# Patient Record
Sex: Female | Born: 1950 | Hispanic: No | Marital: Married | Smoking: Never smoker
Health system: Southern US, Community
[De-identification: ages and names within clinical notes are randomized; demographics above are authoritative.]

## PROBLEM LIST (undated history)

## (undated) DIAGNOSIS — I10 Essential (primary) hypertension: Secondary | ICD-10-CM

## (undated) DIAGNOSIS — M1990 Unspecified osteoarthritis, unspecified site: Secondary | ICD-10-CM

## (undated) DIAGNOSIS — J302 Other seasonal allergic rhinitis: Secondary | ICD-10-CM

## (undated) DIAGNOSIS — M8580 Other specified disorders of bone density and structure, unspecified site: Secondary | ICD-10-CM

## (undated) DIAGNOSIS — K219 Gastro-esophageal reflux disease without esophagitis: Secondary | ICD-10-CM

## (undated) HISTORY — DX: Gastro-esophageal reflux disease without esophagitis: K21.9

## (undated) HISTORY — DX: Other specified disorders of bone density and structure, unspecified site: M85.80

## (undated) HISTORY — DX: Essential (primary) hypertension: I10

## (undated) HISTORY — DX: Other seasonal allergic rhinitis: J30.2

## (undated) HISTORY — PX: TUBAL LIGATION: SHX77

## (undated) HISTORY — DX: Unspecified osteoarthritis, unspecified site: M19.90

---

## 2000-01-20 ENCOUNTER — Encounter: Payer: Self-pay | Admitting: Occupational Medicine

## 2000-01-20 ENCOUNTER — Encounter: Admission: RE | Admit: 2000-01-20 | Discharge: 2000-01-20 | Payer: Self-pay | Admitting: Occupational Medicine

## 2002-06-15 ENCOUNTER — Encounter: Payer: Self-pay | Admitting: Occupational Medicine

## 2002-06-15 ENCOUNTER — Encounter: Admission: RE | Admit: 2002-06-15 | Discharge: 2002-06-15 | Payer: Self-pay | Admitting: Occupational Medicine

## 2004-12-17 ENCOUNTER — Encounter: Admission: RE | Admit: 2004-12-17 | Discharge: 2004-12-17 | Payer: Self-pay | Admitting: Obstetrics and Gynecology

## 2006-04-06 ENCOUNTER — Encounter: Admission: RE | Admit: 2006-04-06 | Discharge: 2006-04-06 | Payer: Self-pay | Admitting: Obstetrics and Gynecology

## 2007-05-09 ENCOUNTER — Encounter: Admission: RE | Admit: 2007-05-09 | Discharge: 2007-05-09 | Payer: Self-pay | Admitting: Obstetrics and Gynecology

## 2008-04-08 ENCOUNTER — Encounter: Admission: RE | Admit: 2008-04-08 | Discharge: 2008-04-08 | Payer: Self-pay | Admitting: Family Medicine

## 2008-06-17 ENCOUNTER — Encounter: Admission: RE | Admit: 2008-06-17 | Discharge: 2008-06-17 | Payer: Self-pay | Admitting: Obstetrics and Gynecology

## 2008-08-24 DIAGNOSIS — I83813 Varicose veins of bilateral lower extremities with pain: Secondary | ICD-10-CM | POA: Insufficient documentation

## 2009-07-16 ENCOUNTER — Encounter: Admission: RE | Admit: 2009-07-16 | Discharge: 2009-07-16 | Payer: Self-pay | Admitting: Family Medicine

## 2010-08-16 ENCOUNTER — Encounter: Payer: Self-pay | Admitting: Family Medicine

## 2010-08-16 ENCOUNTER — Encounter: Payer: Self-pay | Admitting: Occupational Medicine

## 2010-08-16 ENCOUNTER — Encounter: Payer: Self-pay | Admitting: Obstetrics and Gynecology

## 2010-09-17 ENCOUNTER — Other Ambulatory Visit: Payer: Self-pay | Admitting: Family Medicine

## 2010-09-17 DIAGNOSIS — Z1231 Encounter for screening mammogram for malignant neoplasm of breast: Secondary | ICD-10-CM

## 2010-09-25 ENCOUNTER — Ambulatory Visit: Payer: Self-pay

## 2010-09-28 ENCOUNTER — Ambulatory Visit
Admission: RE | Admit: 2010-09-28 | Discharge: 2010-09-28 | Disposition: A | Discharge status: Home / Self Care | Payer: Federal, State, Local not specified - PPO | Source: Ambulatory Visit | Attending: Family Medicine | Admitting: Family Medicine

## 2010-09-28 DIAGNOSIS — Z1231 Encounter for screening mammogram for malignant neoplasm of breast: Secondary | ICD-10-CM

## 2011-07-05 ENCOUNTER — Encounter: Payer: Self-pay | Admitting: Family Medicine

## 2011-09-27 ENCOUNTER — Telehealth: Payer: Self-pay

## 2011-09-27 NOTE — Telephone Encounter (Signed)
PT IS REQUESTING A REFILL ON THE NASAL SPRAY THAT DR Hal Hope PRESCRIBED LAST YEAR. PLEASE CALL PT ON HER CELL @ [phone] [facility] [address]

## 2011-09-28 NOTE — Telephone Encounter (Signed)
LMOM THAT PT NEEDS TO RTC. PT MISSED APPT IN DEC. AND ALSO ON LAST RX SHE WAS TOLD TO MAKE APPT FOR CPE

## 2011-11-08 ENCOUNTER — Encounter: Payer: Self-pay | Admitting: Family Medicine

## 2011-11-08 ENCOUNTER — Telehealth: Payer: Self-pay

## 2011-11-08 MED ORDER — FLUTICASONE PROPIONATE 50 MCG/ACT NA SUSP: 2.0000 | Freq: Every day | NASAL | Status: DC

## 2011-11-08 NOTE — Telephone Encounter (Signed)
STOPPED BY 104. WOULD LIKE A REFILL ON FLUTICASONE, PROPIONATE NASAL SPRAY. HAS APPT TO SEE DR ON 4/29   [facility] [address]

## 2011-11-08 NOTE — Telephone Encounter (Signed)
I will refill her FLonase for her, please note what pharmacy she uses and we will eprescribe.

## 2011-11-08 NOTE — Telephone Encounter (Signed)
Please tell pt Flonase sent to her pharamcy, thanks

## 2011-11-09 NOTE — Telephone Encounter (Signed)
LMOM notifying patient.

## 2011-11-22 ENCOUNTER — Encounter: Payer: Self-pay | Admitting: Family Medicine

## 2011-11-22 ENCOUNTER — Ambulatory Visit (INDEPENDENT_AMBULATORY_CARE_PROVIDER_SITE_OTHER): Payer: Federal, State, Local not specified - PPO | Admitting: Family Medicine

## 2011-11-22 VITALS — BP 128/67 | HR 89 | Temp 98.7°F | Resp 16 | Ht 63.0 in | Wt 183.4 lb

## 2011-11-22 DIAGNOSIS — J302 Other seasonal allergic rhinitis: Secondary | ICD-10-CM

## 2011-11-22 DIAGNOSIS — H0520 Unspecified exophthalmos: Secondary | ICD-10-CM

## 2011-11-22 DIAGNOSIS — I10 Essential (primary) hypertension: Secondary | ICD-10-CM

## 2011-11-22 DIAGNOSIS — Z0000 Encounter for general adult medical examination without abnormal findings: Secondary | ICD-10-CM

## 2011-11-22 LAB — LIPID PANEL
HDL: 59 mg/dL (ref >39)
LDL Cholesterol: 75 mg/dL (ref 0–99)
Total CHOL/HDL Ratio: 2.5 Ratio
VLDL: 13 mg/dL (ref 0–40)

## 2011-11-22 LAB — COMPREHENSIVE METABOLIC PANEL
ALT: 16 U/L (ref 0–35)
AST: 18 U/L (ref 0–37)
Alkaline Phosphatase: 54 U/L (ref 39–117)
BUN: 20 mg/dL (ref 6–23)
Calcium: 9.6 mg/dL (ref 8.4–10.5)
Chloride: 104 mEq/L (ref 96–112)
Creat: 0.82 mg/dL (ref 0.50–1.10)
Total Bilirubin: 0.3 mg/dL (ref 0.3–1.2)

## 2011-11-22 LAB — CBC WITH DIFFERENTIAL/PLATELET
Basophils Absolute: 0 10*3/uL (ref 0.0–0.1)
Basophils Relative: 0 % (ref 0–1)
Eosinophils Relative: 1 % (ref 0–5)
Lymphocytes Relative: 35 % (ref 12–46)
MCHC: 33.1 g/dL (ref 30.0–36.0)
MCV: 89.8 fL (ref 78.0–100.0)
Neutro Abs: 3.5 10*3/uL (ref 1.7–7.7)
Platelets: 313 10*3/uL (ref 150–400)
RDW: 13.2 % (ref 11.5–15.5)
WBC: 6.2 10*3/uL (ref 4.0–10.5)

## 2011-11-22 LAB — TSH: TSH: 1.508 u[IU]/mL (ref 0.350–4.500)

## 2011-11-22 MED ORDER — FLUTICASONE PROPIONATE 50 MCG/ACT NA SUSP: 2.0000 | Freq: Every day | NASAL | Status: DC

## 2011-11-22 MED ORDER — LISINOPRIL-HYDROCHLOROTHIAZIDE 10-12.5 MG PO TABS: 1.0000 | ORAL_TABLET | Freq: Every day | ORAL | Status: DC

## 2011-11-22 NOTE — Progress Notes (Signed)
  Subjective:    Patient ID: Frances Hunter, female    DOB: Mar 23, 1951, 61 y.o.   MRN: [id number]  HPI Patient presents for CPE  ETD/Tinnitus- much improved with Flonase  Hypertension- compliant with medications without side effects  Varicose veins- laser procedure helping  Health Maintenance:  Pap 2012 normal Mammogram 2012 normal Colonoscopy 2010- normal   Review of Systems     Objective:   Physical Exam  Constitutional: She appears well-developed.  HENT:  Head: Normocephalic and atraumatic.  Right Ear: Hearing, tympanic membrane, external ear and ear canal normal.  Left Ear: Hearing, tympanic membrane, external ear and ear canal normal.  Nose: Mucosal edema present.  Mouth/Throat: Oropharynx is clear and moist.  Eyes: Conjunctivae and EOM are normal.  Neck: Normal range of motion. Neck supple. No thyromegaly present.  Cardiovascular: Normal rate, regular rhythm, normal heart sounds and intact distal pulses.   Pulmonary/Chest: Effort normal and breath sounds normal.  Abdominal: Soft. Bowel sounds are normal.  Musculoskeletal: Normal range of motion.  Lymphadenopathy:    She has no cervical adenopathy.  Neurological: She is alert. She displays normal reflexes. No cranial nerve deficit.  Skin: Skin is warm.  Psychiatric: She has a normal mood and affect.        Assessment & Plan:   1. Routine general medical examination at a health care facility    2. HTN (hypertension)  Continue current medications  3. Exophthalmos  TSH has remained normal  4. Seasonal allergies  fluticasone (FLONASE) 50 MCG/ACT nasal spray   Follow up 6 months Zostavax Rx

## 2011-11-25 ENCOUNTER — Other Ambulatory Visit: Payer: Self-pay | Admitting: Family Medicine

## 2011-11-25 DIAGNOSIS — Z1231 Encounter for screening mammogram for malignant neoplasm of breast: Secondary | ICD-10-CM

## 2011-11-26 ENCOUNTER — Ambulatory Visit
Admission: RE | Admit: 2011-11-26 | Discharge: 2011-11-26 | Disposition: A | Discharge status: Home / Self Care | Payer: Federal, State, Local not specified - PPO | Source: Ambulatory Visit | Attending: Family Medicine | Admitting: Family Medicine

## 2011-11-26 DIAGNOSIS — Z1231 Encounter for screening mammogram for malignant neoplasm of breast: Secondary | ICD-10-CM

## 2012-06-01 ENCOUNTER — Other Ambulatory Visit: Payer: Self-pay | Admitting: Internal Medicine

## 2012-12-10 ENCOUNTER — Ambulatory Visit (INDEPENDENT_AMBULATORY_CARE_PROVIDER_SITE_OTHER): Payer: Federal, State, Local not specified - PPO | Admitting: Family Medicine

## 2012-12-10 ENCOUNTER — Telehealth: Payer: Self-pay

## 2012-12-10 VITALS — BP 142/76 | HR 89 | Temp 98.4°F | Resp 16 | Ht 63.0 in | Wt 193.4 lb

## 2012-12-10 DIAGNOSIS — J302 Other seasonal allergic rhinitis: Secondary | ICD-10-CM | POA: Insufficient documentation

## 2012-12-10 DIAGNOSIS — I10 Essential (primary) hypertension: Secondary | ICD-10-CM | POA: Insufficient documentation

## 2012-12-10 DIAGNOSIS — J309 Allergic rhinitis, unspecified: Secondary | ICD-10-CM

## 2012-12-10 DIAGNOSIS — K219 Gastro-esophageal reflux disease without esophagitis: Secondary | ICD-10-CM

## 2012-12-10 MED ORDER — OMEPRAZOLE 40 MG PO CPDR: 40.0000 mg | DELAYED_RELEASE_CAPSULE | Freq: Every day | ORAL | Status: DC

## 2012-12-10 MED ORDER — FLUTICASONE PROPIONATE 50 MCG/ACT NA SUSP: NASAL | Status: DC

## 2012-12-10 MED ORDER — LISINOPRIL-HYDROCHLOROTHIAZIDE 10-12.5 MG PO TABS: 1.0000 | ORAL_TABLET | Freq: Every day | ORAL | Status: DC

## 2012-12-10 NOTE — Progress Notes (Signed)
  Subjective:    Patient ID: Frances Hunter, female    DOB: Mar 01, 1951, 62 y.o.   MRN: [id number] Chief Complaint  Patient presents with  . Medication Refill   HPI  Ran out of BP meds 3-4d ago - was out of town last week so that's why she did not come in until now.  Does not check BP outside of office.    Needs to get physical and plans to schedule soon.  Gets her primary care here - was seeing Dr. Francine Graven  Is noticing some metallic taste in her mouth, taking some otc meds for heartburn which helps a little.  Using flonase - works really well for her allergies including stuffy nose and scratchy throat.  Past Medical History  Diagnosis Date  . Allergy   . Hypertension    No current outpatient prescriptions on file prior to visit.   No current facility-administered medications on file prior to visit.   No Known Allergies   Review of Systems  Constitutional: Negative for fever, chills, diaphoresis, activity change and appetite change.  Eyes: Negative for visual disturbance.  Respiratory: Negative for cough and shortness of breath.   Cardiovascular: Negative for chest pain, palpitations and leg swelling.  Genitourinary: Negative for decreased urine volume.  Neurological: Negative for syncope and headaches.  Hematological: Does not bruise/bleed easily.      BP 142/76  Pulse 89  Temp(Src) 98.4 F (36.9 C) (Oral)  Resp 16  Ht 5\' 3"  (1.6 m)  Wt 193 lb 6.4 oz (87.726 kg)  BMI 34.27 kg/m2  SpO2 98% Objective:   Physical Exam  Constitutional: She is oriented to person, place, and time. She appears well-developed and well-nourished. No distress.  HENT:  Head: Normocephalic and atraumatic.  Right Ear: External ear normal.  Left Ear: External ear normal.  Eyes: Conjunctivae are normal. No scleral icterus.  Neck: Normal range of motion. Neck supple. No thyromegaly present.  Cardiovascular: Normal rate, regular rhythm, normal heart sounds and intact distal pulses.    Pulmonary/Chest: Effort normal and breath sounds normal. No respiratory distress.  Musculoskeletal: She exhibits no edema.  Lymphadenopathy:    She has no cervical adenopathy.  Neurological: She is alert and oriented to person, place, and time.  Skin: Skin is warm and dry. She is not diaphoretic. No erythema.  Psychiatric: She has a normal mood and affect. Her behavior is normal.      Assessment & Plan:  GERD (gastroesophageal reflux disease) - start ppi x 4-6 wks then stop  HTN - restart lisinopril-hctz - recheck in 3 mos w/ bmp at that time  Allergic rhinitis - refilled flonase  HM - sched CPE to establish w/ me in 3 mos.  Meds ordered this encounter  Medications  . lisinopril-hydrochlorothiazide (PRINZIDE,ZESTORETIC) 10-12.5 MG per tablet    Sig: Take 1 tablet by mouth daily.    Dispense:  90 tablet    Refill:  1  . omeprazole (PRILOSEC) 40 MG capsule    Sig: Take 1 capsule (40 mg total) by mouth daily.    Dispense:  30 capsule    Refill:  1  . fluticasone (FLONASE) 50 MCG/ACT nasal spray    Sig: PLACE 2 SPRAYS INTO THE NOSE DAILY.    Dispense:  16 g    Refill:  6

## 2013-01-05 ENCOUNTER — Other Ambulatory Visit: Payer: Self-pay

## 2013-01-05 DIAGNOSIS — Z1231 Encounter for screening mammogram for malignant neoplasm of breast: Secondary | ICD-10-CM

## 2013-02-05 ENCOUNTER — Ambulatory Visit
Admission: RE | Admit: 2013-02-05 | Discharge: 2013-02-05 | Disposition: A | Discharge status: Home / Self Care | Payer: Federal, State, Local not specified - PPO | Source: Ambulatory Visit

## 2013-02-05 DIAGNOSIS — Z1231 Encounter for screening mammogram for malignant neoplasm of breast: Secondary | ICD-10-CM

## 2013-02-23 ENCOUNTER — Ambulatory Visit (INDEPENDENT_AMBULATORY_CARE_PROVIDER_SITE_OTHER): Payer: Federal, State, Local not specified - PPO | Admitting: Family Medicine

## 2013-02-23 ENCOUNTER — Encounter: Payer: Self-pay | Admitting: Family Medicine

## 2013-02-23 VITALS — BP 121/70 | HR 110 | Temp 98.2°F | Resp 18 | Ht 63.5 in | Wt 187.0 lb

## 2013-02-23 DIAGNOSIS — Z0000 Encounter for general adult medical examination without abnormal findings: Secondary | ICD-10-CM

## 2013-02-23 DIAGNOSIS — I10 Essential (primary) hypertension: Secondary | ICD-10-CM

## 2013-02-23 LAB — POCT URINALYSIS DIPSTICK
Leukocytes, UA: NEGATIVE
Nitrite, UA: NEGATIVE
Protein, UA: NEGATIVE
Urobilinogen, UA: 0.2
pH, UA: 7.5

## 2013-02-23 LAB — COMPREHENSIVE METABOLIC PANEL
Albumin: 4.2 g/dL (ref 3.5–5.2)
BUN: 14 mg/dL (ref 6–23)
CO2: 32 mEq/L (ref 19–32)
Glucose, Bld: 80 mg/dL (ref 70–99)
Potassium: 3.9 mEq/L (ref 3.5–5.3)
Sodium: 140 mEq/L (ref 135–145)
Total Bilirubin: 0.4 mg/dL (ref 0.3–1.2)
Total Protein: 7.2 g/dL (ref 6.0–8.3)

## 2013-02-23 LAB — POCT WET PREP WITH KOH
KOH Prep POC: NEGATIVE
Trichomonas, UA: NEGATIVE

## 2013-02-23 LAB — CBC WITH DIFFERENTIAL/PLATELET
Basophils Relative: 1 % (ref 0–1)
Eosinophils Absolute: 0 10*3/uL (ref 0.0–0.7)
HCT: 38.3 % (ref 36.0–46.0)
Hemoglobin: 12.9 g/dL (ref 12.0–15.0)
Lymphs Abs: 2 10*3/uL (ref 0.7–4.0)
MCH: 29.8 pg (ref 26.0–34.0)
MCHC: 33.7 g/dL (ref 30.0–36.0)
Monocytes Absolute: 0.4 10*3/uL (ref 0.1–1.0)
Monocytes Relative: 7 % (ref 3–12)
Neutrophils Relative %: 58 % (ref 43–77)
RBC: 4.33 MIL/uL (ref 3.87–5.11)

## 2013-02-23 LAB — IFOBT (OCCULT BLOOD): IFOBT: NEGATIVE

## 2013-02-23 MED ORDER — LISINOPRIL-HYDROCHLOROTHIAZIDE 10-12.5 MG PO TABS: 1.0000 | ORAL_TABLET | Freq: Every day | ORAL | Status: DC

## 2013-02-23 MED ORDER — OMEPRAZOLE 40 MG PO CPDR: 40.0000 mg | DELAYED_RELEASE_CAPSULE | Freq: Every day | ORAL | Status: DC

## 2013-02-23 NOTE — Progress Notes (Signed)
Subjective:    Patient ID: Frances Hunter, female    DOB: 1950-10-20, 62 y.o.   MRN: [id number] Chief Complaint  Patient presents with  . Annual Exam  . Gynecologic Exam     HPI  Taking 1 a day older women vitamins daily.  Tdap 2008  Past Medical History  Diagnosis Date  . Allergy   . Hypertension    Current Outpatient Prescriptions on File Prior to Visit  Medication Sig Dispense Refill  . fluticasone (FLONASE) 50 MCG/ACT nasal spray PLACE 2 SPRAYS INTO THE NOSE DAILY.  16 g  6   No current facility-administered medications on file prior to visit.   No Known Allergies History reviewed. No pertinent past surgical history. History   Social History  . Marital Status: Married    Spouse Name: N/A    Number of Children: N/A  . Years of Education: N/A   Social History Main Topics  . Smoking status: Never Smoker   . Smokeless tobacco: None  . Alcohol Use: 1.2 oz/week    2 Glasses of wine per week  . Drug Use: No  . Sexual Activity: Yes   Other Topics Concern  . None   Social History Narrative  . None   Family History  Problem Relation Age of Onset  . Cancer Father 2    lymphoma  . Diabetes Brother   . Diabetes Maternal Grandfather   . Cancer Paternal Grandfather   . Liver disease Mother 28    cyst     Review of Systems  HENT: Positive for neck stiffness and tinnitus.   Hematological: Bruises/bleeds easily.  All other systems reviewed and are negative.      BP 121/70  Pulse 110  Temp(Src) 98.2 F (36.8 C)  Resp 18  Ht 5' 3.5" (1.613 m)  Wt 187 lb (84.823 kg)  BMI 32.6 kg/m2 Objective:   Physical Exam  Constitutional: She is oriented to person, place, and time. She appears well-developed and well-nourished. No distress.  HENT:  Head: Normocephalic and atraumatic.  Right Ear: Tympanic membrane, external ear and ear canal normal.  Left Ear: Tympanic membrane, external ear and ear canal normal.  Nose: No rhinorrhea.  Mouth/Throat: Uvula is  midline, oropharynx is clear and moist and mucous membranes are normal. No posterior oropharyngeal erythema.  Eyes: Conjunctivae and EOM are normal. Pupils are equal, round, and reactive to light. Right eye exhibits no discharge. Left eye exhibits no discharge. No scleral icterus.  Neck: Normal range of motion. Neck supple. No thyromegaly present.  Cardiovascular: Normal rate, regular rhythm, normal heart sounds and intact distal pulses.   Pulmonary/Chest: Effort normal and breath sounds normal. No respiratory distress.  Abdominal: Soft. Bowel sounds are normal. There is no tenderness.  Genitourinary: Vagina normal and uterus normal. No breast swelling, tenderness, discharge or bleeding. Cervix exhibits no motion tenderness and no friability. Right adnexum displays no mass and no tenderness. Left adnexum displays no mass and no tenderness.  Musculoskeletal: She exhibits no edema.  Lymphadenopathy:    She has no cervical adenopathy.  Neurological: She is alert and oriented to person, place, and time. She has normal reflexes.  Skin: Skin is warm and dry. She is not diaphoretic. No erythema.  Psychiatric: She has a normal mood and affect. Her behavior is normal.          Results for orders placed in visit on 02/23/13  CBC WITH DIFFERENTIAL      Result Value Range   WBC  5.9  4.0 - 10.5 K/uL   RBC 4.33  3.87 - 5.11 MIL/uL   Hemoglobin 12.9  12.0 - 15.0 g/dL   HCT 16.1  09.6 - 04.5 %   MCV 88.5  78.0 - 100.0 fL   MCH 29.8  26.0 - 34.0 pg   MCHC 33.7  30.0 - 36.0 g/dL   RDW 40.9  81.1 - 91.4 %   Platelets 349  150 - 400 K/uL   Neutrophils Relative % 58  43 - 77 %   Neutro Abs 3.4  1.7 - 7.7 K/uL   Lymphocytes Relative 33  12 - 46 %   Lymphs Abs 2.0  0.7 - 4.0 K/uL   Monocytes Relative 7  3 - 12 %   Monocytes Absolute 0.4  0.1 - 1.0 K/uL   Eosinophils Relative 1  0 - 5 %   Eosinophils Absolute 0.0  0.0 - 0.7 K/uL   Basophils Relative 1  0 - 1 %   Basophils Absolute 0.0  0.0 - 0.1 K/uL    Smear Review Criteria for review not met    COMPREHENSIVE METABOLIC PANEL      Result Value Range   Sodium 140  135 - 145 mEq/L   Potassium 3.9  3.5 - 5.3 mEq/L   Chloride 103  96 - 112 mEq/L   CO2 32  19 - 32 mEq/L   Glucose, Bld 80  70 - 99 mg/dL   BUN 14  6 - 23 mg/dL   Creat 7.82  9.56 - 2.13 mg/dL   Total Bilirubin 0.4  0.3 - 1.2 mg/dL   Alkaline Phosphatase 63  39 - 117 U/L   AST 16  0 - 37 U/L   ALT 16  0 - 35 U/L   Total Protein 7.2  6.0 - 8.3 g/dL   Albumin 4.2  3.5 - 5.2 g/dL   Calcium 9.7  8.4 - 08.6 mg/dL  POCT URINALYSIS DIPSTICK      Result Value Range   Color, UA yellow     Clarity, UA clear     Glucose, UA neg     Bilirubin, UA neg     Ketones, UA neg     Spec Grav, UA 1.015     Blood, UA trace     pH, UA 7.5     Protein, UA neg     Urobilinogen, UA 0.2     Nitrite, UA neg     Leukocytes, UA Negative    IFOBT (OCCULT BLOOD)      Result Value Range   IFOBT Negative    POCT WET PREP WITH KOH      Result Value Range   Trichomonas, UA Negative     Clue Cells Wet Prep HPF POC 0-14     Epithelial Wet Prep HPF POC 6-tntc     Yeast Wet Prep HPF POC neg     Bacteria Wet Prep HPF POC 2+     RBC Wet Prep HPF POC 0-6     WBC Wet Prep HPF POC 13-tntc     KOH Prep POC Negative    PAP IG W/ RFLX HPV ASCU      Result Value Range   Specimen adequacy:       FINAL DIAGNOSIS:       COMMENTS:       Cytotechnologist:        Assessment & Plan:  Annual physical exam - Plan: POCT urinalysis dipstick, Pap IG  w/ reflex to HPV when ASC-U, CBC with Differential, Comprehensive metabolic panel, IFOBT POC (occult bld, rslt in office), POCT Wet Prep with KOH  Essential hypertension, benign - Plan: Comprehensive metabolic panel  Meds ordered this encounter  Medications  . lisinopril-hydrochlorothiazide (PRINZIDE,ZESTORETIC) 10-12.5 MG per tablet    Sig: Take 1 tablet by mouth daily.    Dispense:  90 tablet    Refill:  3  . omeprazole (PRILOSEC) 40 MG capsule    Sig:  Take 1 capsule (40 mg total) by mouth daily.    Dispense:  30 capsule    Refill:  1

## 2013-02-23 NOTE — Patient Instructions (Addendum)

## 2013-02-26 LAB — PAP IG W/ RFLX HPV ASCU

## 2013-03-05 ENCOUNTER — Encounter: Payer: Self-pay | Admitting: Family Medicine

## 2014-01-25 ENCOUNTER — Ambulatory Visit (INDEPENDENT_AMBULATORY_CARE_PROVIDER_SITE_OTHER): Payer: Federal, State, Local not specified - PPO | Admitting: Emergency Medicine

## 2014-01-25 VITALS — BP 138/70 | HR 94 | Temp 98.2°F | Resp 18 | Ht 62.5 in | Wt 188.8 lb

## 2014-01-25 DIAGNOSIS — M545 Low back pain, unspecified: Secondary | ICD-10-CM

## 2014-01-25 DIAGNOSIS — R1013 Epigastric pain: Secondary | ICD-10-CM

## 2014-01-25 DIAGNOSIS — N3000 Acute cystitis without hematuria: Secondary | ICD-10-CM

## 2014-01-25 LAB — POCT URINALYSIS DIPSTICK
BILIRUBIN UA: NEGATIVE
GLUCOSE UA: NEGATIVE
Ketones, UA: NEGATIVE
NITRITE UA: NEGATIVE
Protein, UA: NEGATIVE
Spec Grav, UA: <=1.005
UROBILINOGEN UA: 0.2
pH, UA: 6.5

## 2014-01-25 LAB — POCT UA - MICROSCOPIC ONLY
Bacteria, U Microscopic: NEGATIVE
Casts, Ur, LPF, POC: NEGATIVE
Crystals, Ur, HPF, POC: NEGATIVE
Mucus, UA: NEGATIVE
YEAST UA: NEGATIVE

## 2014-01-25 MED ORDER — PHENAZOPYRIDINE HCL 200 MG PO TABS: 200.0000 mg | ORAL_TABLET | Freq: Three times a day (TID) | ORAL | Status: DC | PRN

## 2014-01-25 MED ORDER — SULFAMETHOXAZOLE-TMP DS 800-160 MG PO TABS: 1.0000 | ORAL_TABLET | Freq: Two times a day (BID) | ORAL | Status: DC

## 2014-01-25 NOTE — Progress Notes (Signed)
[facility] [address], [place] [address] [phone]  Date:  01/25/2014   Name:  Frances Hunter   DOB:  02/25/1951   MRN:  [id number]  PCP:  Norberto SorensonSHAW,EVA, MD    Chief Complaint: Bloated, Back Pain and Gastrophageal Reflux   History of Present Illness:  Frances Hunter is a 63 y.o. very pleasant female patient who presents with the following:  Patient has concerns that her GERD is acting up.  She says she has low back pain and lower abdominal pain. She has no dysuria, urgency, or frequency. No nausea or vomiting, stool change, food intolerance.  She describes a feeling of bloating.  No gyn symptoms.  Post menopausal.  The patient has no complaint of blood, mucous, or pus in her stools. No black stools or vomiting blood. She does have some symptoms of waterbrash.  Takes her medication daily. No improvement with over the counter medications or other home remedies. Denies other complaint or health concern today.   Patient Active Problem List   Diagnosis Date Noted  . Essential hypertension, benign 12/10/2012  . Allergic rhinitis 12/10/2012  . GERD (gastroesophageal reflux disease) 12/10/2012    Past Medical History  Diagnosis Date  . Allergy   . Hypertension     History reviewed. No pertinent past surgical history.  History  Substance Use Topics  . Smoking status: Never Smoker   . Smokeless tobacco: Not on file  . Alcohol Use: 1.2 oz/week    2 Glasses of wine per week    Family History  Problem Relation Age of Onset  . Cancer Father 2353    lymphoma  . Diabetes Brother   . Diabetes Maternal Grandfather   . Cancer Paternal Grandfather   . Liver disease Mother [id number]    cyst    No Known Allergies  Medication list has been reviewed and updated.  Current Outpatient Prescriptions on File Prior to Visit  Medication Sig Dispense Refill  . fluticasone (FLONASE) 50 MCG/ACT nasal spray PLACE 2 SPRAYS INTO THE NOSE DAILY.  16 g  6  .  lisinopril-hydrochlorothiazide (PRINZIDE,ZESTORETIC) 10-12.5 MG per tablet Take 1 tablet by mouth daily.  90 tablet  3  . omeprazole (PRILOSEC) 40 MG capsule Take 1 capsule (40 mg total) by mouth daily.  30 capsule  1   No current facility-administered medications on file prior to visit.    Review of Systems:  As per HPI, otherwise negative.    Physical Examination: Filed Vitals:   01/25/14 1626  BP: 138/70  Pulse: 94  Temp: 98.2 F (36.8 C)  Resp: 18   Filed Vitals:   01/25/14 1626  Height: 5' 2.5" (1.588 m)  Weight: 188 lb 12.8 oz (85.639 kg)   Body mass index is 33.96 kg/(m^2). Ideal Body Weight: Weight in (lb) to have BMI = 25: 138.6  GEN: WDWN, NAD, Non-toxic, A & O x 3 HEENT: Atraumatic, Normocephalic. Neck supple. No masses, No LAD. Ears and Nose: No external deformity. CV: RRR, No M/G/R. No JVD. No thrill. No extra heart sounds. PULM: CTA B, no wheezes, crackles, rhonchi. No retractions. No resp. distress. No accessory muscle use. ABD: S, NT, ND, +BS. No rebound. No HSM. EXTR: No c/c/e NEURO Normal gait.  PSYCH: Normally interactive. Conversant. Not depressed or anxious appearing.  Calm demeanor.    Assessment and Plan: Cystitis Pyridium Septra  Signed,  Phillips OdorJeffery Zandria Woldt, MD   Results for orders placed in visit on 01/25/14  POCT UA - MICROSCOPIC ONLY      Result Value Ref Range   WBC, Ur, HPF, POC 0-3     RBC, urine, microscopic 0-2     Bacteria, U Microscopic neg     Mucus, UA neg     Epithelial cells, urine per micros 0-1     Crystals, Ur, HPF, POC neg     Casts, Ur, LPF, POC neg     Yeast, UA neg    POCT URINALYSIS DIPSTICK      Result Value Ref Range   Color, UA yellow     Clarity, UA clear     Glucose, UA neg     Bilirubin, UA neg     Ketones, UA neg     Spec Grav, UA <=1.005     Blood, UA trace     pH, UA 6.5     Protein, UA neg     Urobilinogen, UA 0.2     Nitrite, UA neg     Leukocytes, UA small (1+)

## 2014-01-25 NOTE — Patient Instructions (Signed)

## 2014-01-26 LAB — COMPREHENSIVE METABOLIC PANEL
ALBUMIN: 4.2 g/dL (ref 3.5–5.2)
ALK PHOS: 53 U/L (ref 39–117)
ALT: 18 U/L (ref 0–35)
AST: 16 U/L (ref 0–37)
BUN: 15 mg/dL (ref 6–23)
CALCIUM: 9.7 mg/dL (ref 8.4–10.5)
CHLORIDE: 102 meq/L (ref 96–112)
CO2: 31 mEq/L (ref 19–32)
CREATININE: 0.85 mg/dL (ref 0.50–1.10)
Glucose, Bld: 86 mg/dL (ref 70–99)
POTASSIUM: 3.4 meq/L — AB (ref 3.5–5.3)
Sodium: 141 mEq/L (ref 135–145)
Total Bilirubin: 0.4 mg/dL (ref 0.2–1.2)
Total Protein: 7.2 g/dL (ref 6.0–8.3)

## 2014-01-26 LAB — CBC
HCT: 37.9 % (ref 36.0–46.0)
Hemoglobin: 12.8 g/dL (ref 12.0–15.0)
MCH: 29.4 pg (ref 26.0–34.0)
MCHC: 33.8 g/dL (ref 30.0–36.0)
MCV: 86.9 fL (ref 78.0–100.0)
PLATELETS: 289 10*3/uL (ref 150–400)
RBC: 4.36 MIL/uL (ref 3.87–5.11)
RDW: 13.7 % (ref 11.5–15.5)
WBC: 6.7 10*3/uL (ref 4.0–10.5)

## 2014-01-26 LAB — AMYLASE: Amylase: 59 U/L (ref 0–105)

## 2014-01-28 LAB — H. PYLORI ANTIBODY, IGG: H Pylori IgG: <0.4 {ISR}

## 2014-03-05 ENCOUNTER — Other Ambulatory Visit: Payer: Self-pay | Admitting: Family Medicine

## 2014-04-10 ENCOUNTER — Telehealth: Payer: Self-pay

## 2014-04-10 MED ORDER — LISINOPRIL-HYDROCHLOROTHIAZIDE 10-12.5 MG PO TABS: 1.0000 | ORAL_TABLET | Freq: Every day | ORAL | Status: DC

## 2014-04-10 NOTE — Telephone Encounter (Signed)
Pt advised refill sent to the pharmacy. 

## 2014-04-10 NOTE — Telephone Encounter (Signed)
Patient came into the appointment center stated she need a refill on Lisinopril 10-12.5 MG until September 23. She has a scheduled appointment with Dr. Clelia Croft on 05-17-14. Please call patient when prescription is sent over to pharmacy. [phone]

## 2014-05-06 ENCOUNTER — Other Ambulatory Visit: Payer: Self-pay

## 2014-05-06 DIAGNOSIS — Z1239 Encounter for other screening for malignant neoplasm of breast: Secondary | ICD-10-CM

## 2014-05-07 ENCOUNTER — Other Ambulatory Visit: Payer: Self-pay | Admitting: Physician Assistant

## 2014-05-14 ENCOUNTER — Other Ambulatory Visit: Payer: Self-pay

## 2014-05-14 DIAGNOSIS — Z1231 Encounter for screening mammogram for malignant neoplasm of breast: Secondary | ICD-10-CM

## 2014-05-16 ENCOUNTER — Ambulatory Visit
Admission: RE | Admit: 2014-05-16 | Discharge: 2014-05-16 | Disposition: A | Discharge status: Home / Self Care | Payer: Federal, State, Local not specified - PPO | Source: Ambulatory Visit

## 2014-05-16 DIAGNOSIS — Z1231 Encounter for screening mammogram for malignant neoplasm of breast: Secondary | ICD-10-CM

## 2014-05-17 ENCOUNTER — Encounter: Payer: Self-pay | Admitting: Family Medicine

## 2014-05-17 ENCOUNTER — Ambulatory Visit (INDEPENDENT_AMBULATORY_CARE_PROVIDER_SITE_OTHER): Payer: Federal, State, Local not specified - PPO | Admitting: Family Medicine

## 2014-05-17 VITALS — BP 132/70 | HR 87 | Temp 98.1°F | Resp 16 | Ht 63.5 in | Wt 186.2 lb

## 2014-05-17 DIAGNOSIS — I10 Essential (primary) hypertension: Secondary | ICD-10-CM

## 2014-05-17 DIAGNOSIS — Z0000 Encounter for general adult medical examination without abnormal findings: Secondary | ICD-10-CM

## 2014-05-17 DIAGNOSIS — Z13820 Encounter for screening for osteoporosis: Secondary | ICD-10-CM

## 2014-05-17 DIAGNOSIS — Z1389 Encounter for screening for other disorder: Secondary | ICD-10-CM

## 2014-05-17 DIAGNOSIS — Z1329 Encounter for screening for other suspected endocrine disorder: Secondary | ICD-10-CM

## 2014-05-17 DIAGNOSIS — Z23 Encounter for immunization: Secondary | ICD-10-CM

## 2014-05-17 DIAGNOSIS — Z13220 Encounter for screening for lipoid disorders: Secondary | ICD-10-CM

## 2014-05-17 LAB — POCT URINALYSIS DIPSTICK
Bilirubin, UA: NEGATIVE
Glucose, UA: NEGATIVE
Ketones, UA: NEGATIVE
NITRITE UA: NEGATIVE
Protein, UA: NEGATIVE
Spec Grav, UA: <=1.005
Urobilinogen, UA: 0.2
pH, UA: 6

## 2014-05-17 LAB — COMPLETE METABOLIC PANEL WITH GFR
ALK PHOS: 64 U/L (ref 39–117)
ALT: 21 U/L (ref 0–35)
AST: 21 U/L (ref 0–37)
Albumin: 4.3 g/dL (ref 3.5–5.2)
BILIRUBIN TOTAL: 0.5 mg/dL (ref 0.2–1.2)
BUN: 20 mg/dL (ref 6–23)
CO2: 29 meq/L (ref 19–32)
CREATININE: 0.82 mg/dL (ref 0.50–1.10)
Calcium: 9.8 mg/dL (ref 8.4–10.5)
Chloride: 99 mEq/L (ref 96–112)
GFR, EST AFRICAN AMERICAN: 88 mL/min
GFR, Est Non African American: 76 mL/min
Glucose, Bld: 74 mg/dL (ref 70–99)
Potassium: 4.6 mEq/L (ref 3.5–5.3)
Sodium: 139 mEq/L (ref 135–145)
Total Protein: 7.5 g/dL (ref 6.0–8.3)

## 2014-05-17 LAB — LIPID PANEL
CHOLESTEROL: 138 mg/dL (ref 0–200)
HDL: 59 mg/dL (ref >39)
LDL Cholesterol: 68 mg/dL (ref 0–99)
Total CHOL/HDL Ratio: 2.3 Ratio
Triglycerides: 56 mg/dL (ref <150)
VLDL: 11 mg/dL (ref 0–40)

## 2014-05-17 MED ORDER — ZOSTER VACCINE LIVE 19400 UNT/0.65ML ~~LOC~~ SOLR: 0.6500 mL | Freq: Once | SUBCUTANEOUS | Status: DC

## 2014-05-17 MED ORDER — OMEPRAZOLE 40 MG PO CPDR: 40.0000 mg | DELAYED_RELEASE_CAPSULE | Freq: Every day | ORAL | Status: DC

## 2014-05-17 MED ORDER — LISINOPRIL-HYDROCHLOROTHIAZIDE 10-12.5 MG PO TABS: 1.0000 | ORAL_TABLET | Freq: Every day | ORAL | Status: DC

## 2014-05-17 MED ORDER — FLUTICASONE PROPIONATE 50 MCG/ACT NA SUSP: NASAL | Status: DC

## 2014-05-17 NOTE — Patient Instructions (Signed)
We will follow up with you regarding the results.

## 2014-05-17 NOTE — Progress Notes (Signed)
Subjective:    Patient ID: Cleophus Moltervolia R Balazs, female    DOB: 08/30/1950, 63 y.o.   MRN: [id number]  Chief Complaint  Patient presents with  . Annual Exam    no pap    HPI Past Medical History  Diagnosis Date  . Allergy   . Hypertension      No Known Allergies Current Outpatient Prescriptions on File Prior to Visit  Medication Sig Dispense Refill  . fluticasone (FLONASE) 50 MCG/ACT nasal spray PLACE 2 SPRAYS INTO THE NOSE DAILY.  16 g  6  . lisinopril-hydrochlorothiazide (PRINZIDE,ZESTORETIC) 10-12.5 MG per tablet TAKE 1 TABLET BY MOUTH DAILY. PATIENT NEEDS CPE/BP CHECK UP FOR ADDITIONAL REFILLS  15 tablet  0  . omeprazole (PRILOSEC) 40 MG capsule Take 1 capsule (40 mg total) by mouth daily.  30 capsule  1  . phenazopyridine (PYRIDIUM) 200 MG tablet Take 1 tablet (200 mg total) by mouth 3 (three) times daily as needed.  6 tablet  0  . sulfamethoxazole-trimethoprim (BACTRIM DS) 800-160 MG per tablet Take 1 tablet by mouth 2 (two) times daily.  20 tablet  0   No current facility-administered medications on file prior to visit.   Last Eye Exam 05/10/2014 Mammogram 05/16/2014 Colonoscopy 2009-Reported all normal findings,  Vascular Doctor for lower extremity 05/27/2014 Influenza Vaccine 05/17/2014  HPI Comments: Cleophus Moltervolia R Sauve is a 63 y.o. female who [facility].  She has no complaints or concerns at this time.    In a ROS, she has a few aches and pains, but attributes this to age.  She takes ibuprofen, which resolves the symptoms.  Usually occur in the morning and resolve with time.  She rarely may have lightheadedness with bending forward then arising very fast.  It resolves in seconds.  She denies blurriness, palpitations, or syncopal events.    HTN: Takes medication as prescribed.  She may take her BP about bi-weekly to monthly at her workplace.  She says they are never above 135/70.  She denies chest pain, palpitations, or leg swelling.    She  takes vitamin D and calcium supplements in addition to her prescribed medications.    Allergies: Flonase resolves any flare ups.    Past Medical History  Diagnosis Date  . Allergy   . Hypertension    History reviewed. No pertinent past surgical history.  History   Social History  . Marital Status: Married    Spouse Name: N/A    Number of Children: N/A  . Years of Education: N/A   Occupational History  . Not on file.   Social History Main Topics  . Smoking status: Never Smoker   . Smokeless tobacco: Not on file  . Alcohol Use: 1.2 oz/week    2 Glasses of wine per week  . Drug Use: No  . Sexual Activity: Yes   Other Topics Concern  . Not on file   Social History Narrative  . No narrative on file    Review of Systems  Respiratory: Negative for cough   ), shortness of breath and wheezing.   Cardiovascular: Negative for chest pain, palpitations and leg swelling.  Gastrointestinal: Negative for constipation and blood in stool.  Genitourinary: Negative for dysuria and pelvic pain.  No abnormal vaginal discharge, odor, or dyspareunia.   Review of Systems  Constitutional: Negative for fever, chills and malaise/fatigue.  Eyes: Positive for redness (attributes this to lack of sleep and allergies.). Negative for blurred vision and pain.  Respiratory: Positive for cough (mild cough this week.  felt it was post nasal drip.  Took flonase, which resolved symptoms.. Negative for shortness of breath and wheezing.   Cardiovascular: Negative for chest pain, palpitations and leg swelling.  Gastrointestinal: Negative for nausea, abdominal pain, diarrhea, constipation and blood in stool.  Genitourinary: Negative for dysuria.  Musculoskeletal: Positive for back pain. Negative for joint pain.  Skin: Negative for rash.  Neurological: Positive for dizziness. Negative for tingling, weakness and headaches.  Psychiatric/Behavioral: Negative for substance abuse.       Objective:  BP 132/70   Pulse 87  Temp(Src) 98.1 F (36.7 C) (Oral)  Resp 16  Ht 5' 3.5" (1.613 m)  Wt 186 lb 3.2 oz (84.46 kg)  BMI 32.46 kg/m2  SpO2 98%  Physical Exam  Nursing note and vitals reviewed. Constitutional: She is oriented to person, place, and time. She appears well-developed and well-nourished.  HENT: Bilaterally injected conjunctiva.  Minimal rhinorrhea, no mucosal edema.  Normal oropharyngeal mucosa with no erythema or edema.  No preauricular, postauricular, submandibular, or cervical adenopathy appreciated.   Head: Normocephalic and atraumatic.  Eyes: Pupils are equal, round, and reactive to light. Normal ocular movement.  No abnormalities appreciated with fundoscopic exam.   Neck: No JVD present. No thyromegaly.  Supple. Cardiovascular: Normal rate and regular rhythm.  No murmurs, gallops or rubs.     Abdomen: Soft with normal bowel sounds.  No masses, guarding, or tenderness with palpation. Pulmonary/Chest: Effort normal and breath sounds normal. No respiratory distress. No rales rhonchi, or crackles.   Musculature: Normal ROM and strength of extremities.  Normal tone.   Extremities: Varicose veins and diffusely scattered lower extremity telangectasia.  Normal distal pulses.  Lower extremities have no pallor, coolness, or edema.     Neurological: She is alert and oriented to person, place, and time.  II-XII Cranial nerves intact.  Normal gait, coordination, and negative Romberg.  Appropriate affect, judgment, memory, and speech.   Normal reflexes throughout.    Skin: Skin is warm and dry.  No rash or erythema.   Psychiatric: She has a normal mood and affect. Her behavior is normal.  Normal memory, speech, judgment, and cognition.     Physical Exam  Results for orders placed in visit on 05/17/14  POCT URINALYSIS DIPSTICK      Result Value Ref Range   Color, UA yellow     Clarity, UA clear     Glucose, UA neg     Bilirubin, UA neg     Ketones, UA neg     Spec Grav, UA <=1.005      Blood, UA trace-lysed     pH, UA 6.0     Protein, UA neg     Urobilinogen, UA 0.2     Nitrite, UA neg     Leukocytes, UA Trace          Assessment & Plan:  This is a 63 year old female with a history of HTN and allergies here for annual exam.  She presents both in history and physical as very healthy and UTD on vaccinations and preventive examinations.  We have ordered exams, and will follow up with results.    Annual physical exam 01/25/2014 CMET showed a slightly decreased potassium.  Possibly due to the anti-hypertensives.  CMET reordered--will follow up regarding results.    Vit D  25 hydroxy (rtn osteoporosis monitoring) Lipid panel TSH UA-Normal  UA-Trace blood found.  She does use wipes along  the vaginal wall, which may have caused this finding.  She will need to recheck at next office visit.   Need for prophylactic vaccination and inoculation against influenza  Flu Vaccine QUAD 36+ mos IM  Need for shingles vaccine  zoster vaccine live, PF, (ZOSTAVAX) 46962 UNT/0.65ML injection,   Trena Platt, PA-C [facility] 10/23/201512:25 PM

## 2014-05-18 LAB — VITAMIN D 25 HYDROXY (VIT D DEFICIENCY, FRACTURES): Vit D, 25-Hydroxy: 44 ng/mL (ref 30–89)

## 2014-05-18 LAB — TSH: TSH: 2.605 u[IU]/mL (ref 0.350–4.500)

## 2014-05-18 NOTE — Progress Notes (Signed)
Need for prophylactic vaccination and inoculation against influenza - Plan: Flu Vaccine QUAD 36+ mos IM  Screening for osteoporosis - Plan: Vit D  25 hydroxy (rtn osteoporosis monitoring)  Screening for hyperlipidemia - Plan: Lipid panel  Screening for thyroid disorder - Plan: TSH  Screening for nephropathy - Plan: POCT urinalysis dipstick  Essential hypertension, benign - Plan: COMPLETE METABOLIC PANEL WITH GFR - very well controlled on current regimen, meds refilled, recheck in 1 yr  Annual physical exam - mammogram yesterday normal, colonoscopy due 2019, rec shingles vaccine and flu shot today, start calcium and vitamin D supplement, pap negative/normal 02/2013 so repeat x 1 at 63 yo then no further testing needed. tdap done in 2008 - repeat next yr  Need for shingles vaccine - Plan: zoster vaccine live, PF, (ZOSTAVAX) [id number] UNT/0.65ML injection, DISCONTINUED: zoster vaccine live, PF, (ZOSTAVAX) [id number] UNT/0.65ML injection  Meds ordered this encounter  Medications  . DISCONTD: zoster vaccine live, PF, (ZOSTAVAX) [id number] UNT/0.65ML injection    Sig: Inject 19,400 Units into the skin once.    Dispense:  1 each    Refill:  0    Order Specific Question:  Supervising Provider    Answer:  Ethelda ChickSMITH, KRISTI M [[id number]]  . zoster vaccine live, PF, (ZOSTAVAX) [id number] UNT/0.65ML injection    Sig: Inject 19,400 Units into the skin once.    Dispense:  1 each    Refill:  0  . omeprazole (PRILOSEC) 40 MG capsule    Sig: Take 1 capsule (40 mg total) by mouth daily.    Dispense:  30 capsule    Refill:  11  . lisinopril-hydrochlorothiazide (PRINZIDE,ZESTORETIC) 10-12.5 MG per tablet    Sig: Take 1 tablet by mouth daily.    Dispense:  90 tablet    Refill:  3  . fluticasone (FLONASE) 50 MCG/ACT nasal spray    Sig: PLACE 2 SPRAYS INTO THE NOSE DAILY.    Dispense:  16 g    Refill:  11   Pt assessed, reviewed documentation and agree w/ assessment and plan. Norberto SorensonEva Marylen Zuk, MD MPH

## 2014-07-12 ENCOUNTER — Encounter: Payer: Self-pay | Admitting: *Deleted

## 2014-07-12 DIAGNOSIS — I872 Venous insufficiency (chronic) (peripheral): Secondary | ICD-10-CM | POA: Insufficient documentation

## 2015-05-07 ENCOUNTER — Other Ambulatory Visit: Payer: Self-pay | Admitting: Family Medicine

## 2015-05-22 ENCOUNTER — Encounter: Payer: Self-pay | Admitting: Family Medicine

## 2015-05-22 ENCOUNTER — Ambulatory Visit (INDEPENDENT_AMBULATORY_CARE_PROVIDER_SITE_OTHER): Payer: Federal, State, Local not specified - PPO | Admitting: Family Medicine

## 2015-05-22 VITALS — BP 132/60 | HR 80 | Temp 98.2°F | Resp 16 | Ht 63.25 in | Wt 190.0 lb

## 2015-05-22 DIAGNOSIS — K219 Gastro-esophageal reflux disease without esophagitis: Secondary | ICD-10-CM | POA: Diagnosis not present

## 2015-05-22 DIAGNOSIS — Z23 Encounter for immunization: Secondary | ICD-10-CM

## 2015-05-22 DIAGNOSIS — J309 Allergic rhinitis, unspecified: Secondary | ICD-10-CM | POA: Diagnosis not present

## 2015-05-22 DIAGNOSIS — I872 Venous insufficiency (chronic) (peripheral): Secondary | ICD-10-CM

## 2015-05-22 DIAGNOSIS — I10 Essential (primary) hypertension: Secondary | ICD-10-CM

## 2015-05-22 DIAGNOSIS — Z0000 Encounter for general adult medical examination without abnormal findings: Secondary | ICD-10-CM | POA: Diagnosis not present

## 2015-05-22 DIAGNOSIS — Z13820 Encounter for screening for osteoporosis: Secondary | ICD-10-CM

## 2015-05-22 LAB — CBC
HCT: 38.7 % (ref 36.0–46.0)
HEMOGLOBIN: 12.7 g/dL (ref 12.0–15.0)
MCH: 29.3 pg (ref 26.0–34.0)
MCHC: 32.8 g/dL (ref 30.0–36.0)
MCV: 89.2 fL (ref 78.0–100.0)
MPV: 10.5 fL (ref 8.6–12.4)
PLATELETS: 322 10*3/uL (ref 150–400)
RBC: 4.34 MIL/uL (ref 3.87–5.11)
RDW: 13.6 % (ref 11.5–15.5)
WBC: 7.5 10*3/uL (ref 4.0–10.5)

## 2015-05-22 LAB — POCT URINALYSIS DIP (MANUAL ENTRY)
Bilirubin, UA: NEGATIVE
Glucose, UA: NEGATIVE
Ketones, POC UA: NEGATIVE
LEUKOCYTES UA: NEGATIVE
NITRITE UA: NEGATIVE
PH UA: 5
Protein Ur, POC: NEGATIVE
RBC UA: NEGATIVE
Spec Grav, UA: 1.02
UROBILINOGEN UA: 0.2

## 2015-05-22 LAB — COMPREHENSIVE METABOLIC PANEL
ALBUMIN: 4.1 g/dL (ref 3.6–5.1)
ALK PHOS: 58 U/L (ref 33–130)
ALT: 18 U/L (ref 6–29)
AST: 17 U/L (ref 10–35)
BILIRUBIN TOTAL: 0.3 mg/dL (ref 0.2–1.2)
BUN: 19 mg/dL (ref 7–25)
CALCIUM: 9 mg/dL (ref 8.6–10.4)
CO2: 28 mmol/L (ref 20–31)
Chloride: 103 mmol/L (ref 98–110)
Creat: 0.76 mg/dL (ref 0.50–0.99)
GLUCOSE: 84 mg/dL (ref 65–99)
POTASSIUM: 3.9 mmol/L (ref 3.5–5.3)
Sodium: 141 mmol/L (ref 135–146)
Total Protein: 7.3 g/dL (ref 6.1–8.1)

## 2015-05-22 LAB — TSH: TSH: 2.937 u[IU]/mL (ref 0.350–4.500)

## 2015-05-22 MED ORDER — OMEPRAZOLE 40 MG PO CPDR: 40.0000 mg | DELAYED_RELEASE_CAPSULE | Freq: Every day | ORAL | Status: DC

## 2015-05-22 MED ORDER — LISINOPRIL-HYDROCHLOROTHIAZIDE 10-12.5 MG PO TABS: ORAL_TABLET | ORAL | Status: DC

## 2015-05-22 MED ORDER — FLUTICASONE PROPIONATE 50 MCG/ACT NA SUSP: NASAL | Status: DC

## 2015-05-22 MED ORDER — ZOSTER VACCINE LIVE 19400 UNT/0.65ML ~~LOC~~ SOLR: 0.6500 mL | Freq: Once | SUBCUTANEOUS | Status: DC

## 2015-05-22 NOTE — Patient Instructions (Signed)
You need to schedule you mammogram and your bone scan.  Please call the breast center at [facility] at [phone] to schedule  Bone Health Bones protect organs, store calcium, and anchor muscles. Good health habits, such as eating nutritious foods and exercising regularly, are important for maintaining healthy bones. They can also help to prevent a condition that causes bones to lose density and become weak and brittle (osteoporosis). WHY IS BONE MASS IMPORTANT? Bone mass refers to the amount of bone tissue that you have. The higher your bone mass, the stronger your bones. An important step toward having healthy bones throughout life is to have strong and dense bones during childhood. A young adult who has a high bone mass is more likely to have a high bone mass later in life. Bone mass at its greatest it is called peak bone mass. A large decline in bone mass occurs in older adults. In women, it occurs about the time of menopause. During this time, it is important to practice good health habits, because if more bone is lost than what is replaced, the bones will become less healthy and more likely to break (fracture). If you find that you have a low bone mass, you may be able to prevent osteoporosis or further bone loss by changing your diet and lifestyle. HOW CAN I FIND OUT IF MY BONE MASS IS LOW? Bone mass can be measured with an X-ray test that is called a bone mineral density (BMD) test. This test is recommended for all women who are age 64 or older. It may also be recommended for men who are age 80770 or older, or for people who are more likely to develop osteoporosis due to:  Having bones that break easily.  Having a long-term disease that weakens bones, such as kidney disease or rheumatoid arthritis.  Having menopause earlier than normal.  Taking medicine that weakens bones, such as steroids, thyroid hormones, or hormone treatment for breast cancer or prostate  cancer.  Smoking.  Drinking three or more alcoholic drinks each day. WHAT ARE THE NUTRITIONAL RECOMMENDATIONS FOR HEALTHY BONES? To have healthy bones, you need to get enough of the right minerals and vitamins. Most nutrition experts recommend getting these nutrients from the foods that you eat. Nutritional recommendations vary from person to person. Ask your health care provider what is healthy for you. Here are some general guidelines. Calcium Recommendations Calcium is the most important (essential) mineral for bone health. Most people can get enough calcium from their diet, but supplements may be recommended for people who are at risk for osteoporosis. Good sources of calcium include:  Dairy products, such as low-fat or nonfat milk, cheese, and yogurt.  Dark green leafy vegetables, such as bok choy and broccoli.  Calcium-fortified foods, such as orange juice, cereal, bread, soy beverages, and tofu products.  Nuts, such as almonds. Follow these recommended amounts for daily calcium intake:  Children, age 80-3: 700 mg.  Children, age 51-8: 1,000 mg.  Children, age 549-13: 1,300 mg.  Teens, age 51814-18: 1,300 mg.  Adults, age 64-50: 1,000 mg.  Adults, age 64-70:  Men: 1,000 mg.  Women: 1,200 mg.  Adults, age 64 or older: 1,200 mg.  Pregnant and breastfeeding females:  Teens: 1,300 mg.  Adults: 1,000 mg. Vitamin D Recommendations Vitamin D is the most essential vitamin for bone health. It helps the body to absorb calcium. Sunlight stimulates the skin to make vitamin D, so be sure to get enough sunlight. If you live  in a cold climate or you do not get outside often, your health care provider may recommend that you take vitamin D supplements. Good sources of vitamin D in your diet include:  Egg yolks.  Saltwater fish.  Milk and cereal fortified with vitamin D. Follow these recommended amounts for daily vitamin D intake:  Children and teens, age 63-18: 600 international  units.  Adults, age 70 or younger: 400-800 international units.  Adults, age 639 or older: 800-1,000 international units. Other Nutrients Other nutrients for bone health include:  Phosphorus. This mineral is found in meat, poultry, dairy foods, nuts, and legumes. The recommended daily intake for adult men and adult women is 700 mg.  Magnesium. This mineral is found in seeds, nuts, dark green vegetables, and legumes. The recommended daily intake for adult men is 400-420 mg. For adult women, it is 310-320 mg.  Vitamin K. This vitamin is found in green leafy vegetables. The recommended daily intake is 120 mg for adult men and 90 mg for adult women. WHAT TYPE OF PHYSICAL ACTIVITY IS BEST FOR BUILDING AND MAINTAINING HEALTHY BONES? Weight-bearing and strength-building activities are important for building and maintaining peak bone mass. Weight-bearing activities cause muscles and bones to work against gravity. Strength-building activities increases muscle strength that supports bones. Weight-bearing and muscle-building activities include:  Walking and hiking.  Jogging and running.  Dancing.  Gym exercises.  Lifting weights.  Tennis and racquetball.  Climbing stairs.  Aerobics. Adults should get at least 30 minutes of moderate physical activity on most days. Children should get at least 60 minutes of moderate physical activity on most days. Ask your health care provide what type of exercise is best for you. WHERE CAN I FIND MORE INFORMATION? For more information, check out the following websites:  National Osteoporosis Foundation: http://burton-owens.org/  [employer]: http://www.niams.http://www.johnson-fowler.biz/.asp   This information is not intended to replace advice given to you by your health care provider. Make sure you discuss any questions you have with your health care provider.   Document Released: 10/02/2003 Document  Revised: 11/26/2014 Document Reviewed: 07/17/2014 Elsevier Interactive Patient Education [employer]

## 2015-05-22 NOTE — Progress Notes (Signed)
Subjective:    Patient ID: Frances Hunter, female    DOB: May 31, 1951, 64 y.o.   MRN: [id number]   Chief Complaint  Patient presents with  . Annual Exam    HPI   Taking 1 a day older women vitamins daily. Had virtual CT colonoscoyp in 2009, mam is sched for next mo, vision is sched, dentist every 6 mos Tdap 2008 Started lifting weights, starting alive ca/vit D Past Medical History  Diagnosis Date  . Allergy   . Hypertension    Current Outpatient Prescriptions on File Prior to Visit  Medication Sig Dispense Refill  . fluticasone (FLONASE) 50 MCG/ACT nasal spray PLACE 2 SPRAYS INTO THE NOSE DAILY. 16 g 11  . lisinopril-hydrochlorothiazide (PRINZIDE,ZESTORETIC) 10-12.5 MG tablet TAKE 1 TABLET BY MOUTH DAILY 90 tablet 0  . omeprazole (PRILOSEC) 40 MG capsule Take 1 capsule (40 mg total) by mouth daily. 30 capsule 11  . zoster vaccine live, PF, (ZOSTAVAX) 40981 UNT/0.65ML injection Inject 19,400 Units into the skin once. 1 each 0   No current facility-administered medications on file prior to visit.   No Known Allergies No past surgical history on file. Social History   Social History  . Marital Status: Married    Spouse Name: N/A  . Number of Children: N/A  . Years of Education: N/A   Social History Main Topics  . Smoking status: Never Smoker   . Smokeless tobacco: Not on file  . Alcohol Use: 1.2 oz/week    2 Glasses of wine per week  . Drug Use: No  . Sexual Activity: Yes   Other Topics Concern  . Not on file   Social History Narrative  . No narrative on file   Family History  Problem Relation Age of Onset  . Cancer Father 63    lymphoma  . Diabetes Brother   . Diabetes Maternal Grandfather   . Cancer Paternal Grandfather   . Liver disease Mother 64    cyst     Review of Systems  HENT: Positive for tinnitus.   Musculoskeletal: Positive for neck stiffness.  Hematological: Bruises/bleeds easily.  All other systems reviewed and are negative.    There were no vitals taken for this visit. Objective:   Physical Exam  Constitutional: She is oriented to person, place, and time. She appears well-developed and well-nourished. No distress.  HENT:  Head: Normocephalic and atraumatic.  Right Ear: Tympanic membrane, external ear and ear canal normal.  Left Ear: Tympanic membrane, external ear and ear canal normal.  Nose: No rhinorrhea.  Mouth/Throat: Uvula is midline, oropharynx is clear and moist and mucous membranes are normal. No posterior oropharyngeal erythema.  Eyes: Conjunctivae and EOM are normal. Pupils are equal, round, and reactive to light. Right eye exhibits no discharge. Left eye exhibits no discharge. No scleral icterus.  Neck: Normal range of motion. Neck supple. No thyromegaly present.  Cardiovascular: Normal rate, regular rhythm, normal heart sounds and intact distal pulses.   Pulmonary/Chest: Effort normal and breath sounds normal. No respiratory distress.  Abdominal: Soft. Bowel sounds are normal. There is no tenderness.  Genitourinary: Vagina normal and uterus normal. No breast swelling, tenderness, discharge or bleeding. Cervix exhibits no motion tenderness and no friability. Right adnexum displays no mass and no tenderness. Left adnexum displays no mass and no tenderness.  Musculoskeletal: She exhibits no edema.  Lymphadenopathy:    She has no cervical adenopathy.  Neurological: She is alert and oriented to person, place, and time. She has normal  reflexes.  Skin: Skin is warm and dry. She is not diaphoretic. No erythema.  Psychiatric: She has a normal mood and affect. Her behavior is normal.       Results for orders placed or performed in visit on 05/17/14  COMPLETE METABOLIC PANEL WITH GFR  Result Value Ref Range   Sodium 139 135 - 145 mEq/L   Potassium 4.6 3.5 - 5.3 mEq/L   Chloride 99 96 - 112 mEq/L   CO2 29 19 - 32 mEq/L   Glucose, Bld 74 70 - 99 mg/dL   BUN 20 6 - 23 mg/dL   Creat 1.61 0.96 - 0.45 mg/dL    Total Bilirubin 0.5 0.2 - 1.2 mg/dL   Alkaline Phosphatase 64 39 - 117 U/L   AST 21 0 - 37 U/L   ALT 21 0 - 35 U/L   Total Protein 7.5 6.0 - 8.3 g/dL   Albumin 4.3 3.5 - 5.2 g/dL   Calcium 9.8 8.4 - 40.9 mg/dL   GFR, Est African American 88 mL/min   GFR, Est Non African American 76 mL/min  TSH  Result Value Ref Range   TSH 2.605 0.350 - 4.500 uIU/mL  Lipid panel  Result Value Ref Range   Cholesterol 138 0 - 200 mg/dL   Triglycerides 56 <811 mg/dL   HDL 59 >91 mg/dL   Total CHOL/HDL Ratio 2.3 Ratio   VLDL 11 0 - 40 mg/dL   LDL Cholesterol 68 0 - 99 mg/dL  Vit D  25 hydroxy (rtn osteoporosis monitoring)  Result Value Ref Range   Vit D, 25-Hydroxy 44 30 - 89 ng/mL  POCT urinalysis dipstick  Result Value Ref Range   Color, UA yellow    Clarity, UA clear    Glucose, UA neg    Bilirubin, UA neg    Ketones, UA neg    Spec Grav, UA <=1.005    Blood, UA trace-lysed    pH, UA 6.0    Protein, UA neg    Urobilinogen, UA 0.2    Nitrite, UA neg    Leukocytes, UA Trace     Assessment & Plan:   Last pap next yr at 65 yo, EKG CT colnoscpy - cont routine screening  US thyroid due to right  1. Annual physical exam   2. Essential hypertension, benign   3. Chronic venous insufficiency   4. Gastroesophageal reflux disease, esophagitis presence not specified   5. Allergic rhinitis, unspecified allergic rhinitis type   6. Flu vaccine need   7. Need for shingles vaccine   8. Screening for osteoporosis   9. Need for Tdap vaccination     Orders Placed This Encounter  Procedures  . DG Bone Density    Standing Status: Future     Number of Occurrences:      Standing Expiration Date: 07/21/2016    Order Specific Question:  Reason for Exam (SYMPTOM  OR DIAGNOSIS REQUIRED)    Answer:  screening for osteoporosis    Order Specific Question:  Preferred imaging location?    Answer:  [facility]  . Flu Vaccine QUAD 36+ mos IM  . Tdap vaccine greater than or equal to 7yo IM  .  Comprehensive metabolic panel  . CBC  . TSH  . POCT urinalysis dipstick    Meds ordered this encounter  Medications  . lisinopril-hydrochlorothiazide (PRINZIDE,ZESTORETIC) 10-12.5 MG tablet    Sig: TAKE 1 TABLET BY MOUTH DAILY    Dispense:  90 tablet  Refill:  3  . zoster vaccine live, PF, (ZOSTAVAX) [id number] UNT/0.65ML injection    Sig: Inject 19,400 Units into the skin once.    Dispense:  1 each    Refill:  0  . omeprazole (PRILOSEC) 40 MG capsule    Sig: Take 1 capsule (40 mg total) by mouth daily.    Dispense:  30 capsule    Refill:  11  . fluticasone (FLONASE) 50 MCG/ACT nasal spray    Sig: PLACE 2 SPRAYS INTO THE NOSE DAILY.    Dispense:  16 g    Refill:  11     Norberto SorensonEva Chayson Charters, MD MPH

## 2015-05-23 ENCOUNTER — Encounter: Payer: Federal, State, Local not specified - PPO | Admitting: Family Medicine

## 2015-05-28 ENCOUNTER — Other Ambulatory Visit: Payer: Self-pay

## 2015-05-28 DIAGNOSIS — E2839 Other primary ovarian failure: Secondary | ICD-10-CM

## 2015-06-02 ENCOUNTER — Encounter: Payer: Self-pay | Admitting: Family Medicine

## 2015-06-03 ENCOUNTER — Other Ambulatory Visit: Payer: Self-pay

## 2015-06-03 DIAGNOSIS — Z1231 Encounter for screening mammogram for malignant neoplasm of breast: Secondary | ICD-10-CM

## 2015-07-07 ENCOUNTER — Ambulatory Visit
Admission: RE | Admit: 2015-07-07 | Discharge: 2015-07-07 | Disposition: A | Discharge status: Home / Self Care | Payer: Federal, State, Local not specified - PPO | Source: Ambulatory Visit | Attending: Family Medicine | Admitting: Family Medicine

## 2015-07-07 ENCOUNTER — Ambulatory Visit
Admission: RE | Admit: 2015-07-07 | Discharge: 2015-07-07 | Disposition: A | Discharge status: Home / Self Care | Payer: Federal, State, Local not specified - PPO | Source: Ambulatory Visit

## 2015-07-07 DIAGNOSIS — M85859 Other specified disorders of bone density and structure, unspecified thigh: Secondary | ICD-10-CM | POA: Insufficient documentation

## 2015-07-07 DIAGNOSIS — Z1231 Encounter for screening mammogram for malignant neoplasm of breast: Secondary | ICD-10-CM

## 2015-07-07 DIAGNOSIS — E2839 Other primary ovarian failure: Secondary | ICD-10-CM

## 2015-07-08 ENCOUNTER — Encounter: Payer: Self-pay | Admitting: Family Medicine

## 2015-12-31 ENCOUNTER — Ambulatory Visit (INDEPENDENT_AMBULATORY_CARE_PROVIDER_SITE_OTHER): Payer: Medicare Other | Admitting: Physician Assistant

## 2015-12-31 ENCOUNTER — Ambulatory Visit (INDEPENDENT_AMBULATORY_CARE_PROVIDER_SITE_OTHER): Payer: Medicare Other

## 2015-12-31 VITALS — BP 138/64 | HR 106 | Temp 100.1°F | Resp 18 | Ht 63.0 in | Wt 179.0 lb

## 2015-12-31 DIAGNOSIS — J111 Influenza due to unidentified influenza virus with other respiratory manifestations: Secondary | ICD-10-CM

## 2015-12-31 DIAGNOSIS — R05 Cough: Secondary | ICD-10-CM

## 2015-12-31 DIAGNOSIS — R509 Fever, unspecified: Secondary | ICD-10-CM | POA: Diagnosis not present

## 2015-12-31 DIAGNOSIS — R69 Illness, unspecified: Principal | ICD-10-CM

## 2015-12-31 LAB — POCT URINALYSIS DIP (MANUAL ENTRY)
BILIRUBIN UA: NEGATIVE
BILIRUBIN UA: NEGATIVE
GLUCOSE UA: NEGATIVE
Leukocytes, UA: NEGATIVE
Nitrite, UA: NEGATIVE
PH UA: 5
Protein Ur, POC: NEGATIVE
SPEC GRAV UA: 1.015
Urobilinogen, UA: 0.2

## 2015-12-31 LAB — POCT INFLUENZA A/B
Influenza A, POC: NEGATIVE
Influenza B, POC: NEGATIVE

## 2015-12-31 LAB — POCT CBC
GRANULOCYTE PERCENT: 77.5 % (ref 37–80)
HCT, POC: 37.8 % (ref 37.7–47.9)
HEMOGLOBIN: 13.1 g/dL (ref 12.2–16.2)
Lymph, poc: 1.4 (ref 0.6–3.4)
MCH: 29.9 pg (ref 27–31.2)
MCHC: 34.5 g/dL (ref 31.8–35.4)
MCV: 86.7 fL (ref 80–97)
MID (cbc): 0.3 (ref 0–0.9)
MPV: 7.9 fL (ref 0–99.8)
POC Granulocyte: 5.7 (ref 2–6.9)
POC LYMPH PERCENT: 19 %L (ref 10–50)
POC MID %: 3.5 % (ref 0–12)
Platelet Count, POC: 247 10*3/uL (ref 142–424)
RBC: 4.36 M/uL (ref 4.04–5.48)
RDW, POC: 13.6 %
WBC: 7.3 10*3/uL (ref 4.6–10.2)

## 2015-12-31 LAB — POC MICROSCOPIC URINALYSIS (UMFC): Mucus: ABSENT

## 2015-12-31 LAB — POCT RAPID STREP A (OFFICE): Rapid Strep A Screen: NEGATIVE

## 2015-12-31 MED ORDER — ACETAMINOPHEN 325 MG PO TABS
1000.0000 mg | ORAL_TABLET | Freq: Once | ORAL | Status: AC
  Administered 2015-12-31: 975 mg via ORAL

## 2015-12-31 NOTE — Patient Instructions (Signed)
Alternate Tylenol and Ibuprofen at their respective over the counter doses.  Return tomorrow if you symptoms change or worsen.  Come back in 48 hours if you are not feeling better.

## 2015-12-31 NOTE — Progress Notes (Signed)
12/31/2015 6:49 PM   DOB: 1951-04-23 / MRN: [id number]  SUBJECTIVE:  Frances Hunter is a 65 y.o. female presenting for cough that started 4 days ago.  She associates myalgia, fatigue, sore throat and nasal congestion.  She feels like she has the flu.  She had the seasonal flu shot.  She feels that she is getting worse.  She denies chest pain, SOB, DOE, dysuria, urgency, frequency, nausea, belly pain and leg swelling. She reports a sick contact in her grandson who was coughing in her face on Saturday.   She has No Known Allergies.   She  has a past medical history of Allergy and Hypertension.    She  reports that she has never smoked. She does not have any smokeless tobacco history on file. She reports that she drinks about 1.2 oz of alcohol per week. She reports that she does not use illicit drugs. She  reports that she currently engages in sexual activity. The patient  has past surgical history that includes Tubal ligation.  Her family history includes Cancer in her paternal grandfather; Cancer (age of onset: 18) in her father; Diabetes in her brother and maternal grandfather; Hyperlipidemia in her brother and sister; Hypertension in her brother and father; Liver disease (age of onset: 87) in her mother.  ROS  Per HPI.   Problem list and medications reviewed and updated by myself where necessary, and exist elsewhere in the encounter.   OBJECTIVE:  BP 138/64 mmHg  Pulse 106  Temp(Src) 100.1 F (37.8 C)  Resp 18  Ht  (1.6 m)  Wt 179 lb (81.194 kg)  BMI 31.72 kg/m2  SpO2 98%  Physical Exam  Constitutional: She is oriented to person, place, and time. She appears well-nourished. No distress.  Eyes: EOM are normal. Pupils are equal, round, and reactive to light.  Cardiovascular: Normal rate.   Pulmonary/Chest: Effort normal.  Abdominal: She exhibits no distension.  Neurological: She is alert and oriented to person, place, and time. No cranial nerve deficit. Gait normal.    Skin: Skin is dry. She is not diaphoretic.  Psychiatric: She has a normal mood and affect.  Vitals reviewed.   Results for orders placed or performed in visit on 12/31/15 (from the past 72 hour(s))  POCT rapid strep A     Status: None   Collection Time: 12/31/15  6:14 PM  Result Value Ref Range   Rapid Strep A Screen Negative Negative  POCT Influenza A/B     Status: None   Collection Time: 12/31/15  6:14 PM  Result Value Ref Range   Influenza A, POC Negative Negative   Influenza B, POC Negative Negative  POCT CBC     Status: None   Collection Time: 12/31/15  6:28 PM  Result Value Ref Range   WBC 7.3 4.6 - 10.2 K/uL   Lymph, poc 1.4 0.6 - 3.4   POC LYMPH PERCENT 19.0 10 - 50 %L   MID (cbc) 0.3 0 - 0.9   POC MID % 3.5 0 - 12 %M   POC Granulocyte 5.7 2 - 6.9   Granulocyte percent 77.5 37 - 80 %G   RBC 4.36 4.04 - 5.48 M/uL   Hemoglobin 13.1 12.2 - 16.2 g/dL   HCT, POC 40.9 81.1 - 47.9 %   MCV 86.7 80 - 97 fL   MCH, POC 29.9 27 - 31.2 pg   MCHC 34.5 31.8 - 35.4 g/dL   RDW, POC 91.4 %  Platelet Count, POC 247 142 - 424 K/uL   MPV 7.9 0 - 99.8 fL  POCT urinalysis dipstick     Status: Abnormal   Collection Time: 12/31/15  6:28 PM  Result Value Ref Range   Color, UA yellow yellow   Clarity, UA clear clear   Glucose, UA negative negative   Bilirubin, UA negative negative   Ketones, POC UA negative negative   Spec Grav, UA 1.015    Blood, UA moderate (A) negative   pH, UA 5.0    Protein Ur, POC negative negative   Urobilinogen, UA 0.2    Nitrite, UA Negative Negative   Leukocytes, UA Negative Negative  POCT Microscopic Urinalysis ([facility])     Status: Abnormal   Collection Time: 12/31/15  6:42 PM  Result Value Ref Range   WBC,UR,HPF,POC None None WBC/hpf   RBC,UR,HPF,POC Few (A) None RBC/hpf   Bacteria Few (A) None, Too numerous to count   Mucus Absent Absent   Epithelial Cells, UR Per Microscopy Few (A) None, Too numerous to count cells/hpf    No results  found.  ASSESSMENT AND PLAN  Barrett Shellervolia was seen today for cough and fever.  Diagnoses and all orders for this visit:  Influenza-like illness: Flu and strep negative.  CBC is negative.  She has an isolated hematuria however she does have epis on micro. She most likely is suffering from a virus.  Tamiflu would likely not help as her symptoms have been present for greater than 48 hours.  Will advise she take Ibuprofen and Tylenol at their respective OTC doses and drink copious fluids.  Follow up is symptoms change or worsen.  Follow up in 48 hours if unchanged.  -     POCT rapid strep A -     Culture, Group A Strep -     POCT Influenza A/B -     POCT CBC -     POCT urinalysis dipstick -     DG Chest 2 View; Future    The patient was advised to call or return to clinic if she does not see an improvement in symptoms or to seek the care of the closest emergency department if she worsens with the above plan.   Deliah BostonMichael Christena Sunderlin, MHS, PA-C [facility] 12/31/2015 6:49 PM

## 2016-01-02 ENCOUNTER — Telehealth: Payer: Self-pay | Admitting: Emergency Medicine

## 2016-01-02 LAB — CULTURE, GROUP A STREP: Organism ID, Bacteria: NORMAL

## 2016-01-02 NOTE — Telephone Encounter (Signed)
-----   Message from Ofilia NeasMichael L Clark, PA-C sent at 01/02/2016  1:55 PM EDT ----- Please make patient aware of results via letter or phone call.  Deliah BostonMichael Clark PA-C, 01/02/2016 1:55 PM

## 2016-01-02 NOTE — Telephone Encounter (Signed)
Left message with normal lab results.

## 2016-05-26 NOTE — Progress Notes (Deleted)
Subjective:    Patient ID: Frances Hunter, female    DOB: 01/23/1951, 65 y.o.   MRN: [id number] Chief Complaint  Patient presents with  . Annual Exam  . Medication Refill    PRILOSEC    HPI  Frances Hunter is a delightfu 65 yo woman here today for her annual physical exam.    I last saw her 1 yr prior for the same.  Chronic medical conditions: HTN:  On lisinopril-hctz 10-12.5 - checks GERD: on omeprazole 40   walking Preventative Care: Breast Cancer: Has mammogram 06/2015 nml - set up for next month Cervical/pelvic cancer: pap smear 02/2013 was normal, no HPV testing CRS: has virtual CT colonoscopy 2009 Bone health: dexa 06/2015 shows osteopenia with T score of -1.3 at right femur - has multivitamin  Immunizatoins: TDaP 2016. Gave zostavax rx at last visit, flu shot annually, pneumovax-23 in 2011 otc supp/vit: on mvi, ca/vit D Optho/dentist - regularly.  [place] Exercise - regularly, walking more, some weights Diet:  [facility] 05/27/2016 12/31/2015 05/17/2014  Decreased Interest 0 0 0  Down, Depressed, Hopeless 0 0 0  PHQ - 2 Score 0 0 0   Past Medical History:  Diagnosis Date  . Allergy   . Hypertension    Past Surgical History:  Procedure Laterality Date  . TUBAL LIGATION     No current outpatient prescriptions on file prior to visit.   No current facility-administered medications on file prior to visit.    No Known Allergies Family History  Problem Relation Age of Onset  . Cancer Father 6553    lymphoma  . Hypertension Father   . Diabetes Brother   . Hyperlipidemia Brother   . Hypertension Brother   . Diabetes Maternal Grandfather   . Cancer Paternal Grandfather   . Liver disease Mother [id number]    cyst  . Hyperlipidemia Sister    Social History   Social History  . Marital status: Married    Spouse name: N/A  . Number of children: N/A  . Years of education: N/A   Occupational History  . Retired    Social  History Main Topics  . Smoking status: Never Smoker  . Smokeless tobacco: None  . Alcohol use 1.2 oz/week    2 Glasses of wine per week  . Drug use: No  . Sexual activity: Yes   Other Topics Concern  . None   Social History Narrative   Married. College: Yes. Exercise: Yes.    Review of Systems See hpi    Objective:   Physical Exam  Constitutional: She is oriented to person, place, and time. She appears well-developed and well-nourished. No distress.  HENT:  Head: Normocephalic and atraumatic.  Right Ear: Tympanic membrane, external ear and ear canal normal.  Left Ear: Tympanic membrane, external ear and ear canal normal.  Nose: Mucosal edema present. No rhinorrhea.  Mouth/Throat: Uvula is midline, oropharynx is clear and moist and mucous membranes are normal. No posterior oropharyngeal erythema.  Eyes: Conjunctivae and EOM are normal. Pupils are equal, round, and reactive to light. Right eye exhibits no discharge. Left eye exhibits no discharge. No scleral icterus.  Neck: Normal range of motion. Neck supple. Thyroid mass and thyromegaly present.  Cardiovascular: Normal rate, regular rhythm, normal heart sounds and intact distal pulses.   Pulmonary/Chest: Effort normal and breath sounds normal. No respiratory distress.  Abdominal: Soft. Bowel sounds are normal. There is no tenderness.  Genitourinary: Vagina  normal and uterus normal. No breast swelling, tenderness, discharge or bleeding. Cervix exhibits no motion tenderness and no friability. Right adnexum displays no mass and no tenderness. Left adnexum displays no mass and no tenderness.  Musculoskeletal: She exhibits no edema.  Lymphadenopathy:    She has no cervical adenopathy.  Neurological: She is alert and oriented to person, place, and time. She has normal reflexes.  Skin: Skin is warm and dry. She is not diaphoretic. No erythema.  Psychiatric: She has a normal mood and affect. Her behavior is normal.   BP 132/62 (BP  Location: Left Arm, Patient Position: Sitting, Cuff Size: Large)   Pulse 82   Temp 98.2 F (36.8 C) (Oral)   Resp 16   Ht 5\' 3"  (1.6 m)   Wt 182 lb (82.6 kg)   SpO2 98%   BMI 32.24 kg/m     THYROID??    Assessment & Plan:  Pap w/ HPV- last done today Needs  flu shot and prevnar-13 today.   Needs zostavax rx again - didn't ever get shingles vaccines Hep C, HIV, ua, cbc, cmp, lipid, tsh EKG - needs WTM   Couldn't get hiv, tsh, cbc covered  1. Annual physical exam   2. Routine screening for STI (sexually transmitted infection)   3. Encounter for screening mammogram for breast cancer   4. Screening for cardiovascular, respiratory, and genitourinary diseases   5. Screening for cervical cancer   6. Screening for deficiency anemia   7. Screening for thyroid disorder   8. Essential hypertension, benign   9. Gastroesophageal reflux disease, esophagitis presence not specified   10. Osteopenia of thigh, unspecified laterality   11. Need for prophylactic vaccination and inoculation against influenza   12. Thyroid mass     Orders Placed This Encounter  Procedures  . US THYROID    EPIC ORDER/wt 182/no needs/mcr and bcbs/plm and pt with epic order       Standing Status:   Future    Standing Expiration Date:   07/27/2017    Order Specific Question:   Reason for Exam (SYMPTOM  OR DIAGNOSIS REQUIRED)    Answer:   enlarged nodule on right lobe    Order Specific Question:   Preferred imaging location?    Answer:   [facility]  . Flu Vaccine QUAD 36+ mos IM  . Pneumococcal conjugate vaccine 13-valent IM  . Comprehensive metabolic panel    Order Specific Question:   Has the patient fasted?    Answer:   Yes  . Hepatitis C Antibody  . Thyroid Panel With TSH  . POCT urinalysis dipstick  . EKG 12-Lead    Meds ordered this encounter  Medications  . Zoster Vaccine Live, PF, (ZOSTAVAX) [id number] UNT/0.65ML injection    Sig: Inject 19,400 Units into the skin once.     Dispense:  1 vial    Refill:  0  . omeprazole (PRILOSEC) 40 MG capsule    Sig: Take 1 capsule (40 mg total) by mouth daily.    Dispense:  30 capsule    Refill:  11  . lisinopril-hydrochlorothiazide (PRINZIDE,ZESTORETIC) 10-12.5 MG tablet    Sig: TAKE 1 TABLET BY MOUTH DAILY    Dispense:  90 tablet    Refill:  3  . fluticasone (FLONASE) 50 MCG/ACT nasal spray    Sig: PLACE 2 SPRAYS INTO THE NOSE DAILY.    Dispense:  16 g    Refill:  11    Norberto SorensonEva Shaw, M.D.  [facility] [address] [place], [place] [address] [phone] phone [phone] fax  06/27/16 11:00 AM

## 2016-05-27 ENCOUNTER — Ambulatory Visit (INDEPENDENT_AMBULATORY_CARE_PROVIDER_SITE_OTHER): Payer: Medicare Other | Admitting: Family Medicine

## 2016-05-27 ENCOUNTER — Encounter: Payer: Self-pay | Admitting: Family Medicine

## 2016-05-27 VITALS — BP 132/62 | HR 82 | Temp 98.2°F | Resp 16 | Ht 63.0 in | Wt 182.0 lb

## 2016-05-27 DIAGNOSIS — Z1231 Encounter for screening mammogram for malignant neoplasm of breast: Secondary | ICD-10-CM | POA: Diagnosis not present

## 2016-05-27 DIAGNOSIS — Z136 Encounter for screening for cardiovascular disorders: Secondary | ICD-10-CM | POA: Diagnosis not present

## 2016-05-27 DIAGNOSIS — Z1383 Encounter for screening for respiratory disorder NEC: Secondary | ICD-10-CM | POA: Diagnosis not present

## 2016-05-27 DIAGNOSIS — K219 Gastro-esophageal reflux disease without esophagitis: Secondary | ICD-10-CM

## 2016-05-27 DIAGNOSIS — Z23 Encounter for immunization: Secondary | ICD-10-CM

## 2016-05-27 DIAGNOSIS — Z130 Encounter for screening for diseases of the blood and blood-forming organs and certain disorders involving the immune mechanism: Secondary | ICD-10-CM

## 2016-05-27 DIAGNOSIS — Z0000 Encounter for general adult medical examination without abnormal findings: Secondary | ICD-10-CM

## 2016-05-27 DIAGNOSIS — Z1329 Encounter for screening for other suspected endocrine disorder: Secondary | ICD-10-CM

## 2016-05-27 DIAGNOSIS — Z124 Encounter for screening for malignant neoplasm of cervix: Secondary | ICD-10-CM | POA: Diagnosis not present

## 2016-05-27 DIAGNOSIS — M85859 Other specified disorders of bone density and structure, unspecified thigh: Secondary | ICD-10-CM

## 2016-05-27 DIAGNOSIS — I10 Essential (primary) hypertension: Secondary | ICD-10-CM

## 2016-05-27 DIAGNOSIS — Z113 Encounter for screening for infections with a predominantly sexual mode of transmission: Secondary | ICD-10-CM

## 2016-05-27 DIAGNOSIS — Z1389 Encounter for screening for other disorder: Secondary | ICD-10-CM

## 2016-05-27 DIAGNOSIS — E079 Disorder of thyroid, unspecified: Secondary | ICD-10-CM | POA: Diagnosis not present

## 2016-05-27 LAB — POCT URINALYSIS DIP (MANUAL ENTRY)
BILIRUBIN UA: NEGATIVE
Bilirubin, UA: NEGATIVE
Glucose, UA: NEGATIVE
Leukocytes, UA: NEGATIVE
Nitrite, UA: NEGATIVE
PH UA: 6
PROTEIN UA: NEGATIVE
UROBILINOGEN UA: 0.2

## 2016-05-27 LAB — COMPREHENSIVE METABOLIC PANEL
ALT: 20 U/L (ref 6–29)
AST: 17 U/L (ref 10–35)
Albumin: 4.1 g/dL (ref 3.6–5.1)
Alkaline Phosphatase: 65 U/L (ref 33–130)
BUN: 23 mg/dL (ref 7–25)
CHLORIDE: 103 mmol/L (ref 98–110)
CO2: 28 mmol/L (ref 20–31)
CREATININE: 0.79 mg/dL (ref 0.50–0.99)
Calcium: 9.6 mg/dL (ref 8.6–10.4)
GLUCOSE: 78 mg/dL (ref 65–99)
POTASSIUM: 3.9 mmol/L (ref 3.5–5.3)
SODIUM: 141 mmol/L (ref 135–146)
Total Bilirubin: 0.3 mg/dL (ref 0.2–1.2)
Total Protein: 7.4 g/dL (ref 6.1–8.1)

## 2016-05-27 MED ORDER — LISINOPRIL-HYDROCHLOROTHIAZIDE 10-12.5 MG PO TABS: ORAL_TABLET | ORAL | 3 refills | Status: DC

## 2016-05-27 MED ORDER — OMEPRAZOLE 40 MG PO CPDR: 40.0000 mg | DELAYED_RELEASE_CAPSULE | Freq: Every day | ORAL | 11 refills | Status: DC

## 2016-05-27 MED ORDER — ZOSTER VACCINE LIVE 19400 UNT/0.65ML ~~LOC~~ SUSR
0.6500 mL | Freq: Once | SUBCUTANEOUS | 0 refills | Status: AC
Start: 2016-05-27 — End: 2016-05-27

## 2016-05-27 MED ORDER — FLUTICASONE PROPIONATE 50 MCG/ACT NA SUSP: NASAL | 11 refills | Status: DC

## 2016-05-27 NOTE — Patient Instructions (Addendum)
   IF you received an x-ray today, you will receive an invoice from [facility]. Please contact [facility] at [phone] with questions or concerns regarding your invoice.   IF you received labwork today, you will receive an invoice from [facility]. Please contact [facility] at [phone] with questions or concerns regarding your invoice.   Our billing staff will not be able to assist you with questions regarding bills from these companies.  You will be contacted with the lab results as soon as they are available. The fastest way to get your results is to activate your My Chart account. Instructions are located on the last page of this paperwork. If you have not heard from us regarding the results in 2 weeks, please contact this office.      Bone Health Bones protect organs, store calcium, and anchor muscles. Good health habits, such as eating nutritious foods and exercising regularly, are important for maintaining healthy bones. They can also help to prevent a condition that causes bones to lose density and become weak and brittle (osteoporosis). WHY IS BONE MASS IMPORTANT? Bone mass refers to the amount of bone tissue that you have. The higher your bone mass, the stronger your bones. An important step toward having healthy bones throughout life is to have strong and dense bones during childhood. A young adult who has a high bone mass is more likely to have a high bone mass later in life. Bone mass at its greatest it is called peak bone mass. A large decline in bone mass occurs in older adults. In women, it occurs about the time of menopause. During this time, it is important to practice good health habits, because if more bone is lost than what is replaced, the bones will become less healthy and more likely to break (fracture). If you find that you have a low bone mass, you may be able to prevent osteoporosis or further bone loss by changing your  diet and lifestyle. HOW CAN I FIND OUT IF MY BONE MASS IS LOW? Bone mass can be measured with an X-ray test that is called a bone mineral density (BMD) test. This test is recommended for all women who are age 65 or older. It may also be recommended for men who are age 70 or older, or for people who are more likely to develop osteoporosis due to:  Having bones that break easily.  Having a long-term disease that weakens bones, such as kidney disease or rheumatoid arthritis.  Having menopause earlier than normal.  Taking medicine that weakens bones, such as steroids, thyroid hormones, or hormone treatment for breast cancer or prostate cancer.  Smoking.  Drinking three or more alcoholic drinks each day. WHAT ARE THE NUTRITIONAL RECOMMENDATIONS FOR HEALTHY BONES? To have healthy bones, you need to get enough of the right minerals and vitamins. Most nutrition experts recommend getting these nutrients from the foods that you eat. Nutritional recommendations vary from person to person. Ask your health care provider what is healthy for you. Here are some general guidelines. Calcium Recommendations Calcium is the most important (essential) mineral for bone health. Most people can get enough calcium from their diet, but supplements may be recommended for people who are at risk for osteoporosis. Good sources of calcium include:  Dairy products, such as low-fat or nonfat milk, cheese, and yogurt.  Dark green leafy vegetables, such as bok choy and broccoli.  Calcium-fortified foods, such as orange juice, cereal, bread, soy beverages, and tofu   products.  Nuts, such as almonds. Follow these recommended amounts for daily calcium intake:  Children, age 1-3: 700 mg.  Children, age 4-8: 1,000 mg.  Children, age 9-13: 1,300 mg.  Teens, age 14-18: 1,300 mg.  Adults, age 19-50: 1,000 mg.  Adults, age 51-70:  Men: 1,000 mg.  Women: 1,200 mg.  Adults, age 71 or older: 1,200 mg.  Pregnant and  breastfeeding females:  Teens: 1,300 mg.  Adults: 1,000 mg. Vitamin D Recommendations Vitamin D is the most essential vitamin for bone health. It helps the body to absorb calcium. Sunlight stimulates the skin to make vitamin D, so be sure to get enough sunlight. If you live in a cold climate or you do not get outside often, your health care provider may recommend that you take vitamin D supplements. Good sources of vitamin D in your diet include:  Egg yolks.  Saltwater fish.  Milk and cereal fortified with vitamin D. Follow these recommended amounts for daily vitamin D intake:  Children and teens, age 1-18: 600 international units.  Adults, age 50 or younger: 400-800 international units.  Adults, age 51 or older: 800-1,000 international units. Other Nutrients Other nutrients for bone health include:  Phosphorus. This mineral is found in meat, poultry, dairy foods, nuts, and legumes. The recommended daily intake for adult men and adult women is 700 mg.  Magnesium. This mineral is found in seeds, nuts, dark green vegetables, and legumes. The recommended daily intake for adult men is 400-420 mg. For adult women, it is 310-320 mg.  Vitamin K. This vitamin is found in green leafy vegetables. The recommended daily intake is 120 mg for adult men and 90 mg for adult women. WHAT TYPE OF PHYSICAL ACTIVITY IS BEST FOR BUILDING AND MAINTAINING HEALTHY BONES? Weight-bearing and strength-building activities are important for building and maintaining peak bone mass. Weight-bearing activities cause muscles and bones to work against gravity. Strength-building activities increases muscle strength that supports bones. Weight-bearing and muscle-building activities include:  Walking and hiking.  Jogging and running.  Dancing.  Gym exercises.  Lifting weights.  Tennis and racquetball.  Climbing stairs.  Aerobics. Adults should get at least 30 minutes of moderate physical activity on most  days. Children should get at least 60 minutes of moderate physical activity on most days. Ask your health care provide what type of exercise is best for you. WHERE CAN I FIND MORE INFORMATION? For more information, check out the following websites:  National Osteoporosis Foundation: http://nof.org/learn/basics  National Institutes of Health: http://www.niams.nih.gov/Health_Info/Bone/Bone_Health/bone_health_for_life.asp   This information is not intended to replace advice given to you by your health care provider. Make sure you discuss any questions you have with your health care provider.   Document Released: 10/02/2003 Document Revised: 11/26/2014 Document Reviewed: 07/17/2014 Elsevier Interactive Patient Education 2016 Elsevier Inc.  

## 2016-05-27 NOTE — Progress Notes (Signed)
Subjective:    Frances Hunter is a 65 y.o. female who presents for a Welcome to Medicare exam.  Frances Hunter is a delightfu 65 yo woman here today for her annual physical exam.    I last saw her 1 yr prior for the same.  Chronic medical conditions: HTN:  On lisinopril-hctz 10-12.5 - checks GERD: on omeprazole 40   Review of Systems See hpi. Complete 13 point ROS done and negative other than as noted above.  [facility] 05/27/2016 12/31/2015 05/17/2014  Decreased Interest 0 0 0  Down, Depressed, Hopeless 0 0 0  PHQ - 2 Score 0 0 0           Objective:    Today's Vitals   05/27/16 1333  BP: 132/62  Pulse: 82  Resp: 16  Temp: 98.2 F (36.8 C)  TempSrc: Oral  SpO2: 98%  Weight: 182 lb (82.6 kg)  Height: 5\' 3"  (1.6 m)  Body mass index is 32.24 kg/m.  Medications Outpatient Encounter Prescriptions as of 05/27/2016  Medication Sig  . fluticasone (FLONASE) 50 MCG/ACT nasal spray PLACE 2 SPRAYS INTO THE NOSE DAILY.  Marland Kitchen. lisinopril-hydrochlorothiazide (PRINZIDE,ZESTORETIC) 10-12.5 MG tablet TAKE 1 TABLET BY MOUTH DAILY  . omeprazole (PRILOSEC) 40 MG capsule Take 1 capsule (40 mg total) by mouth daily.   No facility-administered encounter medications on file as of 05/27/2016.      History: Past Medical History:  Diagnosis Date  . Allergy   . Hypertension    Past Surgical History:  Procedure Laterality Date  . TUBAL LIGATION      Family History  Problem Relation Age of Onset  . Cancer Father [id number]    lymphoma  . Hypertension Father   . Diabetes Brother   . Hyperlipidemia Brother   . Hypertension Brother   . Diabetes Maternal Grandfather   . Cancer Paternal Grandfather   . Liver disease Mother 7569    cyst  . Hyperlipidemia Sister    Social History   Occupational History  . Retired    Social History Main Topics  . Smoking status: Never Smoker  . Smokeless tobacco: Not on file  . Alcohol use 1.2 oz/week    2 Glasses of wine per week  . Drug  use: No  . Sexual activity: Yes    Tobacco Counseling Counseling given: Not Answered   Immunizations and Health Maintenance Immunization History  Administered Date(s) Administered  . Influenza,inj,Quad PF,36+ Mos 05/17/2014, 05/22/2015  . Pneumococcal Polysaccharide-23 02/23/2010  . Tdap 02/24/2007, 05/22/2015   Health Maintenance Due  Topic Date Due  . Hepatitis C Screening  [birth year]  . HIV Screening  [birth year]1965  . ZOSTAVAX  10/30/2010  . PNA vac Low Risk Adult (1 of 2 - PCV13) 10/30/2015  . INFLUENZA VACCINE  02/24/2016  . PAP SMEAR  02/24/2016   Preventative Care: Breast Cancer: Has mammogram 06/2015 nml - set up for next month Cervical/pelvic cancer: pap smear 02/2013 was normal, no HPV testing CRS: has virtual CT colonoscopy 2009 Bone health: dexa 06/2015 shows osteopenia with T score of -1.3 at right femur - has multivitamin  Immunizatoins: TDaP 2016. Gave zostavax rx at last visit, flu shot annually, pneumovax-23 in 2011 otc supp/vit: on mvi, ca/vit D Optho/dentist - regularly.  [place]   Activities of Daily Living No flowsheet data found.  Physical Exam  (optional), or other factors deemed appropriate based on the beneficiary's medical and social history and current clinical  standards. Constitutional: She is oriented to person, place, and time. She appears well-developed and well-nourished. No distress.  HENT:  Head: Normocephalic and atraumatic.  Right Ear: Tympanic membrane, external ear and ear canal normal.  Left Ear: Tympanic membrane, external ear and ear canal normal.  Nose: Mucosal edema present. No rhinorrhea.  Mouth/Throat: Uvula is midline, oropharynx is clear and moist and mucous membranes are normal. No posterior oropharyngeal erythema.  Eyes: Conjunctivae and EOM are normal. Pupils are equal, round, and reactive to light. Right eye exhibits no discharge. Left eye exhibits no discharge. No scleral icterus.  Neck: Normal  range of motion. Neck supple. Thyroid mass and thyromegaly present.  Cardiovascular: Normal rate, regular rhythm, normal heart sounds and intact distal pulses.   Pulmonary/Chest: Effort normal and breath sounds normal. No respiratory distress.  Abdominal: Soft. Bowel sounds are normal. There is no tenderness.  Genitourinary: Vagina normal and uterus normal. No breast swelling, tenderness, discharge or bleeding. Cervix exhibits no motion tenderness and no friability. Right adnexum displays no mass and no tenderness. Left adnexum displays no mass and no tenderness.  Musculoskeletal: She exhibits no edema.  Lymphadenopathy:    She has no cervical adenopathy.  Neurological: She is alert and oriented to person, place, and time. She has normal reflexes.  Skin: Skin is warm and dry. She is not diaphoretic. No erythema.  Psychiatric: She has a normal mood and affect. Her behavior is normal.   BP 132/62 (BP Location: Left Arm, Patient Position: Sitting, Cuff Size: Large)   Pulse 82   Temp 98.2 F (36.8 C) (Oral)   Resp 16   Ht 5\' 3"  (1.6 m)   Wt 182 lb (82.6 kg)   SpO2 98%   BMI 32.24 kg/m    Advanced Directives:      Assessment:    This is a routine wellness examination for this patient .  1. Annual physical exam   2. Routine screening for STI (sexually transmitted infection)   3. Encounter for screening mammogram for breast cancer   4. Screening for cardiovascular, respiratory, and genitourinary diseases   5. Screening for cervical cancer   6. Screening for deficiency anemia   7. Screening for thyroid disorder   8. Essential hypertension, benign   9. Gastroesophageal reflux disease, esophagitis presence not specified   10. Osteopenia of thigh, unspecified laterality   11. Need for prophylactic vaccination and inoculation against influenza   12. Thyroid mass      Vision/Hearing screen No exam data present   Dietary issues and exercise activities discussed:    Exercise -  regularly, walking more, some weights  Goals    None     Depression Screen PHQ 2/9 Scores 12/31/2015 05/17/2014  PHQ - 2 Score 0 0     Fall Risk Fall Risk  12/31/2015  Falls in the past year? No    Cognitive Function:        Patient Care Team: Sherren Mocha, MD as PCP - General (Family Medicine)     Plan:    Pap w/ HPV- last done today Needs  flu shot and prevnar-13 today.   Needs zostavax rx again - didn't ever get shingles vaccines Hep C, HIV, ua, cbc, cmp, lipid, tsh EKG - needs WTM   Couldn't get hiv, tsh, cbc covered  During the course of the visit the patient was educated and counseled about the following appropriate screening and preventive services:   Vaccines to include Pneumoccal, Influenza, Hepatitis B, Td, Zostavax,  HCV  Electrocardiogram  Cardiovascular Disease  Colorectal cancer screening  Bone density screening  Diabetes screening  Glaucoma screening  Mammography/PAP  Nutrition counseling  Preventative Care: Breast Cancer: Has mammogram 06/2015 nml - set up for next month Cervical/pelvic cancer: pap smear 02/2013 was normal, no HPV testing CRS: has virtual CT colonoscopy 2009 Bone health: dexa 06/2015 shows osteopenia with T score of -1.3 at right femur - has multivitamin  Immunizatoins: TDaP 2016. Gave zostavax rx at last visit, flu shot annually, pneumovax-23 in 2011 otc supp/vit: on mvi, ca/vit D Optho/dentist - regularly.  [place]  Her current medications and allergies were reviewed and needed refills of her chronic medications were ordered. The plan for yearly health maintenance was discussed and all orders and referrals were made as appropriate.  Patient Instructions (the written plan) was given to the patient.    Orders Placed This Encounter  Procedures  . US THYROID    EPIC ORDER/wt 182/no needs/mcr and bcbs/plm and pt with epic order       Standing Status:   Future    Standing Expiration Date:    07/27/2017    Order Specific Question:   Reason for Exam (SYMPTOM  OR DIAGNOSIS REQUIRED)    Answer:   enlarged nodule on right lobe    Order Specific Question:   Preferred imaging location?    Answer:   [facility]  . Flu Vaccine QUAD 36+ mos IM  . Pneumococcal conjugate vaccine 13-valent IM  . Comprehensive metabolic panel    Order Specific Question:   Has the patient fasted?    Answer:   Yes  . Hepatitis C Antibody  . Thyroid Panel With TSH  . POCT urinalysis dipstick  . EKG 12-Lead    Meds ordered this encounter  Medications  . Zoster Vaccine Live, PF, (ZOSTAVAX) [id number] UNT/0.65ML injection    Sig: Inject 19,400 Units into the skin once.    Dispense:  1 vial    Refill:  0  . omeprazole (PRILOSEC) 40 MG capsule    Sig: Take 1 capsule (40 mg total) by mouth daily.    Dispense:  30 capsule    Refill:  11  . lisinopril-hydrochlorothiazide (PRINZIDE,ZESTORETIC) 10-12.5 MG tablet    Sig: TAKE 1 TABLET BY MOUTH DAILY    Dispense:  90 tablet    Refill:  3  . fluticasone (FLONASE) 50 MCG/ACT nasal spray    Sig: PLACE 2 SPRAYS INTO THE NOSE DAILY.    Dispense:  16 g    Refill:  11   Norberto SorensonEva Shaw, M.D.  [facility]  [place] [address] [place], [place] [address] [phone] phone [phone] fax  06/27/16 11:15 AM  Norberto SorensonSHAW,EVA, MD 05/27/2016   Patient ID: Frances Moltervolia R Dennie, female   DOB: 10/21/1950, 65 y.o.   MRN: [id number]

## 2016-05-28 LAB — HEPATITIS C ANTIBODY: HCV AB: NEGATIVE

## 2016-05-28 LAB — THYROID PANEL WITH TSH
Free Thyroxine Index: 2.9 (ref 1.4–3.8)
T3 UPTAKE: 28 % (ref 22–35)
T4 TOTAL: 10.2 ug/dL (ref 4.5–12.0)
TSH: 2.83 m[IU]/L

## 2016-05-30 ENCOUNTER — Encounter: Payer: Self-pay | Admitting: Family Medicine

## 2016-06-01 LAB — PAP IG AND HPV HIGH-RISK: HPV DNA High Risk: NOT DETECTED

## 2016-06-02 ENCOUNTER — Other Ambulatory Visit: Payer: Self-pay | Admitting: Family Medicine

## 2016-06-02 DIAGNOSIS — Z1231 Encounter for screening mammogram for malignant neoplasm of breast: Secondary | ICD-10-CM

## 2016-06-03 DIAGNOSIS — I83811 Varicose veins of right lower extremities with pain: Secondary | ICD-10-CM | POA: Diagnosis not present

## 2016-06-10 DIAGNOSIS — H524 Presbyopia: Secondary | ICD-10-CM | POA: Diagnosis not present

## 2016-06-10 DIAGNOSIS — H04123 Dry eye syndrome of bilateral lacrimal glands: Secondary | ICD-10-CM | POA: Diagnosis not present

## 2016-06-10 DIAGNOSIS — H5213 Myopia, bilateral: Secondary | ICD-10-CM | POA: Diagnosis not present

## 2016-06-14 ENCOUNTER — Other Ambulatory Visit: Payer: Federal, State, Local not specified - PPO

## 2016-06-15 DIAGNOSIS — I83811 Varicose veins of right lower extremities with pain: Secondary | ICD-10-CM | POA: Diagnosis not present

## 2016-06-22 ENCOUNTER — Other Ambulatory Visit: Payer: Self-pay | Admitting: Family Medicine

## 2016-06-22 DIAGNOSIS — I83811 Varicose veins of right lower extremities with pain: Secondary | ICD-10-CM | POA: Diagnosis not present

## 2016-06-22 DIAGNOSIS — E079 Disorder of thyroid, unspecified: Secondary | ICD-10-CM

## 2016-06-29 ENCOUNTER — Ambulatory Visit
Admission: RE | Admit: 2016-06-29 | Discharge: 2016-06-29 | Disposition: A | Discharge status: Home / Self Care | Payer: Federal, State, Local not specified - PPO | Source: Ambulatory Visit | Attending: Family Medicine | Admitting: Family Medicine

## 2016-06-29 DIAGNOSIS — E042 Nontoxic multinodular goiter: Secondary | ICD-10-CM | POA: Diagnosis not present

## 2016-06-29 DIAGNOSIS — E079 Disorder of thyroid, unspecified: Secondary | ICD-10-CM

## 2016-07-07 ENCOUNTER — Ambulatory Visit
Admission: RE | Admit: 2016-07-07 | Discharge: 2016-07-07 | Disposition: A | Discharge status: Home / Self Care | Payer: Federal, State, Local not specified - PPO | Source: Ambulatory Visit | Attending: Family Medicine | Admitting: Family Medicine

## 2016-07-07 ENCOUNTER — Encounter: Payer: Self-pay | Admitting: Radiology

## 2016-07-07 ENCOUNTER — Telehealth: Payer: Self-pay | Admitting: Radiology

## 2016-07-07 DIAGNOSIS — Z1231 Encounter for screening mammogram for malignant neoplasm of breast: Secondary | ICD-10-CM | POA: Diagnosis not present

## 2016-07-07 NOTE — Telephone Encounter (Signed)
Spoke with pt and provided US results.

## 2016-08-05 ENCOUNTER — Other Ambulatory Visit: Payer: Self-pay | Admitting: Family Medicine

## 2016-08-13 ENCOUNTER — Other Ambulatory Visit: Payer: Self-pay | Admitting: Family Medicine

## 2016-09-03 DIAGNOSIS — I872 Venous insufficiency (chronic) (peripheral): Secondary | ICD-10-CM

## 2017-06-08 ENCOUNTER — Other Ambulatory Visit: Payer: Self-pay | Admitting: Family Medicine

## 2017-06-08 NOTE — Telephone Encounter (Signed)
LOV 05/27/16 with Dr. Clelia CroftShaw. / Refill request for flonase / Patient has a CPE scheduled for 06/11/2017 /

## 2017-06-11 ENCOUNTER — Encounter: Payer: Self-pay | Admitting: Family Medicine

## 2017-06-11 ENCOUNTER — Ambulatory Visit (INDEPENDENT_AMBULATORY_CARE_PROVIDER_SITE_OTHER): Payer: Medicare Other | Admitting: Family Medicine

## 2017-06-11 VITALS — BP 127/85 | HR 87 | Temp 98.2°F | Resp 16 | Ht 62.5 in | Wt 183.0 lb

## 2017-06-11 DIAGNOSIS — Z1239 Encounter for other screening for malignant neoplasm of breast: Secondary | ICD-10-CM

## 2017-06-11 DIAGNOSIS — R3121 Asymptomatic microscopic hematuria: Secondary | ICD-10-CM | POA: Diagnosis not present

## 2017-06-11 DIAGNOSIS — Z5181 Encounter for therapeutic drug level monitoring: Secondary | ICD-10-CM | POA: Diagnosis not present

## 2017-06-11 DIAGNOSIS — Z23 Encounter for immunization: Secondary | ICD-10-CM | POA: Diagnosis not present

## 2017-06-11 DIAGNOSIS — J309 Allergic rhinitis, unspecified: Secondary | ICD-10-CM | POA: Diagnosis not present

## 2017-06-11 DIAGNOSIS — Z0000 Encounter for general adult medical examination without abnormal findings: Secondary | ICD-10-CM

## 2017-06-11 DIAGNOSIS — Z1212 Encounter for screening for malignant neoplasm of rectum: Secondary | ICD-10-CM

## 2017-06-11 DIAGNOSIS — I872 Venous insufficiency (chronic) (peripheral): Secondary | ICD-10-CM

## 2017-06-11 DIAGNOSIS — I10 Essential (primary) hypertension: Secondary | ICD-10-CM

## 2017-06-11 DIAGNOSIS — Z1231 Encounter for screening mammogram for malignant neoplasm of breast: Secondary | ICD-10-CM | POA: Diagnosis not present

## 2017-06-11 DIAGNOSIS — Z1211 Encounter for screening for malignant neoplasm of colon: Secondary | ICD-10-CM

## 2017-06-11 DIAGNOSIS — M85859 Other specified disorders of bone density and structure, unspecified thigh: Secondary | ICD-10-CM | POA: Diagnosis not present

## 2017-06-11 DIAGNOSIS — Z780 Asymptomatic menopausal state: Secondary | ICD-10-CM

## 2017-06-11 DIAGNOSIS — K219 Gastro-esophageal reflux disease without esophagitis: Secondary | ICD-10-CM | POA: Diagnosis not present

## 2017-06-11 LAB — POCT URINALYSIS DIP (MANUAL ENTRY)
BILIRUBIN UA: NEGATIVE
BILIRUBIN UA: NEGATIVE mg/dL
GLUCOSE UA: NEGATIVE mg/dL
Nitrite, UA: NEGATIVE
Protein Ur, POC: NEGATIVE mg/dL
SPEC GRAV UA: 1.015 (ref 1.010–1.025)
Urobilinogen, UA: 0.2 E.U./dL
pH, UA: 6 (ref 5.0–8.0)

## 2017-06-11 LAB — POC MICROSCOPIC URINALYSIS (UMFC): MUCUS RE: ABSENT

## 2017-06-11 MED ORDER — ZOSTER VAC RECOMB ADJUVANTED 50 MCG/0.5ML IM SUSR: 0.5000 mL | Freq: Once | INTRAMUSCULAR | 1 refills | Status: AC

## 2017-06-11 MED ORDER — LISINOPRIL-HYDROCHLOROTHIAZIDE 10-12.5 MG PO TABS: ORAL_TABLET | ORAL | 3 refills | Status: DC

## 2017-06-11 MED ORDER — FLUTICASONE PROPIONATE 50 MCG/ACT NA SUSP: 2.0000 | Freq: Every day | NASAL | 11 refills | Status: DC

## 2017-06-11 MED ORDER — OMEPRAZOLE 40 MG PO CPDR: 40.0000 mg | DELAYED_RELEASE_CAPSULE | Freq: Every day | ORAL | 11 refills | Status: DC

## 2017-06-11 NOTE — Progress Notes (Signed)
Subjective:  By signing my name below, I, Frances Hunter, attest that this documentation has been prepared under the direction and in the presence of Frances Sorenson, MD. Electronically Signed: Stann Hunter, Scribe. 06/11/2017 , 10:21 AM .  Patient was seen in Room 1 .   Patient ID: Frances Hunter, female    DOB: 1951-02-16, 66 y.o.   MRN: [id number] Chief Complaint  Patient presents with  . Annual Exam  . Flu Vaccine    65+   HPI Frances Hunter is a 66 y.o. female who presents to Primary Care at [facility] for annual physical. She's been doing well. She's fasting today.   Immunizations She updated pneumonia shot today.  She received prescription for shingrex today.  She also received flu shot today.   Cancer Screening Colonoscopy: she is due; last done was a virtual CT colonoscopy done 10 years ago. She agrees to Cologuard stool testing.  Mammogram: planning to schedule mammogram next month.  Bone scan: will do with mammogram.  Pelvic: She denies vaginal bleeding, vaginal discharge or change in bowels or bladder.   Chronic medical conditions She checks her BP at home and at [facility].  Indigestion: She's been eating smaller meals. She takes omeprazole PRN.  Season allergies: uses flonase as needed.   Vision/dentist She plans to schedule an appointment with ophthalmologist.   Supplements She takes calcium and Vitamin D. She's been trying to drink more milk too.   Exercise She was more active when she was working, but she's been trying to walk and doing some weight lifting.   Past Medical History:  Diagnosis Date  . Allergy   . Hypertension    Prior to Admission medications   Medication Sig Start Date End Date Taking? Authorizing Provider  fluticasone (FLONASE) 50 MCG/ACT nasal spray PLACE 2 SPRAYS INTO THE NOSE DAILY. 06/08/17   Sherren Mocha, MD  lisinopril-hydrochlorothiazide (PRINZIDE,ZESTORETIC) 10-12.5 MG tablet TAKE 1 TABLET BY MOUTH DAILY 05/27/16   Sherren Mocha, MD  lisinopril-hydrochlorothiazide (PRINZIDE,ZESTORETIC) 10-12.5 MG tablet TAKE 1 TABLET BY MOUTH DAILY 08/13/16   Sherren Mocha, MD  omeprazole (PRILOSEC) 40 MG capsule Take 1 capsule (40 mg total) by mouth daily. 05/27/16   Sherren Mocha, MD   No Known Allergies  Past Surgical History:  Procedure Laterality Date  . TUBAL LIGATION     Family History  Problem Relation Age of Onset  . Cancer Father 66       lymphoma  . Hypertension Father   . Diabetes Brother   . Hyperlipidemia Brother   . Hypertension Brother   . Diabetes Maternal Grandfather   . Cancer Paternal Grandfather   . Liver disease Mother 48       cyst  . Hyperlipidemia Sister    Social History   Socioeconomic History  . Marital status: Married    Spouse name: None  . Number of children: None  . Years of education: None  . Highest education level: None  Social Needs  . Financial resource strain: None  . Food insecurity - worry: None  . Food insecurity - inability: None  . Transportation needs - medical: None  . Transportation needs - non-medical: None  Occupational History  . Occupation: Retired  Tobacco Use  . Smoking status: Never Smoker  . Smokeless tobacco: Never Used  Substance and Sexual Activity  . Alcohol use: Yes    Alcohol/week: 1.2 oz    Types: 2 Glasses of wine per week  .  Drug use: No  . Sexual activity: Yes  Other Topics Concern  . None  Social History Narrative   Married. College: Yes. Exercise: Yes.   [facility] 06/11/2017 05/27/2016 12/31/2015 05/17/2014  Decreased Interest 0 0 0 0  Down, Depressed, Hopeless 0 0 0 0  PHQ - 2 Score 0 0 0 0    Review of Systems  Constitutional: Negative for chills, fatigue, fever and unexpected weight change.  HENT: Positive for rhinorrhea and sneezing.   Eyes: Positive for itching.  Respiratory: Positive for cough.   Gastrointestinal: Negative for constipation, diarrhea, nausea and vomiting.  Musculoskeletal: Positive for neck  stiffness.  Skin: Negative for rash and wound.  Allergic/Immunologic: Positive for environmental allergies.  Neurological: Negative for dizziness, weakness and headaches.  Hematological: Bruises/bleeds easily.       Objective:   Physical Exam  Constitutional: She is oriented to person, place, and time. She appears well-developed and well-nourished. No distress.  HENT:  Head: Normocephalic and atraumatic.  Cerumen impacted in bilateral ear canals, severe rhinitis in left nasal passage, erythema and postnasal drip in oropharynx  Eyes: EOM are normal. Pupils are equal, round, and reactive to light.  Neck: Neck supple. No thyromegaly present.  Cardiovascular: Normal rate.  Pulmonary/Chest: Effort normal. No respiratory distress.  Musculoskeletal: Normal range of motion.  Lymphadenopathy:    She has no cervical adenopathy.  Neurological: She is alert and oriented to person, place, and time.  Skin: Skin is warm and dry.  Psychiatric: She has a normal mood and affect. Her behavior is normal.  Nursing note and vitals reviewed.   BP 127/85   Pulse 87   Temp 98.2 F (36.8 C) (Oral)   Resp 16   Ht 5' 2.5" (1.588 m)   Wt 183 lb (83 kg)   SpO2 93%   BMI 32.94 kg/m      Assessment & Plan:   1. Annual physical exam   2. Need for prophylactic vaccination and inoculation against influenza   3. Need for pneumococcal vaccination   4. Medication monitoring encounter   5. Essential hypertension   6. Chronic venous insufficiency   7. Allergic rhinitis, unspecified seasonality, unspecified trigger   8. Gastroesophageal reflux disease, esophagitis presence not specified   9. Osteopenia of thigh, unspecified laterality   10. Screening for colorectal cancer   11. Screening for breast cancer   12. Postmenopausal estrogen deficiency   13. Asymptomatic microscopic hematuria     Orders Placed This Encounter  Procedures  . Urine Culture  . DG Bone Density    Standing Status:   Future     Standing Expiration Date:   08/11/2018    Order Specific Question:   Reason for Exam (SYMPTOM  OR DIAGNOSIS REQUIRED)    Answer:   postmenopausal estrogen deficiency    Order Specific Question:   Preferred imaging [facility]?    [facility]  . MM Digital Screening    Standing Status:   Future    Standing Expiration Date:   08/11/2018    Order Specific Question:   Reason for Exam (SYMPTOM  OR DIAGNOSIS REQUIRED)    Answer:   screening    Order Specific Question:   Preferred imaging location?    Answer[facility]  . Pneumococcal polysaccharide vaccine 23-valent greater than or equal to 2yo subcutaneous/IM  . Flu Vaccine QUAD 36+ mos IM  . Comprehensive metabolic panel  . CBC  . Lipid panel  Order Specific Question:   Has the patient fasted?    Answer:   Yes  . Cologuard  . POCT urinalysis dipstick  . POCT Microscopic Urinalysis ([facility])  . EKG 12-Lead    Meds ordered this encounter  Medications  . Zoster Vaccine Adjuvanted [facility](SHINGRIX) injection    Sig: Inject 0.5 mLs once for 1 dose into the muscle. Repeat once in 2-6 months.    Dispense:  0.5 mL    Refill:  1  . fluticasone (FLONASE) 50 MCG/ACT nasal spray    Sig: Place 2 sprays daily into both nostrils.    Dispense:  16 g    Refill:  11  . lisinopril-hydrochlorothiazide (PRINZIDE,ZESTORETIC) 10-12.5 MG tablet    Sig: TAKE 1 TABLET BY MOUTH DAILY    Dispense:  90 tablet    Refill:  3  . omeprazole (PRILOSEC) 40 MG capsule    Sig: Take 1 capsule (40 mg total) daily by mouth.    Dispense:  30 capsule    Refill:  11    I personally performed the services described in this documentation, which was scribed in my presence. The recorded information has been reviewed and considered, and addended by me as needed.   Frances SorensonEva Shaw, M.D.  Primary [facility] at [facility]  [place] [address] [place], [place] [address] [phone] phone [phone] fax  06/11/17 11:50 PM

## 2017-06-11 NOTE — Patient Instructions (Addendum)
IF you received an x-ray today, you will receive an invoice from [facility]. Please contact [facility] at [phone] with questions or concerns regarding your invoice.   IF you received labwork today, you will receive an invoice from [facility]. Please contact [facility] at [phone] with questions or concerns regarding your invoice.   Our billing staff will not be able to assist you with questions regarding bills from these companies.  You will be contacted with the lab results as soon as they are available. The fastest way to get your results is to activate your My Chart account. Instructions are located on the last page of this paperwork. If you have not heard from [place] regarding the results in 2 weeks, please contact this office.     Calcium Intake Recommendations Calcium is a mineral that affects many functions in the body, including:  Blood clotting.  Blood vessel function.  Nerve impulse conduction.  Hormone secretion.  Muscle contraction.  Bone and teeth functions.  Most of your body's calcium supply is stored in your bones and teeth. When your calcium stores are low, you may be at risk for low bone mass, bone loss, and bone fractures. Consuming enough calcium helps to grow healthy bones and teeth and to prevent breakdown over time. It is very important that you get enough calcium if you are:  A child undergoing rapid growth.  An adolescent girl.  A pre- or post-menopausal woman.  A woman whose menstrual cycle has stopped due to anorexia nervosa or regular intense exercise.  An individual with lactose intolerance or a milk allergy.  A vegetarian.  What is my plan? Try to consume the recommended amount of calcium daily based on your age. Depending on your overall health, your health care provider may recommend increased calcium intake.General daily calcium intake recommendations by age are:  Birth to 6 months: 200 mg.  Infants 7 to 12  months: 260 mg.  Children 1 to 3 years: 700 mg.  Children 4 to 8 years: 1,000 mg.  Children 9 to 13 years: 1,300 mg.  Teens 14 to 18 years: 1,300 mg.  Adults 19 to 50 years: 1,000 mg.  Adult women 51 to 70 years: 1,200 mg.  Adult men 51 to 70 years: 1,000 mg.  Adults 71 years and older: 1,200 mg.  Pregnant and breastfeeding teens: 1,300 mg.  Pregnant and breastfeeding adults: 1,000 mg.  What do I need to know about calcium intake?  In order for the body to absorb calcium, it needs vitamin D. You can get vitamin D through: ? Direct exposure of the skin to sunlight. ? Foods, such as egg yolks, liver, saltwater fish, and fortified milk. ? Supplements.  Consuming too much calcium may cause: ? Constipation. ? Decreased absorption of iron and zinc. ? Kidney stones.  Calcium supplements may interact with certain medicines. Check with your health care provider before starting any calcium supplements.  Try to get most of your calcium from food. What foods can I eat? Grains  Fortified oatmeal. Fortified ready-to-eat cereals. Fortified frozen waffles. Vegetables Turnip greens. Broccoli. Fruits Fortified orange juice. Meats and Other Protein Sources Canned sardines with bones. Canned salmon with bones. Soy beans. Tofu. Baked beans. Almonds. [place] nuts. Sunflower seeds. Dairy Milk. Yogurt. Cheese. Cottage cheese. Beverages Fortified soy milk. Fortified rice milk. Sweets/Desserts Pudding. Ice Cream. Milkshakes. Blackstrap molasses. The items listed above may not be a complete list of recommended foods or beverages. Contact your dietitian for more options. What  foods can affect my calcium intake? It may be more difficult for your body to use calcium or calcium may leave your body more quickly if you consume large amounts of:  Sodium.  Protein.  Caffeine.  Alcohol.  This information is not intended to replace advice given to you by your health care provider. Make  sure you discuss any questions you have with your health care provider. Document Released: 02/24/2004 Document Revised: 01/30/2016 Document Reviewed: 12/18/2013 Elsevier Interactive Patient Education  2018 [place] Maintenance for Postmenopausal Women Menopause is a normal process in which your reproductive ability comes to an end. This process happens gradually over a span of months to years, usually between the ages of 49 and 25. Menopause is complete when you have missed 12 consecutive menstrual periods. It is important to talk with your health care provider about some of the most common conditions that affect postmenopausal women, such as heart disease, cancer, and bone loss (osteoporosis). Adopting a healthy lifestyle and getting preventive care can help to promote your health and wellness. Those actions can also lower your chances of developing some of these common conditions. What should I know about menopause? During menopause, you may experience a number of symptoms, such as:  Moderate-to-severe hot flashes.  Night sweats.  Decrease in sex drive.  Mood swings.  Headaches.  Tiredness.  Irritability.  Memory problems.  Insomnia.  Choosing to treat or not to treat menopausal changes is an individual decision that you make with your health care provider. What should I know about hormone replacement therapy and supplements? Hormone therapy products are effective for treating symptoms that are associated with menopause, such as hot flashes and night sweats. Hormone replacement carries certain risks, especially as you become older. If you are thinking about using estrogen or estrogen with progestin treatments, discuss the benefits and risks with your health care provider. What should I know about heart disease and stroke? Heart disease, heart attack, and stroke become more likely as you age. This may be due, in part, to the hormonal changes that your body experiences  during menopause. These can affect how your body processes dietary fats, triglycerides, and cholesterol. Heart attack and stroke are both medical emergencies. There are many things that you can do to help prevent heart disease and stroke:  Have your blood pressure checked at least every 1-2 years. High blood pressure causes heart disease and increases the risk of stroke.  If you are 77-70 years old, ask your health care provider if you should take aspirin to prevent a heart attack or a stroke.  Do not use any tobacco products, including cigarettes, chewing tobacco, or electronic cigarettes. If you need help quitting, ask your health care provider.  It is important to eat a healthy diet and maintain a healthy weight. ? Be sure to include plenty of vegetables, fruits, low-fat dairy products, and lean protein. ? Avoid eating foods that are high in solid fats, added sugars, or salt (sodium).  Get regular exercise. This is one of the most important things that you can do for your health. ? Try to exercise for at least 150 minutes each week. The type of exercise that you do should increase your heart rate and make you sweat. This is known as moderate-intensity exercise. ? Try to do strengthening exercises at least twice each week. Do these in addition to the moderate-intensity exercise.  Know your numbers.Ask your health care provider to check your cholesterol and your blood glucose.  Continue to have your blood tested as directed by your health care provider.  What should I know about cancer screening? There are several types of cancer. Take the following steps to reduce your risk and to catch any cancer development as early as possible. Breast Cancer  Practice breast self-awareness. ? This means understanding how your breasts normally appear and feel. ? It also means doing regular breast self-exams. Let your health care provider know about any changes, no matter how small.  If you are 10 or  older, have a clinician do a breast exam (clinical breast exam or CBE) every year. Depending on your age, family history, and medical history, it may be recommended that you also have a yearly breast X-ray (mammogram).  If you have a family history of breast cancer, talk with your health care provider about genetic screening.  If you are at high risk for breast cancer, talk with your health care provider about having an MRI and a mammogram every year.  Breast cancer (BRCA) gene test is recommended for women who have family members with BRCA-related cancers. Results of the assessment will determine the need for genetic counseling and BRCA1 and for BRCA2 testing. BRCA-related cancers include these types: ? Breast. This occurs in males or females. ? Ovarian. ? Tubal. This may also be called fallopian tube cancer. ? Cancer of the abdominal or pelvic lining (peritoneal cancer). ? Prostate. ? Pancreatic.  Cervical, Uterine, and Ovarian Cancer Your health care provider may recommend that you be screened regularly for cancer of the pelvic organs. These include your ovaries, uterus, and vagina. This screening involves a pelvic exam, which includes checking for microscopic changes to the surface of your cervix (Pap test).  For women ages 21-65, health care providers may recommend a pelvic exam and a Pap test every three years. For women ages 34-65, they may recommend the Pap test and pelvic exam, combined with testing for human papilloma virus (HPV), every five years. Some types of HPV increase your risk of cervical cancer. Testing for HPV may also be done on women of any age who have unclear Pap test results.  Other health care providers may not recommend any screening for nonpregnant women who are considered low risk for pelvic cancer and have no symptoms. Ask your health care provider if a screening pelvic exam is right for you.  If you have had past treatment for cervical cancer or a condition that  could lead to cancer, you need Pap tests and screening for cancer for at least 20 years after your treatment. If Pap tests have been discontinued for you, your risk factors (such as having a new sexual partner) need to be reassessed to determine if you should start having screenings again. Some women have medical problems that increase the chance of getting cervical cancer. In these cases, your health care provider may recommend that you have screening and Pap tests more often.  If you have a family history of uterine cancer or ovarian cancer, talk with your health care provider about genetic screening.  If you have vaginal bleeding after reaching menopause, tell your health care provider.  There are currently no reliable tests available to screen for ovarian cancer.  Lung Cancer Lung cancer screening is recommended for adults 44-73 years old who are at high risk for lung cancer because of a history of smoking. A yearly low-dose CT scan of the lungs is recommended if you:  Currently smoke.  Have a history of at least  30 pack-years of smoking and you currently smoke or have quit within the past 15 years. A pack-year is smoking an average of one pack of cigarettes per day for one year.  Yearly screening should:  Continue until it has been 15 years since you quit.  Stop if you develop a health problem that would prevent you from having lung cancer treatment.  Colorectal Cancer  This type of cancer can be detected and can often be prevented.  Routine colorectal cancer screening usually begins at age 90 and continues through age 83.  If you have risk factors for colon cancer, your health care provider may recommend that you be screened at an earlier age.  If you have a family history of colorectal cancer, talk with your health care provider about genetic screening.  Your health care provider may also recommend using home test kits to check for hidden blood in your stool.  A small camera  at the end of a tube can be used to examine your colon directly (sigmoidoscopy or colonoscopy). This is done to check for the earliest forms of colorectal cancer.  Direct examination of the colon should be repeated every 5-10 years until age 19. However, if early forms of precancerous polyps or small growths are found or if you have a family history or genetic risk for colorectal cancer, you may need to be screened more often.  Skin Cancer  Check your skin from head to toe regularly.  Monitor any moles. Be sure to tell your health care provider: ? About any new moles or changes in moles, especially if there is a change in a mole's shape or color. ? If you have a mole that is larger than the size of a pencil eraser.  If any of your family members has a history of skin cancer, especially at a young age, talk with your health care provider about genetic screening.  Always use sunscreen. Apply sunscreen liberally and repeatedly throughout the day.  Whenever you are outside, protect yourself by wearing long sleeves, pants, a wide-brimmed hat, and sunglasses.  What should I know about osteoporosis? Osteoporosis is a condition in which bone destruction happens more quickly than new bone creation. After menopause, you may be at an increased risk for osteoporosis. To help prevent osteoporosis or the bone fractures that can happen because of osteoporosis, the following is recommended:  If you are 74-44 years old, get at least 1,000 mg of calcium and at least 600 mg of vitamin D per day.  If you are older than age 55 but younger than age 74, get at least 1,200 mg of calcium and at least 600 mg of vitamin D per day.  If you are older than age 28, get at least 1,200 mg of calcium and at least 800 mg of vitamin D per day.  Smoking and excessive alcohol intake increase the risk of osteoporosis. Eat foods that are rich in calcium and vitamin D, and do weight-bearing exercises several times each week as  directed by your health care provider. What should I know about how menopause affects my mental health? Depression may occur at any age, but it is more common as you become older. Common symptoms of depression include:  Low or sad mood.  Changes in sleep patterns.  Changes in appetite or eating patterns.  Feeling an overall lack of motivation or enjoyment of activities that you previously enjoyed.  Frequent crying spells.  Talk with your health care provider if you think  that you are experiencing depression. What should I know about immunizations? It is important that you get and maintain your immunizations. These include:  Tetanus, diphtheria, and pertussis (Tdap) booster vaccine.  Influenza every year before the flu season begins.  Pneumonia vaccine.  Shingles vaccine.  Your health care provider may also recommend other immunizations. This information is not intended to replace advice given to you by your health care provider. Make sure you discuss any questions you have with your health care provider. Document Released: 09/03/2005 Document Revised: 01/30/2016 Document Reviewed: 04/15/2015 Elsevier Interactive Patient Education  2018 [employer]  

## 2017-06-12 LAB — COMPREHENSIVE METABOLIC PANEL
A/G RATIO: 1.3 (ref 1.2–2.2)
ALT: 19 IU/L (ref 0–32)
AST: 18 IU/L (ref 0–40)
Albumin: 4.1 g/dL (ref 3.6–4.8)
Alkaline Phosphatase: 75 IU/L (ref 39–117)
BILIRUBIN TOTAL: 0.4 mg/dL (ref 0.0–1.2)
BUN/Creatinine Ratio: 22 (ref 12–28)
BUN: 19 mg/dL (ref 8–27)
CALCIUM: 9.5 mg/dL (ref 8.7–10.3)
CHLORIDE: 102 mmol/L (ref 96–106)
CO2: 28 mmol/L (ref 20–29)
Creatinine, Ser: 0.85 mg/dL (ref 0.57–1.00)
GFR, EST AFRICAN AMERICAN: 83 mL/min/{1.73_m2} (ref >59)
GFR, EST NON AFRICAN AMERICAN: 72 mL/min/{1.73_m2} (ref >59)
GLOBULIN, TOTAL: 3.2 g/dL (ref 1.5–4.5)
Glucose: 86 mg/dL (ref 65–99)
POTASSIUM: 3.8 mmol/L (ref 3.5–5.2)
SODIUM: 143 mmol/L (ref 134–144)
TOTAL PROTEIN: 7.3 g/dL (ref 6.0–8.5)

## 2017-06-12 LAB — CBC
Hematocrit: 40 % (ref 34.0–46.6)
Hemoglobin: 13.1 g/dL (ref 11.1–15.9)
MCH: 30.1 pg (ref 26.6–33.0)
MCHC: 32.8 g/dL (ref 31.5–35.7)
MCV: 92 fL (ref 79–97)
PLATELETS: 333 10*3/uL (ref 150–379)
RBC: 4.35 x10E6/uL (ref 3.77–5.28)
RDW: 13.7 % (ref 12.3–15.4)
WBC: 5.3 10*3/uL (ref 3.4–10.8)

## 2017-06-12 LAB — LIPID PANEL
CHOLESTEROL TOTAL: 144 mg/dL (ref 100–199)
Chol/HDL Ratio: 2.6 ratio (ref 0.0–4.4)
HDL: 56 mg/dL (ref >39)
LDL Calculated: 77 mg/dL (ref 0–99)
Triglycerides: 54 mg/dL (ref 0–149)
VLDL Cholesterol Cal: 11 mg/dL (ref 5–40)

## 2017-06-12 LAB — URINE CULTURE: ORGANISM ID, BACTERIA: NO GROWTH

## 2017-07-01 ENCOUNTER — Other Ambulatory Visit: Payer: Self-pay | Admitting: Family Medicine

## 2017-07-01 DIAGNOSIS — M8580 Other specified disorders of bone density and structure, unspecified site: Secondary | ICD-10-CM

## 2017-07-14 DIAGNOSIS — I83893 Varicose veins of bilateral lower extremities with other complications: Secondary | ICD-10-CM | POA: Diagnosis not present

## 2017-07-14 DIAGNOSIS — I83812 Varicose veins of left lower extremities with pain: Secondary | ICD-10-CM | POA: Diagnosis not present

## 2017-07-15 DIAGNOSIS — H52223 Regular astigmatism, bilateral: Secondary | ICD-10-CM | POA: Diagnosis not present

## 2017-07-15 DIAGNOSIS — H2513 Age-related nuclear cataract, bilateral: Secondary | ICD-10-CM | POA: Diagnosis not present

## 2017-07-15 DIAGNOSIS — H5213 Myopia, bilateral: Secondary | ICD-10-CM | POA: Diagnosis not present

## 2017-07-15 DIAGNOSIS — H524 Presbyopia: Secondary | ICD-10-CM | POA: Diagnosis not present

## 2017-08-03 ENCOUNTER — Ambulatory Visit
Admission: RE | Admit: 2017-08-03 | Discharge: 2017-08-03 | Disposition: A | Discharge status: Home / Self Care | Payer: Federal, State, Local not specified - PPO | Source: Ambulatory Visit | Attending: Family Medicine | Admitting: Family Medicine

## 2017-08-03 DIAGNOSIS — Z1212 Encounter for screening for malignant neoplasm of rectum: Secondary | ICD-10-CM | POA: Diagnosis not present

## 2017-08-03 DIAGNOSIS — M85851 Other specified disorders of bone density and structure, right thigh: Secondary | ICD-10-CM | POA: Diagnosis not present

## 2017-08-03 DIAGNOSIS — Z1239 Encounter for other screening for malignant neoplasm of breast: Secondary | ICD-10-CM

## 2017-08-03 DIAGNOSIS — M8580 Other specified disorders of bone density and structure, unspecified site: Secondary | ICD-10-CM

## 2017-08-03 DIAGNOSIS — Z1231 Encounter for screening mammogram for malignant neoplasm of breast: Secondary | ICD-10-CM | POA: Diagnosis not present

## 2017-08-03 DIAGNOSIS — Z1211 Encounter for screening for malignant neoplasm of colon: Secondary | ICD-10-CM | POA: Diagnosis not present

## 2017-08-03 DIAGNOSIS — Z780 Asymptomatic menopausal state: Secondary | ICD-10-CM | POA: Diagnosis not present

## 2017-08-05 DIAGNOSIS — I83893 Varicose veins of bilateral lower extremities with other complications: Secondary | ICD-10-CM | POA: Diagnosis not present

## 2017-08-16 LAB — COLOGUARD: COLOGUARD: NEGATIVE

## 2017-08-19 ENCOUNTER — Encounter: Payer: Self-pay | Admitting: Radiology

## 2017-08-24 ENCOUNTER — Encounter: Payer: Self-pay | Admitting: Family Medicine

## 2017-08-26 DIAGNOSIS — I8311 Varicose veins of right lower extremity with inflammation: Secondary | ICD-10-CM | POA: Diagnosis not present

## 2017-08-26 DIAGNOSIS — I83811 Varicose veins of right lower extremities with pain: Secondary | ICD-10-CM | POA: Diagnosis not present

## 2017-11-29 DIAGNOSIS — I8312 Varicose veins of left lower extremity with inflammation: Secondary | ICD-10-CM | POA: Diagnosis not present

## 2017-12-27 DIAGNOSIS — I8312 Varicose veins of left lower extremity with inflammation: Secondary | ICD-10-CM | POA: Diagnosis not present

## 2017-12-27 DIAGNOSIS — I83812 Varicose veins of left lower extremities with pain: Secondary | ICD-10-CM | POA: Diagnosis not present

## 2018-01-19 DIAGNOSIS — I83812 Varicose veins of left lower extremities with pain: Secondary | ICD-10-CM | POA: Diagnosis not present

## 2018-01-19 DIAGNOSIS — I8312 Varicose veins of left lower extremity with inflammation: Secondary | ICD-10-CM | POA: Diagnosis not present

## 2018-02-09 ENCOUNTER — Other Ambulatory Visit: Payer: Self-pay | Admitting: Family Medicine

## 2018-06-29 ENCOUNTER — Encounter: Payer: Medicare Other | Admitting: Family Medicine

## 2018-08-01 ENCOUNTER — Telehealth: Payer: Self-pay | Admitting: Family Medicine

## 2018-08-01 NOTE — Telephone Encounter (Signed)
LVM for pt to call the office and reschedule their appt that was scheduled with Dr. Shaw on 08/03/18. Dr. Shaw is out on leave and will not be in the office. When pt calls back, please reschedule at their convenience. Thank you! °

## 2018-08-02 ENCOUNTER — Other Ambulatory Visit: Payer: Self-pay | Admitting: Family Medicine

## 2018-08-02 DIAGNOSIS — Z1231 Encounter for screening mammogram for malignant neoplasm of breast: Secondary | ICD-10-CM

## 2018-08-03 ENCOUNTER — Encounter: Payer: Medicare Other | Admitting: Family Medicine

## 2018-08-04 ENCOUNTER — Ambulatory Visit
Admission: RE | Admit: 2018-08-04 | Discharge: 2018-08-04 | Disposition: A | Discharge status: Home / Self Care | Payer: Federal, State, Local not specified - PPO | Source: Ambulatory Visit | Attending: Family Medicine | Admitting: Family Medicine

## 2018-08-04 DIAGNOSIS — Z1231 Encounter for screening mammogram for malignant neoplasm of breast: Secondary | ICD-10-CM

## 2018-08-15 DIAGNOSIS — H5213 Myopia, bilateral: Secondary | ICD-10-CM | POA: Diagnosis not present

## 2018-08-15 DIAGNOSIS — H2513 Age-related nuclear cataract, bilateral: Secondary | ICD-10-CM | POA: Diagnosis not present

## 2018-08-15 DIAGNOSIS — H52223 Regular astigmatism, bilateral: Secondary | ICD-10-CM | POA: Diagnosis not present

## 2018-08-15 DIAGNOSIS — H524 Presbyopia: Secondary | ICD-10-CM | POA: Diagnosis not present

## 2018-08-15 DIAGNOSIS — H04123 Dry eye syndrome of bilateral lacrimal glands: Secondary | ICD-10-CM | POA: Diagnosis not present

## 2018-08-17 ENCOUNTER — Telehealth: Payer: Self-pay | Admitting: Family Medicine

## 2018-08-17 NOTE — Telephone Encounter (Signed)
Called and spoke with pt regarding their appt scheduled with Dr. Clelia Croft on 08/25/18. I was able to get them rescheduled for  11/01/18 at 8:00 AM. I advised of time, building number and late policy. Pt acknowledged. I also reminded pt of fasting. Pt acknowledged.

## 2018-08-22 ENCOUNTER — Other Ambulatory Visit: Payer: Self-pay | Admitting: Family Medicine

## 2018-08-22 NOTE — Telephone Encounter (Signed)
Refill request for omeprazole; last CPE 06/11/17; next CPE rescheduled for 11/01/2018 with Dr [name], Ernesto Rutherford; attempted to contact pt to reschedule with another provider; left message on voicemail [phone]; will grant 30 day courtesy refill. Requested Prescriptions  Pending Prescriptions Disp Refills  . omeprazole (PRILOSEC) 40 MG capsule [Pharmacy Med Name: OMEPRAZOLE DR 40 MG CAPSULE] 90 capsule 3    Sig: TAKE 1 CAPSULE (40 MG TOTAL) DAILY BY MOUTH.     Gastroenterology: Proton Pump Inhibitors Failed - 08/22/2018  1:21 AM      Failed - Valid encounter within last 12 months    Recent Outpatient Visits          1 year ago Annual physical exam   [facility], Levell July, MD   2 years ago Annual physical exam   [facility], Levell July, MD   2 years ago Influenza-like illness   [facility], Marolyn Hammock, PA-C   3 years ago Annual physical exam   [facility], Levell July, MD   4 years ago Need for prophylactic vaccination and inoculation against influenza   [facility], Levell July, MD      Future Appointments            In 2 months Sherren Mocha, MD [facility] at [place], [facility]

## 2018-08-25 ENCOUNTER — Encounter: Payer: Medicare Other | Admitting: Family Medicine

## 2018-09-05 ENCOUNTER — Other Ambulatory Visit: Payer: Self-pay | Admitting: Family Medicine

## 2018-09-06 NOTE — Telephone Encounter (Signed)
Requested Prescriptions  Pending Prescriptions Disp Refills  . omeprazole (PRILOSEC) 40 MG capsule [Pharmacy Med Name: OMEPRAZOLE DR 40 MG CAPSULE] 60 capsule 0    Sig: TAKE 1 CAPSULE (40 MG TOTAL) BY MOUTH DAILY. OFFICE VISIT NEEDED FOR ADDITIONAL REFILLS.     Gastroenterology: Proton Pump Inhibitors Failed - 09/05/2018  8:30 PM      Failed - Valid encounter within last 12 months    Recent Outpatient Visits          1 year ago Annual physical exam   [facility], Levell July, MD   2 years ago Annual physical exam   [facility], Levell July, MD   2 years ago Influenza-like illness   [facility], Marolyn Hammock, PA-C   3 years ago Annual physical exam   [facility], Levell July, MD   4 years ago Need for prophylactic vaccination and inoculation against influenza   [facility], Levell July, MD      Future Appointments            In 1 month Sherren Mocha, MD Primary [facility] at [place], [facility]

## 2018-09-25 ENCOUNTER — Encounter: Payer: Self-pay | Admitting: Family Medicine

## 2018-09-25 ENCOUNTER — Ambulatory Visit (INDEPENDENT_AMBULATORY_CARE_PROVIDER_SITE_OTHER): Payer: Medicare Other | Admitting: Family Medicine

## 2018-09-25 ENCOUNTER — Other Ambulatory Visit: Payer: Self-pay

## 2018-09-25 VITALS — BP 136/60 | HR 82 | Temp 98.5°F | Ht 63.0 in | Wt 183.5 lb

## 2018-09-25 DIAGNOSIS — I10 Essential (primary) hypertension: Secondary | ICD-10-CM | POA: Diagnosis not present

## 2018-09-25 DIAGNOSIS — H6123 Impacted cerumen, bilateral: Secondary | ICD-10-CM | POA: Diagnosis not present

## 2018-09-25 DIAGNOSIS — M8580 Other specified disorders of bone density and structure, unspecified site: Secondary | ICD-10-CM | POA: Diagnosis not present

## 2018-09-25 DIAGNOSIS — K219 Gastro-esophageal reflux disease without esophagitis: Secondary | ICD-10-CM

## 2018-09-25 DIAGNOSIS — Z23 Encounter for immunization: Secondary | ICD-10-CM | POA: Diagnosis not present

## 2018-09-25 DIAGNOSIS — J302 Other seasonal allergic rhinitis: Secondary | ICD-10-CM | POA: Diagnosis not present

## 2018-09-25 MED ORDER — LISINOPRIL-HYDROCHLOROTHIAZIDE 10-12.5 MG PO TABS: ORAL_TABLET | ORAL | 1 refills | Status: DC

## 2018-09-25 MED ORDER — FLUTICASONE PROPIONATE 50 MCG/ACT NA SUSP: 2.0000 | Freq: Every day | NASAL | 11 refills | Status: DC

## 2018-09-25 MED ORDER — OMEPRAZOLE 40 MG PO CPDR: 40.0000 mg | DELAYED_RELEASE_CAPSULE | Freq: Every day | ORAL | 1 refills | Status: DC | PRN

## 2018-09-25 NOTE — Patient Instructions (Signed)
° ° ° °  If you have lab work done today you will be contacted with your lab results within the next 2 weeks.  If you have not heard from us then please contact us. The fastest way to get your results is to register for My Chart. ° ° °IF you received an x-ray today, you will receive an invoice from [facility]. Please contact  Radiology at [phone] with questions or concerns regarding your invoice.  ° °IF you received labwork today, you will receive an invoice from [facility]. Please contact [facility] at [phone] with questions or concerns regarding your invoice.  ° °Our billing staff will not be able to assist you with questions regarding bills from these companies. ° °You will be contacted with the lab results as soon as they are available. The fastest way to get your results is to activate your My Chart account. Instructions are located on the last page of this paperwork. If you have not heard from us regarding the results in 2 weeks, please contact this office. °  ° ° ° °

## 2018-09-25 NOTE — Progress Notes (Signed)
3/2/202010:41 AM  Frances Hunter 1951/03/25, 68 y.o. female [id number]  Chief Complaint  Patient presents with  . Hypertension    medication refill needed  prolosec, flonase    HPI:   Patient is a 68 y.o. female with past medical history significant for HTN, varicose veins, GERD, osteopenia, seasonal allergies  who presents today for medication refill  Takes a multivitamin daily and vitamin D Still consumes diary Has not been exercising as much, tries to walk  Takes omeprazole as needed Takes 2-3 x week  Takes BP medication every day wo any issues  Takes flonase as needed, does very well on it Requesting wax removal, has been having popping, fullness mostly of left ear  Lab Results  Component Value Date   CREATININE 0.85 06/11/2017   BUN 19 06/11/2017   NA 143 06/11/2017   K 3.8 06/11/2017   CL 102 06/11/2017   CO2 28 06/11/2017    Fall Risk  09/25/2018 09/25/2018 06/11/2017 05/27/2016 12/31/2015  Falls in the past year? 0 0 No No No  Number falls in past yr: 0 0 - - -  Injury with Fall? - 0 - - -     Depression screen [facility] 2/9 09/25/2018 06/11/2017 05/27/2016  Decreased Interest 0 0 0  Down, Depressed, Hopeless 0 0 0  PHQ - 2 Score 0 0 0    No Known Allergies  Prior to Admission medications   Medication Sig Start Date End Date Taking? Authorizing Provider  fluticasone (FLONASE) 50 MCG/ACT nasal spray Place 2 sprays daily into both nostrils. 06/11/17  Yes Shawnee Knapp, MD  lisinopril-hydrochlorothiazide (PRINZIDE,ZESTORETIC) 10-12.5 MG tablet TAKE 1 TABLET BY MOUTH EVERY DAY 02/10/18  Yes Shawnee Knapp, MD  omeprazole (PRILOSEC) 40 MG capsule TAKE 1 CAPSULE (40 MG TOTAL) BY MOUTH DAILY. OFFICE VISIT NEEDED FOR ADDITIONAL REFILLS. 09/06/18  Yes Shawnee Knapp, MD    Past Medical History:  Diagnosis Date  . GERD (gastroesophageal reflux disease)   . Hypertension   . Osteopenia    low Frax score, jan 2019  . Seasonal allergies     Past Surgical History:  Procedure  Laterality Date  . TUBAL LIGATION      Social History   Tobacco Use  . Smoking status: Never Smoker  . Smokeless tobacco: Never Used  Substance Use Topics  . Alcohol use: Yes    Alcohol/week: 2.0 standard drinks    Types: 2 Glasses of wine per week    Family History  Problem Relation Age of Onset  . Cancer Father 45       lymphoma  . Hypertension Father   . Diabetes Brother   . Hyperlipidemia Brother   . Hypertension Brother   . Diabetes Maternal Grandfather   . Cancer Paternal Grandfather   . Liver disease Mother 77       cyst  . Hyperlipidemia Sister   . Breast cancer Neg Hx     Review of Systems  Constitutional: Negative for chills and fever.  Respiratory: Negative for cough and shortness of breath.   Cardiovascular: Negative for chest pain, palpitations and leg swelling.  Gastrointestinal: Negative for abdominal pain, nausea and vomiting.     OBJECTIVE:  Blood pressure 136/60, pulse 82, temperature 98.5 F (36.9 C), temperature source Oral, height 5' 3"  (1.6 m), weight 183 lb 8 oz (83.2 kg), SpO2 95 %. Body mass index is 32.51 kg/m.   Physical Exam Vitals signs and nursing note reviewed.  Constitutional:  Appearance: She is well-developed.  HENT:     Head: Normocephalic and atraumatic.     Mouth/Throat:     Pharynx: No oropharyngeal exudate.  Eyes:     General: No scleral icterus.    Conjunctiva/sclera: Conjunctivae normal.     Pupils: Pupils are equal, round, and reactive to light.  Neck:     Musculoskeletal: Neck supple.  Cardiovascular:     Rate and Rhythm: Normal rate and regular rhythm.     Heart sounds: Normal heart sounds. No murmur. No friction rub. No gallop.   Pulmonary:     Effort: Pulmonary effort is normal.     Breath sounds: Normal breath sounds. No wheezing or rales.  Skin:    General: Skin is warm and dry.  Neurological:     Mental Status: She is alert and oriented to person, place, and time.    ASSESSMENT and PLAN  1.  Essential hypertension Controlled. Continue current regime.  - Lipid panel - TSH - CMP14+EGFR  2. Need for prophylactic vaccination and inoculation against influenza - Flu vaccine HIGH DOSE PF  3. Seasonal allergies Controlled. Continue current regime.   4. Osteopenia, unspecified location Discussed healthy bone LFM  5. Bilateral impacted cerumen - Ear wax removal  6. Gastroesophageal reflux disease without esophagitis Controlled. Discussed LFM. Discussed prn use of medication  Other orders - fluticasone (FLONASE) 50 MCG/ACT nasal spray; Place 2 sprays into both nostrils daily. - omeprazole (PRILOSEC) 40 MG capsule; Take 1 capsule (40 mg total) by mouth daily as needed. - lisinopril-hydrochlorothiazide (PRINZIDE,ZESTORETIC) 10-12.5 MG tablet; TAKE 1 TABLET BY MOUTH EVERY DAY    Return in about 6 months (around 03/28/2019) for CPE.    Rutherford Guys, MD Primary Care at [place], [place] [address] Ph.  [phone] Fax [phone]

## 2018-09-26 LAB — CMP14+EGFR
ALT: 20 IU/L (ref 0–32)
AST: 17 IU/L (ref 0–40)
Albumin/Globulin Ratio: 1.4 (ref 1.2–2.2)
Albumin: 3.8 g/dL (ref 3.8–4.8)
Alkaline Phosphatase: 67 IU/L (ref 39–117)
BUN/Creatinine Ratio: 23 (ref 12–28)
BUN: 18 mg/dL (ref 8–27)
Bilirubin Total: 0.2 mg/dL (ref 0.0–1.2)
CO2: 24 mmol/L (ref 20–29)
Calcium: 9.1 mg/dL (ref 8.7–10.3)
Chloride: 106 mmol/L (ref 96–106)
Creatinine, Ser: 0.78 mg/dL (ref 0.57–1.00)
GFR calc Af Amer: 91 mL/min/{1.73_m2} (ref >59)
GFR calc non Af Amer: 79 mL/min/{1.73_m2} (ref >59)
Globulin, Total: 2.8 g/dL (ref 1.5–4.5)
Glucose: 96 mg/dL (ref 65–99)
Potassium: 4 mmol/L (ref 3.5–5.2)
Sodium: 145 mmol/L — ABNORMAL HIGH (ref 134–144)
Total Protein: 6.6 g/dL (ref 6.0–8.5)

## 2018-09-26 LAB — LIPID PANEL
Chol/HDL Ratio: 2.3 ratio (ref 0.0–4.4)
Cholesterol, Total: 137 mg/dL (ref 100–199)
HDL: 59 mg/dL (ref >39)
LDL Calculated: 71 mg/dL (ref 0–99)
Triglycerides: 33 mg/dL (ref 0–149)
VLDL Cholesterol Cal: 7 mg/dL (ref 5–40)

## 2018-09-26 LAB — TSH: TSH: 4.31 u[IU]/mL (ref 0.450–4.500)

## 2018-11-01 ENCOUNTER — Encounter: Payer: Medicare Other | Admitting: Family Medicine

## 2018-11-03 ENCOUNTER — Other Ambulatory Visit: Payer: Self-pay

## 2018-11-03 ENCOUNTER — Encounter: Payer: Medicare Other | Admitting: Family Medicine

## 2018-12-19 ENCOUNTER — Other Ambulatory Visit: Payer: Self-pay | Admitting: Family Medicine

## 2018-12-26 ENCOUNTER — Telehealth: Payer: Self-pay | Admitting: Family Medicine

## 2018-12-26 NOTE — Telephone Encounter (Signed)
Is there anything that you would like for me to recommend  this pt take OTC to help her sleep or would you like me to schedule her an OV or tel-med to follow- up with you about this issue.

## 2018-12-26 NOTE — Telephone Encounter (Signed)
Copied from CRM [id number]. Topic: General - Inquiry >> Dec 26, 2018 11:32 AM Lynne Logan D wrote: Reason for CRM: Pt stated she has not been able to sleep for the past two weeks. She would like to know if Dr. Leretha Pol can send in a rx to help with this. Please advise.  [id number] [place], [place] - [address] [phone] (Phone) [phone] (Fax)

## 2018-12-27 NOTE — Telephone Encounter (Signed)
Please schedule patient to discuss new problems with insomnia. thanks

## 2018-12-28 NOTE — Telephone Encounter (Signed)
Spoke to patient appnt set

## 2019-01-02 ENCOUNTER — Other Ambulatory Visit: Payer: Self-pay

## 2019-01-02 ENCOUNTER — Encounter: Payer: Self-pay | Admitting: Family Medicine

## 2019-01-02 ENCOUNTER — Ambulatory Visit (INDEPENDENT_AMBULATORY_CARE_PROVIDER_SITE_OTHER): Payer: Medicare Other | Admitting: Family Medicine

## 2019-01-02 VITALS — BP 152/62 | HR 91 | Temp 98.6°F | Resp 20 | Ht 60.39 in | Wt 181.8 lb

## 2019-01-02 DIAGNOSIS — I10 Essential (primary) hypertension: Secondary | ICD-10-CM | POA: Diagnosis not present

## 2019-01-02 DIAGNOSIS — G4700 Insomnia, unspecified: Secondary | ICD-10-CM

## 2019-01-02 MED ORDER — TRAZODONE HCL 50 MG PO TABS: 25.0000 mg | ORAL_TABLET | Freq: Every evening | ORAL | 3 refills | Status: DC | PRN

## 2019-01-02 NOTE — Patient Instructions (Addendum)
  Take bloodpressue in the early morning and late evening-goal 130/80 Trial of Trazodone at night  1/2 -1 for sleep F/u if no improvement for recheck   If you have lab work done today you will be contacted with your lab results within the next 2 weeks.  If you have not heard from [place] then please contact us. The fastest way to get your results is to register for My Chart.   IF you received an x-ray today, you will receive an invoice from [facility]. Please contact [facility] at [phone] with questions or concerns regarding your invoice.   IF you received labwork today, you will receive an invoice from [place]. Please contact [facility] at [phone] with questions or concerns regarding your invoice.   Our billing staff will not be able to assist you with questions regarding bills from these companies.  You will be contacted with the lab results as soon as they are available. The fastest way to get your results is to activate your My Chart account. Instructions are located on the last page of this paperwork. If you have not heard from [place] regarding the results in 2 weeks, please contact this office.

## 2019-01-02 NOTE — Progress Notes (Signed)
Acute Office Visit  Subjective:    Patient ID: Frances Hunter, female    DOB: 1951/01/31, 68 y.o.   MRN: [id number]  Chief Complaint  Patient presents with  . Insomnia    X 2 weeks    HPI Patient is in today for difficulty sleeping "getting comfortable"  Pt sleeps on her side and experiences joint pain.  Pt with massages in the past.  Pt tried otc equate sleep aid-drowsiness-no deep sleep/ pt took medication in the past for sleep.  Pt never taken meds for anxiety or depression. Daily vitamin. 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  Pt taking meds nightly. Pt with regular exercise-walk in the evenings  Past Medical History:  Diagnosis Date  . GERD (gastroesophageal reflux disease)   . Hypertension   . Osteopenia    low Frax score, jan 2019  . Seasonal allergies     Past Surgical History:  Procedure Laterality Date  . TUBAL LIGATION      Family History  Problem Relation Age of Onset  . Cancer Father 83       lymphoma  . Hypertension Father   . Diabetes Brother   . Hyperlipidemia Brother   . Hypertension Brother   . Diabetes Maternal Grandfather   . Cancer Paternal Grandfather   . Liver disease Mother 32       cyst  . Hyperlipidemia Sister   . Breast cancer Neg Hx     Social History   Socioeconomic History  . Marital status: Married    Spouse Hunter: Not on file  . Number of children: 3  . Years of education: Not on file  . Highest education level: Not on file  Occupational History  . Occupation: Retired  Scientific laboratory technician  . Financial resource strain: Not on file  . Food insecurity:    Worry: Not on file    Inability: Not on file  . Transportation needs:    Medical: Not on file    Non-medical: Not on file  Tobacco Use  . Smoking status: Never Smoker  . Smokeless tobacco: Never Used  Substance and Sexual Activity  . Alcohol use: Yes    Alcohol/week: 2.0 standard drinks    Types: 2 Glasses of wine per week  . Drug use: No  . Sexual  activity: Yes  Lifestyle  . Physical activity:    Days per week: Not on file    Minutes per session: Not on file  . Stress: Not on file  Relationships  . Social connections:    Talks on phone: Not on file    Gets together: Not on file    Attends religious service: Not on file    Active member of club or organization: Not on file    Attends meetings of clubs or organizations: Not on file    Relationship status: Not on file  . Intimate partner violence:    Fear of current or ex partner: Not on file    Emotionally abused: Not on file    Physically abused: Not on file    Forced sexual activity: Not on file  Other Topics Concern  . Not on file  Social History Narrative   Married. College: Yes. Exercise: Yes.    Outpatient Medications Prior to Visit  Medication Sig Dispense Refill  . fluticasone (FLONASE) 50 MCG/ACT nasal spray Place 2 sprays into both nostrils daily. 16 g 11  . lisinopril-hydrochlorothiazide (PRINZIDE,ZESTORETIC) 10-12.5 MG tablet TAKE 1 TABLET BY  MOUTH EVERY DAY 90 tablet 1  . omeprazole (PRILOSEC) 40 MG capsule Take 1 capsule (40 mg total) by mouth daily as needed. 60 capsule 1   No facility-administered medications prior to visit.     No Known Allergies  ROS CONSTITUTIONAL: no weight loss, fever EENT:no sinus problems CV: no chest pain RESP: no SOB, cough GI: no heartburn GU: no pain with urination MS: joint pain intermittently-usually gets massage-cancelled due to covid NEURO:no  headaches PSY: Insomnia     Objective:    Physical Exam  Constitutional: She is oriented to person, place, and time. She appears well-developed and well-nourished. No distress.  Cardiovascular: Normal rate and regular rhythm.  Pulmonary/Chest: Effort normal and breath sounds normal.  Neurological: She is alert and oriented to person, place, and time.  Psychiatric: She has a normal mood and affect. Her behavior is normal.    BP (!) 152/62   Pulse 91   Temp 98.6 F  (37 C) (Oral)   Resp 20   Ht 5' 0.39" (1.534 m)   Wt 181 lb 12.8 oz (82.5 kg)   SpO2 97%   BMI 35.04 kg/m  Wt Readings from Last 3 Encounters:  01/02/19 181 lb 12.8 oz (82.5 kg)  09/25/18 183 lb 8 oz (83.2 kg)  06/11/17 183 lb (83 kg)      Lab Results  Component Value Date   TSH 4.310 09/25/2018   Lab Results  Component Value Date   WBC 5.3 06/11/2017   HGB 13.1 06/11/2017   HCT 40.0 06/11/2017   MCV 92 06/11/2017   PLT 333 06/11/2017   Lab Results  Component Value Date   NA 145 (H) 09/25/2018   K 4.0 09/25/2018   CO2 24 09/25/2018   GLUCOSE 96 09/25/2018   BUN 18 09/25/2018   CREATININE 0.78 09/25/2018   BILITOT 0.2 09/25/2018   ALKPHOS 67 09/25/2018   AST 17 09/25/2018   ALT 20 09/25/2018   PROT 6.6 09/25/2018   ALBUMIN 3.8 09/25/2018   CALCIUM 9.1 09/25/2018   Lab Results  Component Value Date   CHOL 137 09/25/2018   Lab Results  Component Value Date   HDL 59 09/25/2018   Lab Results  Component Value Date   LDLCALC 71 09/25/2018   Lab Results  Component Value Date   TRIG 33 09/25/2018   Lab Results  Component Value Date   CHOLHDL 2.3 09/25/2018       Assessment & Plan:  1. Insomnia, unspecified type Trial of trazodone-rx-d/w pt risk/benefit-f/u if no improvement for discussion about a different med-consider amitriptyline. D/w pt sleep architecture  2. Essential hypertension Take bp at home early morning and late evening-goal 130/80 Recheck today 142/62-left arm  LISA Mat CarneLEIGH CORUM, MD

## 2019-01-12 ENCOUNTER — Telehealth: Payer: Self-pay | Admitting: Family Medicine

## 2019-01-12 NOTE — Telephone Encounter (Signed)
Please advise for Trazadone is not helping pt.   Pt is requesting something else

## 2019-01-12 NOTE — Telephone Encounter (Signed)
Pt saw lisa on 01-02-2019 for insomnia and the trazodone is not helping her sleep. Pt is still aware of her surroundings. [facility] [address]

## 2019-01-13 NOTE — Telephone Encounter (Signed)
Pt needs an appt to discuss medication for insomnia. Pt needs to bring BP readings to her appt

## 2019-01-15 NOTE — Telephone Encounter (Signed)
SCHEDULED

## 2019-01-17 ENCOUNTER — Encounter: Payer: Self-pay | Admitting: Family Medicine

## 2019-01-17 ENCOUNTER — Other Ambulatory Visit: Payer: Self-pay

## 2019-01-17 ENCOUNTER — Ambulatory Visit (INDEPENDENT_AMBULATORY_CARE_PROVIDER_SITE_OTHER): Payer: Medicare Other | Admitting: Family Medicine

## 2019-01-17 VITALS — BP 144/64 | HR 80 | Temp 98.2°F | Ht 62.8 in | Wt 181.8 lb

## 2019-01-17 DIAGNOSIS — G4709 Other insomnia: Secondary | ICD-10-CM | POA: Diagnosis not present

## 2019-01-17 NOTE — Patient Instructions (Addendum)
     If you have lab work done today you will be contacted with your lab results within the next 2 weeks.  If you have not heard from [place] then please contact us. The fastest way to get your results is to register for My Chart.  Melatonin -Take prior to bedtime-lowest dose to start-may increase as needed per package insert instructions. Continue to avoid caffeine and heavy meals at night. White noise may be helpful IF you received an x-ray today, you will receive an invoice from [facility]. Please contact [facility] at [phone] with questions or concerns regarding your invoice.   IF you received labwork today, you will receive an invoice from [facility]. Please contact [facility] at [phone] with questions or concerns regarding your invoice.   Our billing staff will not be able to assist you with questions regarding bills from these companies.  You will be contacted with the lab results as soon as they are available. The fastest way to get your results is to activate your My Chart account. Instructions are located on the last page of this paperwork. If you have not heard from [place] regarding the results in 2 weeks, please contact this office.

## 2019-01-17 NOTE — Progress Notes (Signed)
Acute Office Visit  Subjective:    Patient ID: Frances Hunter, female    DOB: 11/08/1950, 68 y.o.   MRN: [id number]  Chief Complaint  Patient presents with  . Insomnia    X 3 weeks    HPI Patient is in today for  Insomnia-pt took trazodone 25-50mg  -no improvement in sleep and drowsiness the following day.  Pt had otc sleepy meds from [facility]-pt states did not help(antihistamine). Pt with difficulty going to sleep-not a concern with staying asleep.  Pt states getting to sleep more difficulty over the last few weeks although lifelong "light sleeper"  Past Medical History:  Diagnosis Date  . GERD (gastroesophageal reflux disease)   . Hypertension   . Osteopenia    low Frax score, jan 2019  . Seasonal allergies     Past Surgical History:  Procedure Laterality Date  . TUBAL LIGATION      Family History  Problem Relation Age of Onset  . Cancer Father [id number]       lymphoma  . Hypertension Father   . Diabetes Brother   . Hyperlipidemia Brother   . Hypertension Brother   . Diabetes Maternal Grandfather   . Cancer Paternal Grandfather   . Liver disease Mother [id number]       cyst  . Hyperlipidemia Sister   . Breast cancer Neg Hx     Social History   Socioeconomic History  . Marital status: Married    Spouse name: Not on file  . Number of children: 3  . Years of education: Not on file  . Highest education level: Not on file  Occupational History  . Occupation: Retired  Engineer, productionocial Needs  . Financial resource strain: Not on file  . Food insecurity    Worry: Not on file    Inability: Not on file  . Transportation needs    Medical: Not on file    Non-medical: Not on file  Tobacco Use  . Smoking status: Never Smoker  . Smokeless tobacco: Never Used  Substance and Sexual Activity  . Alcohol use: Yes    Alcohol/week: 2.0 standard drinks    Types: 2 Glasses of wine per week  . Drug use: No  . Sexual activity: Yes  Lifestyle  . Physical activity    Days per week: Not on  file    Minutes per session: Not on file  . Stress: Not on file  Relationships  . Social Musicianconnections    Talks on phone: Not on file    Gets together: Not on file    Attends religious service: Not on file    Active member of club or organization: Not on file    Attends meetings of clubs or organizations: Not on file    Relationship status: Not on file  . Intimate partner violence    Fear of current or ex partner: Not on file    Emotionally abused: Not on file    Physically abused: Not on file    Forced sexual activity: Not on file  Other Topics Concern  . Not on file  Social History Narrative   Married. College: Yes. Exercise: Yes.    Outpatient Medications Prior to Visit  Medication Sig Dispense Refill  . fluticasone (FLONASE) 50 MCG/ACT nasal spray Place 2 sprays into both nostrils daily. 16 g 11  . lisinopril-hydrochlorothiazide (PRINZIDE,ZESTORETIC) 10-12.5 MG tablet TAKE 1 TABLET BY MOUTH EVERY DAY 90 tablet 1  . omeprazole (PRILOSEC) 40 MG capsule Take 1 capsule (  40 mg total) by mouth daily as needed. 60 capsule 1  . traZODone (DESYREL) 50 MG tablet Take 0.5-1 tablets (25-50 mg total) by mouth at bedtime as needed for sleep. 30 tablet 3   No facility-administered medications prior to visit.     No Known Allergies  Review of Systems  Psychiatric/Behavioral: Negative for depression. The patient has insomnia. The patient is not nervous/anxious.        Objective:    Physical Exam  Constitutional: She appears well-developed and well-nourished.  Psychiatric: She has a normal mood and affect.    BP (!) 144/64 (BP Location: Left Arm, Patient Position: Sitting, Cuff Size: Large)   Pulse 80   Temp 98.2 F (36.8 C) (Oral)   Ht 5' 2.8" (1.595 m)   Wt 181 lb 12.8 oz (82.5 kg)   SpO2 96%   BMI 32.42 kg/m  Wt Readings from Last 3 Encounters:  01/17/19 181 lb 12.8 oz (82.5 kg)  01/02/19 181 lb 12.8 oz (82.5 kg)  09/25/18 183 lb 8 oz (83.2 kg)     Lab Results   Component Value Date   TSH 4.310 09/25/2018   Lab Results  Component Value Date   WBC 5.3 06/11/2017   HGB 13.1 06/11/2017   HCT 40.0 06/11/2017   MCV 92 06/11/2017   PLT 333 06/11/2017   Lab Results  Component Value Date   NA 145 (H) 09/25/2018   K 4.0 09/25/2018   CO2 24 09/25/2018   GLUCOSE 96 09/25/2018   BUN 18 09/25/2018   CREATININE 0.78 09/25/2018   BILITOT 0.2 09/25/2018   ALKPHOS 67 09/25/2018   AST 17 09/25/2018   ALT 20 09/25/2018   PROT 6.6 09/25/2018   ALBUMIN 3.8 09/25/2018   CALCIUM 9.1 09/25/2018   Lab Results  Component Value Date   CHOL 137 09/25/2018   Lab Results  Component Value Date   HDL 59 09/25/2018   Lab Results  Component Value Date   LDLCALC 71 09/25/2018   Lab Results  Component Value Date   TRIG 33 09/25/2018   Lab Results  Component Value Date   CHOLHDL 2.3 09/25/2018       Assessment & Plan:   Problem List Items Addressed This Visit    None     1. Other insomnia Pt to try melatonin otc, use white noise to avoid waking, avoid caffeine and other stimulants. Pt to notify when she is ready for trial of sleep medication-belsomra suggested. Pt understands she will need to sign an agreement for medication since controlled if she would like to try at low dose. Pt states she will try melatonin first. Greater than 50% of appt discussion on insomnia Dellanira Dillow Hannah Beat, MD

## 2019-01-25 NOTE — Progress Notes (Signed)
This encounter was created in error - please disregard.

## 2019-01-31 ENCOUNTER — Telehealth: Payer: Self-pay | Admitting: Family Medicine

## 2019-01-31 NOTE — Telephone Encounter (Signed)
Patient was seen on 6/24 for insomnia and was advised to contact office if she felt she needed an RX to help with symptoms. Patient states she does not feel melatonin is working well for her.Patient inquired if something can be sent into pharmacy. Please advise.

## 2019-02-01 NOTE — Telephone Encounter (Signed)
Patient called to check status of getting prescribed a new medication due to her melatonin not working.  Patient states that PCP would just call her in something for her insomnia  Call back [phone]  [id number] [place], [place] [phone] (Phone) [phone] (Fax)

## 2019-02-02 NOTE — Telephone Encounter (Signed)
Patient calling back about medication status.

## 2019-02-02 NOTE — Telephone Encounter (Signed)
Please advise 

## 2019-02-02 NOTE — Telephone Encounter (Signed)
Pt needs to sign a controlled substance agreement since sleeping medication is a controlled substance. Please have her make an appointment with me next week.  Since this requires a witness for documentation , she would need to be seen in the office.  Unfortunately, we can not upload this paperwork to mychart or complete the initial visit virtual for a controlled substance.  She may want to continue melatonin until she is seen as sometimes this takes several weeks to be effective.

## 2019-02-04 IMAGING — MG 2D DIGITAL SCREENING BILATERAL MAMMOGRAM WITH 3D TOMO WITH CAD
8 of 12 series · 8 of 28 positions shown · non-contrast
Comparison: Previous exam(s).

CLINICAL DATA: Screening.

EXAM:
2D DIGITAL SCREENING BILATERAL MAMMOGRAM WITH 3D TOMO WITH CAD

[L MLO]
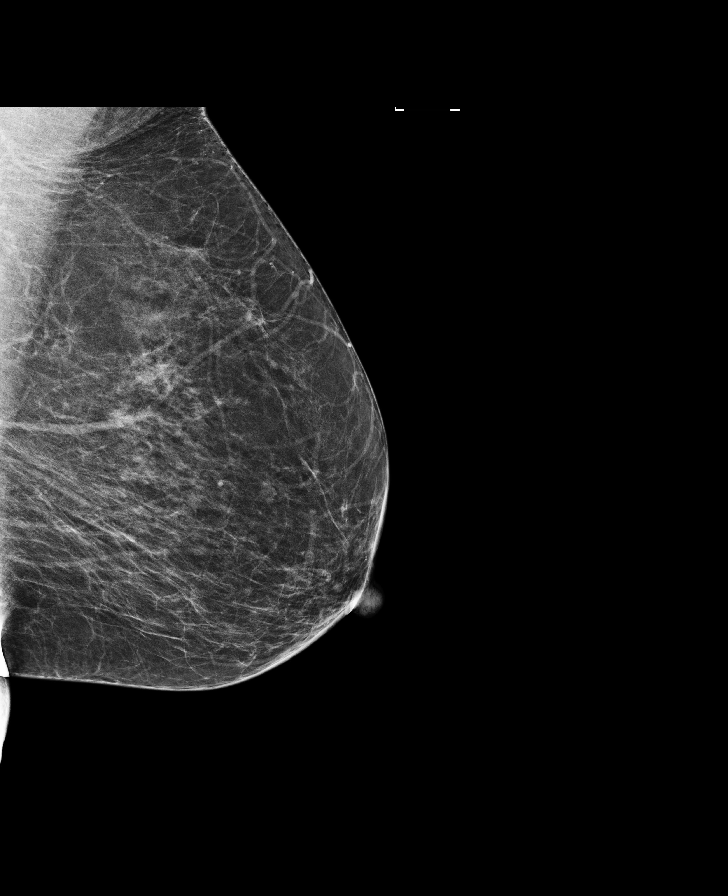

[R MLO]
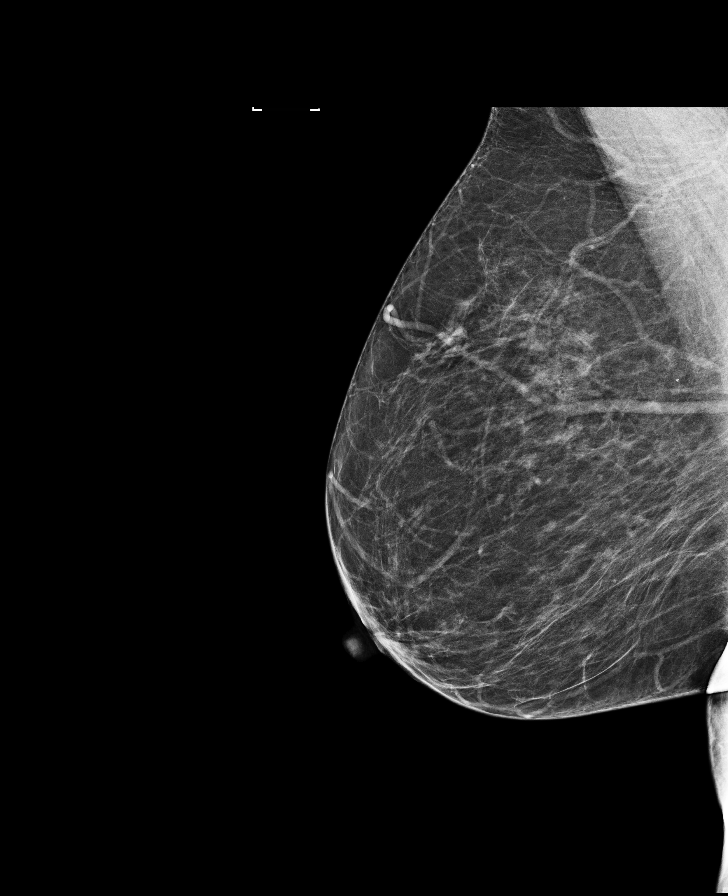

[L CC]
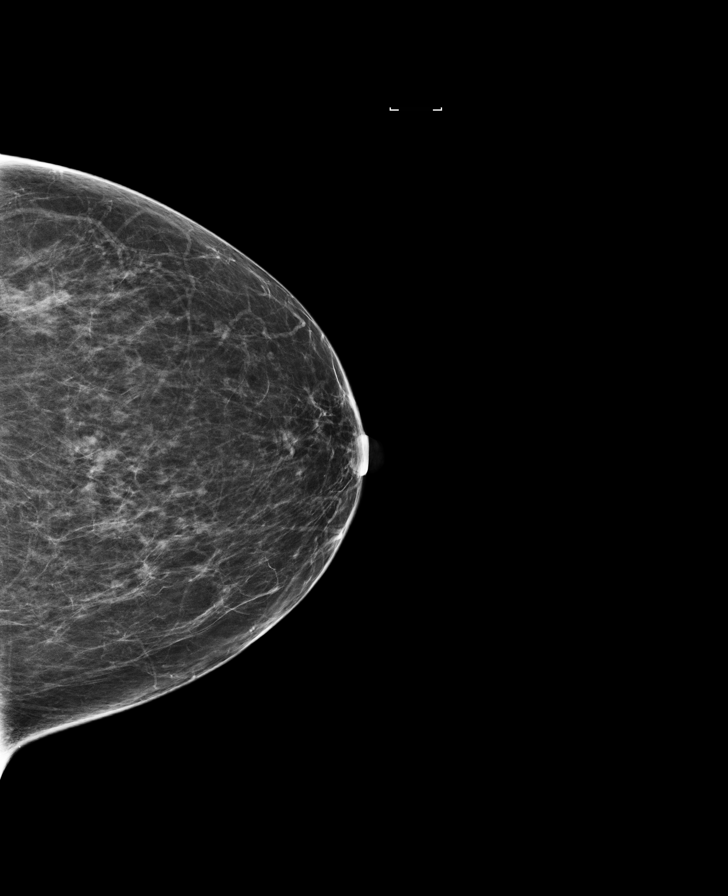

[L CC synth-2D]
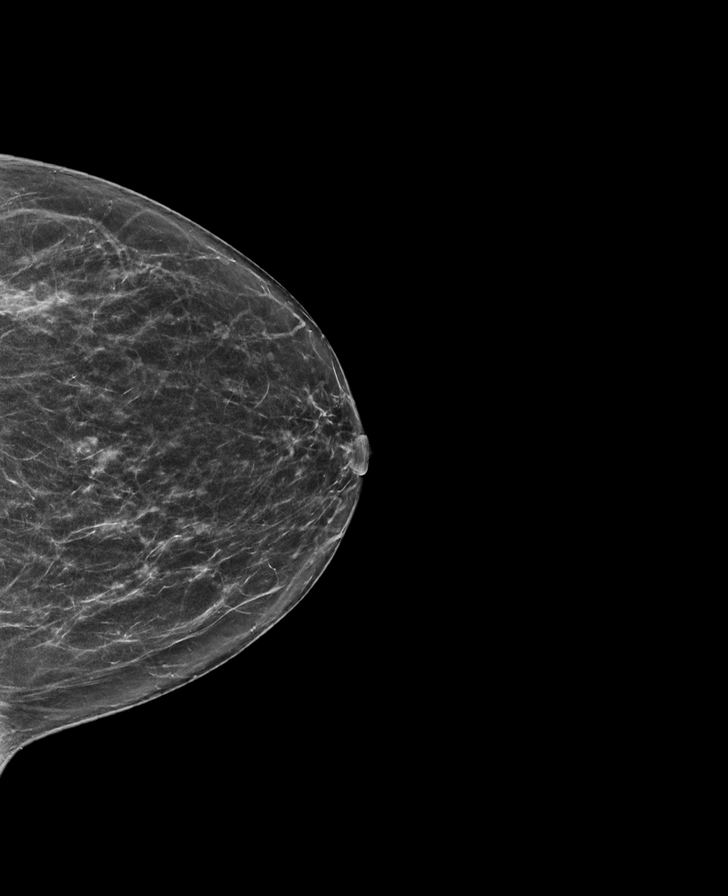

[R MLO synth-2D]
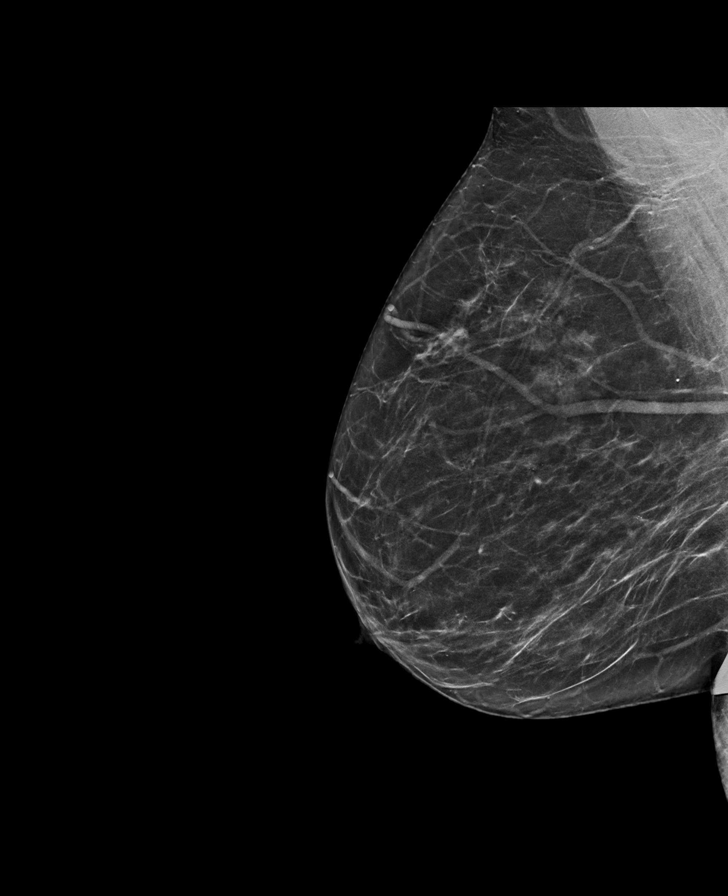

[R CC synth-2D]
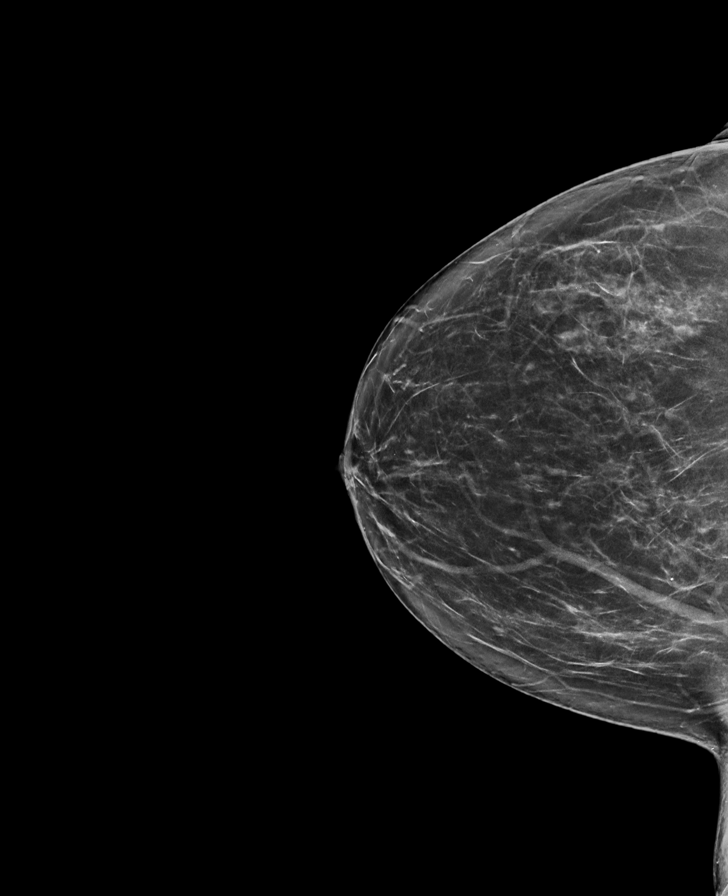

[R CC]
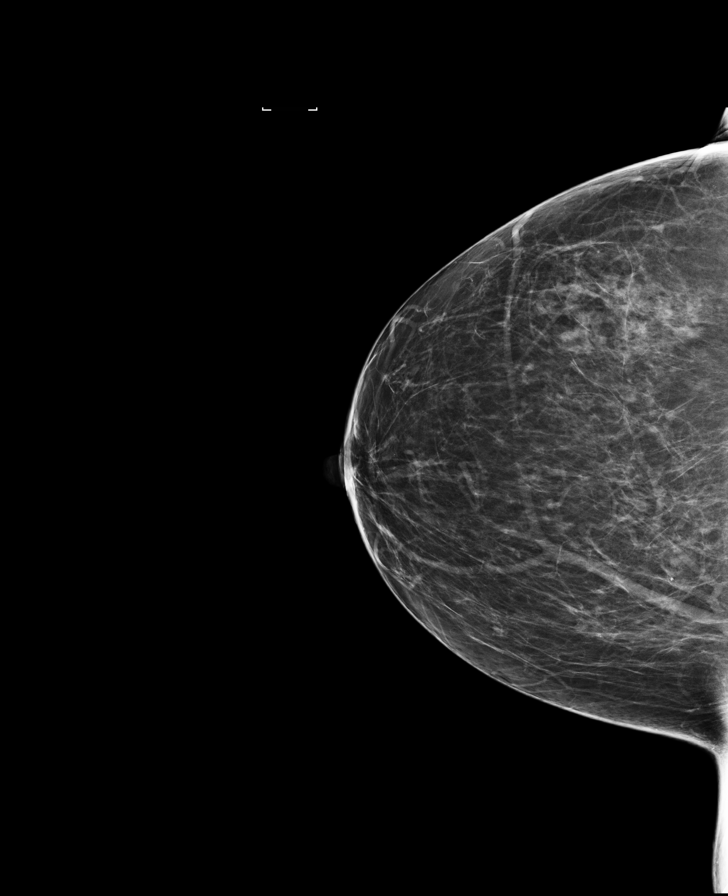

[L MLO synth-2D]
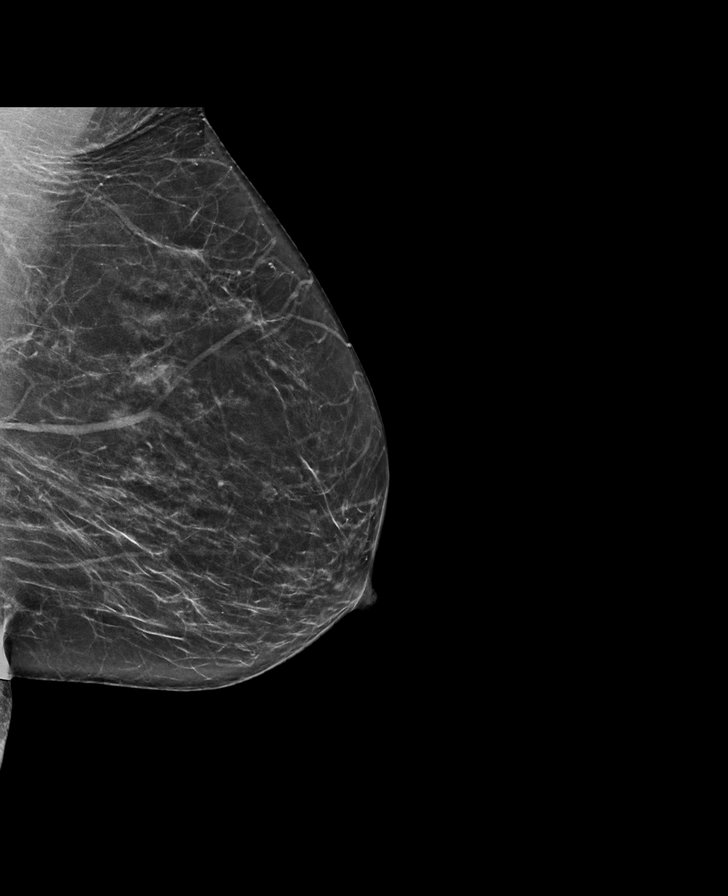

[8 of 28 positions shown; findings below may reference images not displayed]

ACR Breast Density Category b: There are scattered areas of
fibroglandular density.
FINDINGS: There are no findings suspicious for malignancy. Images were
processed with CAD.
IMPRESSION: No mammographic evidence of malignancy. A result letter of this
screening mammogram will be mailed directly to the patient.

RECOMMENDATION:
Screening mammogram in one year. (Code:[id number])

BI-RADS CATEGORY  1: Negative.

## 2019-02-06 ENCOUNTER — Other Ambulatory Visit: Payer: Self-pay

## 2019-02-06 ENCOUNTER — Ambulatory Visit (INDEPENDENT_AMBULATORY_CARE_PROVIDER_SITE_OTHER): Payer: Medicare Other | Admitting: Family Medicine

## 2019-02-06 ENCOUNTER — Encounter: Payer: Self-pay | Admitting: Family Medicine

## 2019-02-06 ENCOUNTER — Other Ambulatory Visit: Payer: Self-pay | Admitting: Family Medicine

## 2019-02-06 VITALS — BP 149/77 | HR 85 | Temp 98.7°F | Ht 63.0 in | Wt 181.2 lb

## 2019-02-06 DIAGNOSIS — G4709 Other insomnia: Secondary | ICD-10-CM

## 2019-02-06 MED ORDER — BELSOMRA 5 MG PO TABS: 5.0000 mg | ORAL_TABLET | Freq: Every day | ORAL | 1 refills | Status: DC

## 2019-02-06 NOTE — Progress Notes (Signed)
Established Patient Office Visit  Subjective:  Patient ID: Frances Hunter, female    DOB: 1950-08-28  Age: 68 y.o. MRN: [id number]  CC:  Chief Complaint  Patient presents with  . Insomnia    over a month (sign for control paperwork)    HPI Frances Hunter presents for insomnia-pt has tried benadryl, trazodone, melatonin and continues to have difficulty falling asleep. Once asleep pt stays asleep. Pt with difficulty several years ago and took Ambien for a few weeks then has not needed meds since that time period.  Past Medical History:  Diagnosis Date  . GERD (gastroesophageal reflux disease)   . Hypertension   . Osteopenia    low Frax score, jan 2019  . Seasonal allergies     Past Surgical History:  Procedure Laterality Date  . TUBAL LIGATION      Family History  Problem Relation Age of Onset  . Cancer Father 54       lymphoma  . Hypertension Father   . Diabetes Brother   . Hyperlipidemia Brother   . Hypertension Brother   . Diabetes Maternal Grandfather   . Cancer Paternal Grandfather   . Liver disease Mother 98       cyst  . Hyperlipidemia Sister   . Breast cancer Neg Hx     Social History   Socioeconomic History  . Marital status: Married    Spouse name: Not on file  . Number of children: 3  . Years of education: Not on file  . Highest education level: Not on file  Occupational History  . Occupation: Retired  Scientific laboratory technician  . Financial resource strain: Not on file  . Food insecurity    Worry: Not on file    Inability: Not on file  . Transportation needs    Medical: Not on file    Non-medical: Not on file  Tobacco Use  . Smoking status: Never Smoker  . Smokeless tobacco: Never Used  Substance and Sexual Activity  . Alcohol use: Yes    Alcohol/week: 2.0 standard drinks    Types: 2 Glasses of wine per week  . Drug use: No  . Sexual activity: Yes  Lifestyle  . Physical activity    Days per week: Not on file    Minutes per session: Not  on file  . Stress: Not on file  Relationships  . Social Herbalist on phone: Not on file    Gets together: Not on file    Attends religious service: Not on file    Active member of club or organization: Not on file    Attends meetings of clubs or organizations: Not on file    Relationship status: Not on file  . Intimate partner violence    Fear of current or ex partner: Not on file    Emotionally abused: Not on file    Physically abused: Not on file    Forced sexual activity: Not on file  Other Topics Concern  . Not on file  Social History Narrative   Married. College: Yes. Exercise: Yes.    Outpatient Medications Prior to Visit  Medication Sig Dispense Refill  . fluticasone (FLONASE) 50 MCG/ACT nasal spray Place 2 sprays into both nostrils daily. 16 g 11  . lisinopril-hydrochlorothiazide (PRINZIDE,ZESTORETIC) 10-12.5 MG tablet TAKE 1 TABLET BY MOUTH EVERY DAY 90 tablet 1  . omeprazole (PRILOSEC) 40 MG capsule Take 1 capsule (40 mg total) by mouth daily as needed. [place]  capsule 1  . traZODone (DESYREL) 50 MG tablet Take 0.5-1 tablets (25-50 mg total) by mouth at bedtime as needed for sleep. (Patient not taking: Reported on 02/06/2019) 30 tablet 3   No facility-administered medications prior to visit.     No Known Allergies  ROS Review of Systems  Psychiatric/Behavioral: Positive for sleep disturbance. The patient is not nervous/anxious.   No recent stressors    Objective:    Physical Exam  Constitutional: She appears well-developed and well-nourished. No distress.  Cardiovascular: Normal heart sounds.  greater than 50% of time used in counseling for pt about insomnia, medication risk/benefit/side effects BP (!) 149/77 (BP Location: Right Arm, Patient Position: Sitting, Cuff Size: Large)   Pulse 85   Temp 98.7 F (37.1 C) (Oral)   Ht 5\' 3"  (1.6 m)   Wt 181 lb 3.2 oz (82.2 kg)   SpO2 96%   BMI 32.10 kg/m  Wt Readings from Last 3 Encounters:  02/06/19 181 lb  3.2 oz (82.2 kg)  01/17/19 181 lb 12.8 oz (82.5 kg)  01/02/19 181 lb 12.8 oz (82.5 kg)    Lab Results  Component Value Date   TSH 4.310 09/25/2018   Lab Results  Component Value Date   WBC 5.3 06/11/2017   HGB 13.1 06/11/2017   HCT 40.0 06/11/2017   MCV 92 06/11/2017   PLT 333 06/11/2017   Lab Results  Component Value Date   NA 145 (H) 09/25/2018   K 4.0 09/25/2018   CO2 24 09/25/2018   GLUCOSE 96 09/25/2018   BUN 18 09/25/2018   CREATININE 0.78 09/25/2018   BILITOT 0.2 09/25/2018   ALKPHOS 67 09/25/2018   AST 17 09/25/2018   ALT 20 09/25/2018   PROT 6.6 09/25/2018   ALBUMIN 3.8 09/25/2018   CALCIUM 9.1 09/25/2018   Lab Results  Component Value Date   CHOL 137 09/25/2018   Lab Results  Component Value Date   HDL 59 09/25/2018   Lab Results  Component Value Date   LDLCALC 71 09/25/2018   Lab Results  Component Value Date   TRIG 33 09/25/2018   Lab Results  Component Value Date   CHOLHDL 2.3 09/25/2018      1. Other insomnia Trial of belsomra-risk/benefit/side effects d/w pt -d/w pt if unsuccessful with this medication in inducing sleep-referral for sleep evaluation . Controlled substance paperwork d/w pt-signed Assessment & Plan:  Frances Reiger Mat CarneLEIGH Carleen Rhue, MD

## 2019-02-06 NOTE — Patient Instructions (Signed)
° ° ° °  If you have lab work done today you will be contacted with your lab results within the next 2 weeks.  If you have not heard from us then please contact us. The fastest way to get your results is to register for My Chart. ° ° °IF you received an x-ray today, you will receive an invoice from [facility]. Please contact [facility] at [phone] with questions or concerns regarding your invoice.  ° °IF you received labwork today, you will receive an invoice from [facility]. Please contact [facility] at [phone] with questions or concerns regarding your invoice.  ° °Our billing staff will not be able to assist you with questions regarding bills from these companies. ° °You will be contacted with the lab results as soon as they are available. The fastest way to get your results is to activate your My Chart account. Instructions are located on the last page of this paperwork. If you have not heard from us regarding the results in 2 weeks, please contact this office. °  ° ° ° °

## 2019-03-27 ENCOUNTER — Other Ambulatory Visit: Payer: Self-pay

## 2019-03-27 ENCOUNTER — Encounter: Payer: Self-pay | Admitting: Family Medicine

## 2019-03-27 ENCOUNTER — Ambulatory Visit (INDEPENDENT_AMBULATORY_CARE_PROVIDER_SITE_OTHER): Payer: Medicare Other | Admitting: Family Medicine

## 2019-03-27 VITALS — BP 120/62 | HR 80 | Temp 98.2°F | Ht 60.0 in | Wt 181.0 lb

## 2019-03-27 VITALS — BP 120/62 | HR 80 | Temp 98.2°F | Resp 16 | Ht 60.0 in | Wt 181.4 lb

## 2019-03-27 DIAGNOSIS — G4709 Other insomnia: Secondary | ICD-10-CM | POA: Diagnosis not present

## 2019-03-27 DIAGNOSIS — K219 Gastro-esophageal reflux disease without esophagitis: Secondary | ICD-10-CM | POA: Diagnosis not present

## 2019-03-27 DIAGNOSIS — M8580 Other specified disorders of bone density and structure, unspecified site: Secondary | ICD-10-CM

## 2019-03-27 DIAGNOSIS — I10 Essential (primary) hypertension: Secondary | ICD-10-CM | POA: Diagnosis not present

## 2019-03-27 DIAGNOSIS — Z23 Encounter for immunization: Secondary | ICD-10-CM

## 2019-03-27 DIAGNOSIS — Z0000 Encounter for general adult medical examination without abnormal findings: Secondary | ICD-10-CM

## 2019-03-27 NOTE — Progress Notes (Signed)
9/1/20209:12 AM  Frances Hunter 05/25/1951, 68 y.o., female [id number]  Chief Complaint  Patient presents with  . Follow-up    says she sleeps ok, just not getting to REM sleep yyet feels rested in the morning. Wants to increase the sleep med to 10mg  and talk about any side effects that may cause    HPI:   Patient is a 68 y.o. female with past medical history significant for HTN, osteopenia, GERD, varicose veins, seasonal allergies who presents today for routine followup  Last OV with me March 2020 - no changes Saw Dr Judee Claraorum June and July 2020 - started on belsomra for insomnia - not working, stopped taking after a week, she reports having a light sleep that is restful, she is waking up energized, is not needing to take naps  Has not been exercising as often since covid She is working on her diet She would like to lose weight Continues to take vit D and calcium  Heartburn doing ok, takes ppi as needed Has been taking dried fruit with probiotics - really helping  Taking BP med as prescribed, denies any side effects She checks BP at home occasionally, has been ok  Does not need med refills  Had flu vaccine this morning  dexa done 2019 mammo done 2020 cologuard done 2019  [facility] 03/27/2019 02/06/2019 01/17/2019  Decreased Interest 0 0 0  Down, Depressed, Hopeless 0 0 0  PHQ - 2 Score 0 0 0    Fall Risk  03/27/2019 02/06/2019 01/17/2019 01/02/2019 11/03/2018  Falls in the past year? 0 0 0 0 0  Number falls in past yr: 0 0 0 0 0  Injury with Fall? 0 0 0 0 0  Follow up Falls evaluation completed;Education provided;Falls prevention discussed Falls evaluation completed Falls evaluation completed Falls evaluation completed -     No Known Allergies  Prior to Admission medications   Medication Sig Start Date End Date Taking? Authorizing Provider  fluticasone (FLONASE) 50 MCG/ACT nasal spray Place 2 sprays into both nostrils daily. 09/25/18   Myles LippsSantiago, Jerita Wimbush M, MD   lisinopril-hydrochlorothiazide (PRINZIDE,ZESTORETIC) 10-12.5 MG tablet TAKE 1 TABLET BY MOUTH EVERY DAY 09/25/18   Myles LippsSantiago, Serine Kea M, MD  omeprazole (PRILOSEC) 40 MG capsule Take 1 capsule (40 mg total) by mouth daily as needed. 09/25/18   Myles LippsSantiago, Ladell Lea M, MD  Suvorexant (BELSOMRA) 5 MG TABS Take 5 mg by mouth at bedtime. Patient not taking: Reported on 03/27/2019 02/06/19   Wandra Feinsteinorum, Lisa L, MD    Past Medical History:  Diagnosis Date  . GERD (gastroesophageal reflux disease)   . Hypertension   . Osteopenia    low Frax score, jan 2019  . Seasonal allergies     Past Surgical History:  Procedure Laterality Date  . TUBAL LIGATION      Social History   Tobacco Use  . Smoking status: Never Smoker  . Smokeless tobacco: Never Used  Substance Use Topics  . Alcohol use: Yes    Alcohol/week: 2.0 standard drinks    Types: 2 Glasses of wine per week    Family History  Problem Relation Age of Onset  . Cancer Father [id number]       lymphoma  . Hypertension Father   . Diabetes Brother   . Hyperlipidemia Brother   . Hypertension Brother   . Diabetes Maternal Grandfather   . Cancer Paternal Grandfather   . Liver disease Mother [id number]       cyst  .  Hyperlipidemia Sister   . Breast cancer Neg Hx     Review of Systems  Constitutional: Negative for chills and fever.  Respiratory: Negative for cough and shortness of breath.   Cardiovascular: Negative for chest pain, palpitations and leg swelling.  Gastrointestinal: Negative for abdominal pain, nausea and vomiting.     OBJECTIVE:  Today's Vitals   03/27/19 0932  BP: 120/62  Pulse: 80  Temp: 98.2 F (36.8 C)  SpO2: 98%  Weight: 181 lb (82.1 kg)  Height: 5' (1.524 m)   Body mass index is 35.35 kg/m.   Wt Readings from Last 3 Encounters:  03/27/19 181 lb (82.1 kg)  03/27/19 181 lb 6.4 oz (82.3 kg)  02/06/19 181 lb 3.2 oz (82.2 kg)     Physical Exam Vitals signs and nursing note reviewed.  Constitutional:      Appearance: She is  well-developed.  HENT:     Head: Normocephalic and atraumatic.     Mouth/Throat:     Pharynx: No oropharyngeal exudate.  Eyes:     General: No scleral icterus.    Conjunctiva/sclera: Conjunctivae normal.     Pupils: Pupils are equal, round, and reactive to light.  Neck:     Musculoskeletal: Neck supple.  Cardiovascular:     Rate and Rhythm: Normal rate and regular rhythm.     Heart sounds: Normal heart sounds. No murmur. No friction rub. No gallop.   Pulmonary:     Effort: Pulmonary effort is normal.     Breath sounds: Normal breath sounds. No wheezing or rales.  Skin:    General: Skin is warm and dry.  Neurological:     Mental Status: She is alert and oriented to person, place, and time.     No results found for this or any previous visit (from the past 24 hour(s)).  No results found.   ASSESSMENT and PLAN  1. Essential hypertension Controlled. Continue current regime.   2. Osteopenia, unspecified location Stable. Continue current regime. dexa due 2021  3. Gastroesophageal reflux disease without esophagitis Controlled. Continue current regime.   4. Other insomnia Resolved. No medications needed  Return in about 6 months (around 09/24/2019).    Rutherford Guys, MD [facility] [place], [place] [address] Ph.  [phone] Fax [phone]

## 2019-03-27 NOTE — Patient Instructions (Signed)
Thank you for taking time to come for your Medicare Wellness Visit. I appreciate your ongoing commitment to your health goals. Please review the following plan we discussed and let me know if I can assist you in the future.  Naeemah Jasmer LPN  Preventive Care 65 Years and Older, Female Preventive care refers to lifestyle choices and visits with your health care provider that can promote health and wellness. This includes:  A yearly physical exam. This is also called an annual well check.  Regular dental and eye exams.  Immunizations.  Screening for certain conditions.  Healthy lifestyle choices, such as diet and exercise. What can I expect for my preventive care visit? Physical exam Your health care provider will check:  Height and weight. These may be used to calculate body mass index (BMI), which is a measurement that tells if you are at a healthy weight.  Heart rate and blood pressure.  Your skin for abnormal spots. Counseling Your health care provider may ask you questions about:  Alcohol, tobacco, and drug use.  Emotional well-being.  Home and relationship well-being.  Sexual activity.  Eating habits.  History of falls.  Memory and ability to understand (cognition).  Work and work environment.  Pregnancy and menstrual history. What immunizations do I need?  Influenza (flu) vaccine  This is recommended every year. Tetanus, diphtheria, and pertussis (Tdap) vaccine  You may need a Td booster every 10 years. Varicella (chickenpox) vaccine  You may need this vaccine if you have not already been vaccinated. Zoster (shingles) vaccine  You may need this after age 60. Pneumococcal conjugate (PCV13) vaccine  One dose is recommended after age 65. Pneumococcal polysaccharide (PPSV23) vaccine  One dose is recommended after age 65. Measles, mumps, and rubella (MMR) vaccine  You may need at least one dose of MMR if you were born in 1957 or later. You may also  need a second dose. Meningococcal conjugate (MenACWY) vaccine  You may need this if you have certain conditions. Hepatitis A vaccine  You may need this if you have certain conditions or if you travel or work in places where you may be exposed to hepatitis A. Hepatitis B vaccine  You may need this if you have certain conditions or if you travel or work in places where you may be exposed to hepatitis B. Haemophilus influenzae type b (Hib) vaccine  You may need this if you have certain conditions. You may receive vaccines as individual doses or as more than one vaccine together in one shot (combination vaccines). Talk with your health care provider about the risks and benefits of combination vaccines. What tests do I need? Blood tests  Lipid and cholesterol levels. These may be checked every 5 years, or more frequently depending on your overall health.  Hepatitis C test.  Hepatitis B test. Screening  Lung cancer screening. You may have this screening every year starting at age 55 if you have a 30-pack-year history of smoking and currently smoke or have quit within the past 15 years.  Colorectal cancer screening. All adults should have this screening starting at age 50 and continuing until age 75. Your health care provider may recommend screening at age 45 if you are at increased risk. You will have tests every 1-10 years, depending on your results and the type of screening test.  Diabetes screening. This is done by checking your blood sugar (glucose) after you have not eaten for a while (fasting). You may have this done every 1-3   years.  Mammogram. This may be done every 1-2 years. Talk with your health care provider about how often you should have regular mammograms.  BRCA-related cancer screening. This may be done if you have a family history of breast, ovarian, tubal, or peritoneal cancers. Other tests  Sexually transmitted disease (STD) testing.  Bone density scan. This is done  to screen for osteoporosis. You may have this done starting at age 6. Follow these instructions at home: Eating and drinking  Eat a diet that includes fresh fruits and vegetables, whole grains, lean protein, and low-fat dairy products. Limit your intake of foods with high amounts of sugar, saturated fats, and salt.  Take vitamin and mineral supplements as recommended by your health care provider.  Do not drink alcohol if your health care provider tells you not to drink.  If you drink alcohol: ? Limit how much you have to 0-1 drink a day. ? Be aware of how much alcohol is in your drink. In the [place] one drink equals one 12 oz bottle of beer (355 mL), one 5 oz glass of wine (148 mL), or one 1 oz glass of hard liquor (44 mL). Lifestyle  Take daily care of your teeth and gums.  Stay active. Exercise for at least 30 minutes on 5 or more days each week.  Do not use any products that contain nicotine or tobacco, such as cigarettes, e-cigarettes, and chewing tobacco. If you need help quitting, ask your health care provider.  If you are sexually active, practice safe sex. Use a condom or other form of protection in order to prevent STIs (sexually transmitted infections).  Talk with your health care provider about taking a low-dose aspirin or statin. What's next?  Go to your health care provider once a year for a well check visit.  Ask your health care provider how often you should have your eyes and teeth checked.  Stay up to date on all vaccines. This information is not intended to replace advice given to you by your health care provider. Make sure you discuss any questions you have with your health care provider. Document Released: 08/08/2015 Document Revised: 07/06/2018 Document Reviewed: 07/06/2018 Elsevier Patient Education  2020 [employer].

## 2019-03-27 NOTE — Progress Notes (Signed)
Presents today for [employer] Annual [facility]ness Visit   Date of last exam: 02/06/2019  Interpreter used for this visit?  No  Patient was seen clinic   Patient Care Team: Myles LippsSantiago, Irma M, MD as PCP - General (Family Medicine)  Discussed Immunization Discussed eye/dental     Other Screening:  Last lipid screening: 09/25/2018  ADVANCE DIRECTIVES: Discussed: yes On File:  no Materials Provided: yes  Immunization status:  Immunization History  Administered Date(s) Administered  . Influenza, High Dose Seasonal PF 09/25/2018  . Influenza,inj,Quad PF,6+ Mos 05/17/2014, 05/22/2015, 05/27/2016, 06/11/2017  . Pneumococcal Conjugate-13 05/27/2016  . Pneumococcal Polysaccharide-23 02/23/2010, 06/11/2017  . Tdap 02/24/2007, 05/22/2015     Health Maintenance Due  Topic Date Due  . INFLUENZA VACCINE  02/24/2019     Functional Status Survey: Is the patient deaf or have difficulty hearing?: No Does the patient have difficulty seeing, even when wearing glasses/contacts?: No Does the patient have difficulty concentrating, remembering, or making decisions?: No Does the patient have difficulty walking or climbing stairs?: No Does the patient have difficulty dressing or bathing?: No Does the patient have difficulty doing errands alone such as visiting a doctor's office or shopping?: No   6CIT Screen 03/27/2019  What Year? 0 points  What month? 0 points  What time? 0 points  Count back from 20 0 points  Months in reverse 0 points  Repeat phrase 0 points  Total Score 0        Clinical Support from 03/27/2019 in [facility]  AUDIT-C Score  1       Home Environment:  Lives in one story with husband   No scattered rugs No grab bars Adequate lighting/free clutter  No trouble climbing stairs Can get up in 30 seconds   Patient Active Problem List   Diagnosis Date Noted  . Insomnia 01/02/2019  . Seasonal allergies   . Osteopenia   . Osteopenia of thigh  07/07/2015  . Chronic venous insufficiency 07/12/2014  . Essential hypertension 12/10/2012  . Allergic rhinitis 12/10/2012  . GERD (gastroesophageal reflux disease) 12/10/2012  . Varicose veins of bilateral lower extremities with pain 08/24/2008     Past Medical History:  Diagnosis Date  . GERD (gastroesophageal reflux disease)   . Hypertension   . Osteopenia    low Frax score, jan 2019  . Seasonal allergies      Past Surgical History:  Procedure Laterality Date  . TUBAL LIGATION       Family History  Problem Relation Age of Onset  . Cancer Father [id number]       lymphoma  . Hypertension Father   . Diabetes Brother   . Hyperlipidemia Brother   . Hypertension Brother   . Diabetes Maternal Grandfather   . Cancer Paternal Grandfather   . Liver disease Mother [id number]       cyst  . Hyperlipidemia Sister   . Breast cancer Neg Hx      Social History   Socioeconomic History  . Marital status: Married    Spouse name: Not on file  . Number of children: 3  . Years of education: Not on file  . Highest education level: Not on file  Occupational History  . Occupation: Retired  Engineer, productionocial Needs  . Financial resource strain: Not on file  . Food insecurity    Worry: Not on file    Inability: Not on file  . Transportation needs    Medical: Not on file  Non-medical: Not on file  Tobacco Use  . Smoking status: Never Smoker  . Smokeless tobacco: Never Used  Substance and Sexual Activity  . Alcohol use: Yes    Alcohol/week: 2.0 standard drinks    Types: 2 Glasses of wine per week  . Drug use: No  . Sexual activity: Yes  Lifestyle  . Physical activity    Days per week: Not on file    Minutes per session: Not on file  . Stress: Not on file  Relationships  . Social Herbalist on phone: Not on file    Gets together: Not on file    Attends religious service: Not on file    Active member of club or organization: Not on file    Attends meetings of clubs or  organizations: Not on file    Relationship status: Not on file  . Intimate partner violence    Fear of current or ex partner: Not on file    Emotionally abused: Not on file    Physically abused: Not on file    Forced sexual activity: Not on file  Other Topics Concern  . Not on file  Social History Narrative   Married. College: Yes. Exercise: Yes.     No Known Allergies   Prior to Admission medications   Medication Sig Start Date End Date Taking? Authorizing Provider  fluticasone (FLONASE) 50 MCG/ACT nasal spray Place 2 sprays into both nostrils daily. 09/25/18  Yes Rutherford Guys, MD  lisinopril-hydrochlorothiazide (PRINZIDE,ZESTORETIC) 10-12.5 MG tablet TAKE 1 TABLET BY MOUTH EVERY DAY 09/25/18  Yes Rutherford Guys, MD  omeprazole (PRILOSEC) 40 MG capsule Take 1 capsule (40 mg total) by mouth daily as needed. 09/25/18  Yes Rutherford Guys, MD  Suvorexant (BELSOMRA) 5 MG TABS Take 5 mg by mouth at bedtime. Patient not taking: Reported on 03/27/2019 02/06/19   Maryruth Hancock, MD     Depression screen [facility] 2/9 03/27/2019 02/06/2019 01/17/2019 01/02/2019 11/03/2018  Decreased Interest 0 0 0 0 0  Down, Depressed, Hopeless 0 0 0 0 0  PHQ - 2 Score 0 0 0 0 0     Fall Risk  03/27/2019 02/06/2019 01/17/2019 01/02/2019 11/03/2018  Falls in the past year? 0 0 0 0 0  Number falls in past yr: 0 0 0 0 0  Injury with Fall? 0 0 0 0 0  Follow up Falls evaluation completed;Education provided;Falls prevention discussed Falls evaluation completed Falls evaluation completed Falls evaluation completed -      PHYSICAL EXAM: BP 132/73 (BP Location: Right Arm, Patient Position: Sitting, Cuff Size: Large)   Pulse 80   Temp 98.2 F (36.8 C) (Oral)   Resp 16   Ht 5' (1.524 m)   Wt 181 lb 6.4 oz (82.3 kg)   HC 3" (7.6 cm)   SpO2 98%   BMI 35.43 kg/m    Wt Readings from Last 3 Encounters:  03/27/19 181 lb 6.4 oz (82.3 kg)  02/06/19 181 lb 3.2 oz (82.2 kg)  01/17/19 181 lb 12.8 oz (82.5 kg)      Immunization due - Plan: Flu vaccine HIGH DOSE PF (Fluzone High dose)    Physical Exam   Education/Counseling provided regarding diet and exercise, prevention of chronic diseases, smoking/tobacco cessation, if applicable, and reviewed "Covered Medicare Preventive Services."

## 2019-05-04 ENCOUNTER — Other Ambulatory Visit: Payer: Self-pay | Admitting: Family Medicine

## 2019-05-04 NOTE — Telephone Encounter (Signed)
Forwarding medication refill request to the clinical pool for review. 

## 2019-07-17 ENCOUNTER — Telehealth: Payer: Self-pay | Admitting: *Deleted

## 2019-07-17 NOTE — Telephone Encounter (Signed)
Schedule AWV.  

## 2019-08-30 DIAGNOSIS — H2513 Age-related nuclear cataract, bilateral: Secondary | ICD-10-CM | POA: Diagnosis not present

## 2019-08-30 DIAGNOSIS — H524 Presbyopia: Secondary | ICD-10-CM | POA: Diagnosis not present

## 2019-08-30 DIAGNOSIS — H5213 Myopia, bilateral: Secondary | ICD-10-CM | POA: Diagnosis not present

## 2019-08-30 DIAGNOSIS — H04123 Dry eye syndrome of bilateral lacrimal glands: Secondary | ICD-10-CM | POA: Diagnosis not present

## 2019-09-02 ENCOUNTER — Other Ambulatory Visit: Payer: Self-pay | Admitting: Family Medicine

## 2019-09-04 ENCOUNTER — Other Ambulatory Visit: Payer: Self-pay | Admitting: Family Medicine

## 2019-09-04 DIAGNOSIS — Z1231 Encounter for screening mammogram for malignant neoplasm of breast: Secondary | ICD-10-CM

## 2019-09-24 ENCOUNTER — Ambulatory Visit (INDEPENDENT_AMBULATORY_CARE_PROVIDER_SITE_OTHER): Payer: Medicare Other | Admitting: Family Medicine

## 2019-09-24 ENCOUNTER — Encounter: Payer: Self-pay | Admitting: Family Medicine

## 2019-09-24 ENCOUNTER — Other Ambulatory Visit: Payer: Self-pay

## 2019-09-24 VITALS — BP 120/58 | HR 88 | Temp 97.7°F | Resp 18 | Ht 63.5 in | Wt 185.0 lb

## 2019-09-24 DIAGNOSIS — I10 Essential (primary) hypertension: Secondary | ICD-10-CM

## 2019-09-24 DIAGNOSIS — M85851 Other specified disorders of bone density and structure, right thigh: Secondary | ICD-10-CM

## 2019-09-24 DIAGNOSIS — K219 Gastro-esophageal reflux disease without esophagitis: Secondary | ICD-10-CM | POA: Diagnosis not present

## 2019-09-24 MED ORDER — OMEPRAZOLE 40 MG PO CPDR: 40.0000 mg | DELAYED_RELEASE_CAPSULE | Freq: Every day | ORAL | 1 refills | Status: DC | PRN

## 2019-09-24 MED ORDER — LISINOPRIL-HYDROCHLOROTHIAZIDE 10-12.5 MG PO TABS: 1.0000 | ORAL_TABLET | Freq: Every day | ORAL | 1 refills | Status: DC

## 2019-09-24 NOTE — Patient Instructions (Addendum)
Osteopenia  Osteopenia is a loss of thickness (density) inside of the bones. Another name for osteopenia is low bone mass. Mild osteopenia is a normal part of aging. It is not a disease, and it does not cause symptoms. However, if you have osteopenia and continue to lose bone mass, you could develop a condition that causes the bones to become thin and break more easily (osteoporosis). You may also lose some height, have back pain, and have a stooped posture. Although osteopenia is not a disease, making changes to your lifestyle and diet can help to prevent osteopenia from developing into osteoporosis. What are the causes? Osteopenia is caused by loss of calcium in the bones.  Bones are constantly changing. Old bone cells are continually being replaced with new bone cells. This process builds new bone. The mineral calcium is needed to build new bone and maintain bone density. Bone density is usually highest around age 34. After that, most people's bodies cannot replace all the bone they have lost with new bone. What increases the risk? You are more likely to develop this condition if:  You are older than age 82.  You are a woman who went through menopause early.  You have a long illness that keeps you in bed.  You do not get enough exercise.  You lack certain nutrients (malnutrition).  You have an overactive thyroid gland (hyperthyroidism).  You smoke.  You drink a lot of alcohol.  You are taking medicines that weaken the bones, such as steroids. What are the signs or symptoms? This condition does not cause any symptoms. You may have a slightly higher risk for bone breaks (fractures), so getting fractures more easily than normal may be an indication of osteopenia. How is this diagnosed? Your health care provider can diagnose this condition with a special type of X-ray exam that measures bone density (dual-energy X-ray absorptiometry, DEXA). This test can measure bone density in  your hips, spine, and wrists. Osteopenia has no symptoms, so this condition is usually diagnosed after a routine bone density screening test is done for osteoporosis. This routine screening is usually done for:  Women who are age 68 or older.  Men who are age 61 or older. If you have risk factors for osteopenia, you may have the screening test at an earlier age. How is this treated? Making dietary and lifestyle changes can lower your risk for osteoporosis. If you have severe osteopenia that is close to becoming osteoporosis, your health care provider may prescribe medicines and dietary supplements such as calcium and vitamin D. These supplements help to rebuild bone density. Follow these instructions at home:   Take over-the-counter and prescription medicines only as told by your health care provider. These include vitamins and supplements.  Eat a diet that is high in calcium and vitamin D. ? Calcium is found in dairy products, beans, salmon, and leafy green vegetables like spinach and broccoli. ? Look for foods that have vitamin D and calcium added to them (fortified foods), such as orange juice, cereal, and bread.  Do 30 or more minutes of a weight-bearing exercise every day, such as walking, jogging, or playing a sport. These types of exercises strengthen the bones.  Take precautions at home to lower your risk of falling, such as: ? Keeping rooms well-lit and free of clutter, such as cords. ? Installing safety rails on stairs. ? Using rubber mats in the bathroom or other areas that are often wet or slippery.  Do  not use any products that contain nicotine or tobacco, such as cigarettes and e-cigarettes. If you need help quitting, ask your health care provider.  Avoid alcohol or limit alcohol intake to no more than 1 drink a day for nonpregnant women and 2 drinks a day for men. One drink equals 12 oz of beer, 5 oz of wine, or 1 oz of hard liquor.  Keep all follow-up visits as told by  your health care provider. This is important. Contact a health care provider if:  You have not had a bone density screening for osteoporosis and you are: ? A woman, age 67 or older. ? A man, age 21 or older.  You are a postmenopausal woman who has not had a bone density screening for osteoporosis.  You are older than age 53 and you want to know if you should have bone density screening for osteoporosis. Summary  Osteopenia is a loss of thickness (density) inside of the bones. Another name for osteopenia is low bone mass.  Osteopenia is not a disease, but it may increase your risk for a condition that causes the bones to become thin and break more easily (osteoporosis).  You may be at risk for osteopenia if you are older than age 43 or if you are a woman who went through early menopause.  Osteopenia does not cause any symptoms, but it can be diagnosed with a bone density screening test.  Dietary and lifestyle changes are the first treatment for osteopenia. These may lower your risk for osteoporosis. This information is not intended to replace advice given to you by your health care provider. Make sure you discuss any questions you have with your health care provider. Document Revised: 06/24/2017 Document Reviewed: 04/20/2017 Elsevier Patient Education  [employer].   If you have lab work done today you will be contacted with your lab results within the next 2 weeks.  If you have not heard from [place] then please contact us. The fastest way to get your results is to register for My Chart.   IF you received an x-ray today, you will receive an invoice from [facility]. Please contact [facility] at [phone] with questions or concerns regarding your invoice.   IF you received labwork today, you will receive an invoice from [place]. Please contact [facility] at [phone] with questions or concerns regarding your invoice.   Our billing staff will not be able to  assist you with questions regarding bills from these companies.  You will be contacted with the lab results as soon as they are available. The fastest way to get your results is to activate your My Chart account. Instructions are located on the last page of this paperwork. If you have not heard from [place] regarding the results in 2 weeks, please contact this office.

## 2019-09-24 NOTE — Progress Notes (Signed)
3/1/20219:16 AM  Frances Hunter 02-03-1951, 69 y.o., female [id number]  Chief Complaint  Patient presents with  . Hypertension  . Gastroesophageal Reflux    HPI:   Patient is a 69 y.o. female with past medical history significant for HTN, osteopenia, GERD, varicose veins, seasonal allergies who presents today for routine followup  Last OV Sept 2020 - no changes Due for dexa osteopenia, Jan 2019 Scheduled for mammo march 18th  She is overall doing well Takes mvit and extra calcium She has been trying to walk daily, unfortunately recently hurt her foot so has had to take a break  Taking BP med daily Checks BP at home, getting similar reading to today  GERD has been doing well Takes PPI as needed, twice a week  Has received her first covid vaccine, pfizer  There are no preventive care reminders to display for this patient.  Depression screen [facility] 2/9 09/24/2019 03/27/2019 02/06/2019  Decreased Interest 0 0 0  Down, Depressed, Hopeless 0 0 0  PHQ - 2 Score 0 0 0    Fall Risk  09/24/2019 03/27/2019 02/06/2019 01/17/2019 01/02/2019  Falls in the past year? 1 0 0 0 0  Number falls in past yr: 0 0 0 0 0  Injury with Fall? 1 0 0 0 0  Follow up Falls evaluation completed Falls evaluation completed;Education provided;Falls prevention discussed Falls evaluation completed Falls evaluation completed Falls evaluation completed     No Known Allergies  Prior to Admission medications   Medication Sig Start Date End Date Taking? Authorizing Provider  fluticasone (FLONASE) 50 MCG/ACT nasal spray Place 2 sprays into both nostrils daily. 09/25/18  Yes Rutherford Guys, MD  lisinopril-hydrochlorothiazide (ZESTORETIC) 10-12.5 MG tablet TAKE 1 TABLET BY MOUTH EVERY DAY 05/04/19  Yes Rutherford Guys, MD  omeprazole (PRILOSEC) 40 MG capsule TAKE 1 CAPSULE (40 MG TOTAL) BY MOUTH DAILY AS NEEDED. 09/02/19  Yes Rutherford Guys, MD    Past Medical History:  Diagnosis Date  . GERD (gastroesophageal  reflux disease)   . Hypertension   . Osteopenia    low Frax score, jan 2019  . Seasonal allergies     Past Surgical History:  Procedure Laterality Date  . TUBAL LIGATION      Social History   Tobacco Use  . Smoking status: Never Smoker  . Smokeless tobacco: Never Used  Substance Use Topics  . Alcohol use: Yes    Alcohol/week: 2.0 standard drinks    Types: 2 Glasses of wine per week    Family History  Problem Relation Age of Onset  . Cancer Father 50       lymphoma  . Hypertension Father   . Diabetes Brother   . Hyperlipidemia Brother   . Hypertension Brother   . Diabetes Maternal Grandfather   . Cancer Paternal Grandfather   . Liver disease Mother 49       cyst  . Hyperlipidemia Sister   . Breast cancer Neg Hx     Review of Systems  Constitutional: Negative for chills and fever.  Respiratory: Negative for cough and shortness of breath.   Cardiovascular: Negative for chest pain, palpitations and leg swelling.  Gastrointestinal: Negative for abdominal pain, nausea and vomiting.     OBJECTIVE:  Today's Vitals   09/24/19 0854 09/24/19 0922  BP: (!) 144/80 (!) 120/58  Pulse: 88   Resp: 18   Temp: 97.7 F (36.5 C)   TempSrc: Temporal   SpO2: 95%   Weight:  185 lb (83.9 kg)   Height: 5' 3.5" (1.613 m)    Body mass index is 32.26 kg/m.  Wt Readings from Last 3 Encounters:  09/24/19 185 lb (83.9 kg)  03/27/19 181 lb (82.1 kg)  03/27/19 181 lb 6.4 oz (82.3 kg)    Physical Exam Vitals and nursing note reviewed.  Constitutional:      Appearance: She is well-developed.  HENT:     Head: Normocephalic and atraumatic.     Mouth/Throat:     Pharynx: No oropharyngeal exudate.  Eyes:     General: No scleral icterus.    Conjunctiva/sclera: Conjunctivae normal.     Pupils: Pupils are equal, round, and reactive to light.  Cardiovascular:     Rate and Rhythm: Normal rate and regular rhythm.     Heart sounds: Normal heart sounds. No murmur. No friction rub.  No gallop.   Pulmonary:     Effort: Pulmonary effort is normal.     Breath sounds: Normal breath sounds. No wheezing or rales.  Musculoskeletal:     Cervical back: Neck supple.  Skin:    General: Skin is warm and dry.  Neurological:     Mental Status: She is alert and oriented to person, place, and time.     No results found for this or any previous visit (from the past 24 hour(s)).  No results found.   ASSESSMENT and PLAN  1. Essential hypertension Controlled. Continue current regime.  - Basic Metabolic Panel  2. Gastroesophageal reflux disease without esophagitis Controlled. Continue current regime.   3. Osteopenia of neck of right femur Checking dexa and vitamin D. Discussed healthy bone LFM, starting OTC D 1000 units a day - DG Bone Density; Future - Vitamin D, 25-hydroxy  Other orders - omeprazole (PRILOSEC) 40 MG capsule; Take 1 capsule (40 mg total) by mouth daily as needed. - lisinopril-hydrochlorothiazide (ZESTORETIC) 10-12.5 MG tablet; Take 1 tablet by mouth daily.  Return in about 6 months (around 03/26/2020).    Myles Lipps, MD Primary Care at [facility] [address] [place], [place] [address] Ph.  [phone] Fax [phone]

## 2019-09-25 LAB — BASIC METABOLIC PANEL
BUN/Creatinine Ratio: 27 (ref 12–28)
BUN: 21 mg/dL (ref 8–27)
CO2: 24 mmol/L (ref 20–29)
Calcium: 9.5 mg/dL (ref 8.7–10.3)
Chloride: 106 mmol/L (ref 96–106)
Creatinine, Ser: 0.79 mg/dL (ref 0.57–1.00)
GFR calc Af Amer: 89 mL/min/{1.73_m2} (ref >59)
GFR calc non Af Amer: 77 mL/min/{1.73_m2} (ref >59)
Glucose: 100 mg/dL — ABNORMAL HIGH (ref 65–99)
Potassium: 3.9 mmol/L (ref 3.5–5.2)
Sodium: 144 mmol/L (ref 134–144)

## 2019-09-25 LAB — VITAMIN D 25 HYDROXY (VIT D DEFICIENCY, FRACTURES): Vit D, 25-Hydroxy: 31.5 ng/mL (ref 30.0–100.0)

## 2019-10-11 ENCOUNTER — Ambulatory Visit
Admission: RE | Admit: 2019-10-11 | Discharge: 2019-10-11 | Disposition: A | Discharge status: Home / Self Care | Payer: Federal, State, Local not specified - PPO | Source: Ambulatory Visit | Attending: Family Medicine | Admitting: Family Medicine

## 2019-10-11 ENCOUNTER — Other Ambulatory Visit: Payer: Self-pay

## 2019-10-11 DIAGNOSIS — Z1231 Encounter for screening mammogram for malignant neoplasm of breast: Secondary | ICD-10-CM | POA: Diagnosis not present

## 2019-11-30 ENCOUNTER — Ambulatory Visit
Admission: RE | Admit: 2019-11-30 | Discharge: 2019-11-30 | Disposition: A | Discharge status: Home / Self Care | Payer: Federal, State, Local not specified - PPO | Source: Ambulatory Visit | Attending: Family Medicine | Admitting: Family Medicine

## 2019-11-30 ENCOUNTER — Other Ambulatory Visit: Payer: Self-pay

## 2019-11-30 DIAGNOSIS — Z780 Asymptomatic menopausal state: Secondary | ICD-10-CM | POA: Diagnosis not present

## 2019-11-30 DIAGNOSIS — M85851 Other specified disorders of bone density and structure, right thigh: Secondary | ICD-10-CM

## 2020-03-27 ENCOUNTER — Encounter: Payer: Self-pay | Admitting: Family Medicine

## 2020-03-27 ENCOUNTER — Ambulatory Visit (INDEPENDENT_AMBULATORY_CARE_PROVIDER_SITE_OTHER): Payer: Medicare Other | Admitting: Family Medicine

## 2020-03-27 ENCOUNTER — Other Ambulatory Visit: Payer: Self-pay

## 2020-03-27 VITALS — BP 136/72 | HR 76 | Temp 97.3°F | Ht 63.5 in | Wt 184.4 lb

## 2020-03-27 DIAGNOSIS — K219 Gastro-esophageal reflux disease without esophagitis: Secondary | ICD-10-CM

## 2020-03-27 DIAGNOSIS — I10 Essential (primary) hypertension: Secondary | ICD-10-CM | POA: Diagnosis not present

## 2020-03-27 DIAGNOSIS — Z23 Encounter for immunization: Secondary | ICD-10-CM

## 2020-03-27 DIAGNOSIS — M85851 Other specified disorders of bone density and structure, right thigh: Secondary | ICD-10-CM | POA: Diagnosis not present

## 2020-03-27 MED ORDER — FAMOTIDINE 20 MG PO TABS: 20.0000 mg | ORAL_TABLET | Freq: Two times a day (BID) | ORAL | 5 refills | Status: DC | PRN

## 2020-03-27 MED ORDER — LISINOPRIL-HYDROCHLOROTHIAZIDE 10-12.5 MG PO TABS: 1.0000 | ORAL_TABLET | Freq: Every day | ORAL | 1 refills | Status: DC

## 2020-03-27 MED ORDER — OMEPRAZOLE 40 MG PO CPDR: 40.0000 mg | DELAYED_RELEASE_CAPSULE | Freq: Every day | ORAL | 1 refills | Status: DC | PRN

## 2020-03-27 NOTE — Progress Notes (Signed)
9/2/20219:21 AM  Frances Hunter 07/17/1951, 70 y.o., female [id number]  Chief Complaint  Patient presents with  . Hypertension    HPI:   Patient is a 69 y.o. female with past medical history significant for HTN, osteopenia, GERD, varicose veins who presents today for routine followup  Last OV march 2021 - no changes, started D3 1000 units dexa may 2021 - osteopenia, with low FRAX score  She is overall doing well and has no acute concerns She takes her BP med daily She is taking ca/d3 and exercising (stationary bike and weights) Takes omeprazole prn, takes probiotics daily She has completed her covid vaccines She is requesting her flu vaccine today  BP Readings from Last 3 Encounters:  03/27/20 136/72  09/24/19 (!) 120/58  03/27/19 120/62   Wt Readings from Last 3 Encounters:  03/27/20 184 lb 6.4 oz (83.6 kg)  09/24/19 185 lb (83.9 kg)  03/27/19 181 lb (82.1 kg)   Lab Results  Component Value Date   CREATININE 0.79 09/24/2019   BUN 21 09/24/2019   NA 144 09/24/2019   K 3.9 09/24/2019   CL 106 09/24/2019   CO2 24 09/24/2019    Depression screen PHQ 2/9 09/24/2019 03/27/2019 02/06/2019  Decreased Interest 0 0 0  Down, Depressed, Hopeless 0 0 0  PHQ - 2 Score 0 0 0    Fall Risk  03/27/2020 09/24/2019 03/27/2019 02/06/2019 01/17/2019  Falls in the past year? 0 1 0 0 0  Number falls in past yr: 0 0 0 0 0  Injury with Fall? 0 1 0 0 0  Follow up - Falls evaluation completed Falls evaluation completed;Education provided;Falls prevention discussed Falls evaluation completed Falls evaluation completed     No Known Allergies  Prior to Admission medications   Medication Sig Start Date End Date Taking? Authorizing Provider  fluticasone (FLONASE) 50 MCG/ACT nasal spray Place 2 sprays into both nostrils daily. 09/25/18  Yes Rutherford Guys, MD  lisinopril-hydrochlorothiazide (ZESTORETIC) 10-12.5 MG tablet Take 1 tablet by mouth daily. 03/27/20   Rutherford Guys, MD  omeprazole  (PRILOSEC) 40 MG capsule Take 1 capsule (40 mg total) by mouth daily as needed. 03/27/20   Rutherford Guys, MD    Past Medical History:  Diagnosis Date  . GERD (gastroesophageal reflux disease)   . Hypertension   . Osteopenia    low Frax score, jan 2019  . Seasonal allergies     Past Surgical History:  Procedure Laterality Date  . TUBAL LIGATION      Social History   Tobacco Use  . Smoking status: Never Smoker  . Smokeless tobacco: Never Used  Substance Use Topics  . Alcohol use: Yes    Alcohol/week: 2.0 standard drinks    Types: 2 Glasses of wine per week    Family History  Problem Relation Age of Onset  . Cancer Father 64       lymphoma  . Hypertension Father   . Diabetes Brother   . Hyperlipidemia Brother   . Hypertension Brother   . Diabetes Maternal Grandfather   . Cancer Paternal Grandfather   . Liver disease Mother 21       cyst  . Hyperlipidemia Sister   . Breast cancer Neg Hx     Review of Systems  Constitutional: Negative for chills, fever and malaise/fatigue.  Respiratory: Negative for cough and shortness of breath.   Cardiovascular: Negative for chest pain, palpitations and leg swelling.  Gastrointestinal: Positive for heartburn. Negative  for abdominal pain, blood in stool, constipation, diarrhea, melena, nausea and vomiting.  Genitourinary: Negative for dysuria and hematuria.  Neurological: Negative for dizziness.     OBJECTIVE:  Today's Vitals   03/27/20 0855  BP: 136/72  Pulse: 76  Temp: (!) 97.3 F (36.3 C)  SpO2: 98%  Weight: 184 lb 6.4 oz (83.6 kg)  Height: 5' 3.5" (1.613 m)   Body mass index is 32.15 kg/m.   Physical Exam Vitals and nursing note reviewed.  Constitutional:      Appearance: She is well-developed.  HENT:     Head: Normocephalic and atraumatic.     Mouth/Throat:     Pharynx: No oropharyngeal exudate.  Eyes:     General: No scleral icterus.    Extraocular Movements: Extraocular movements intact.      Conjunctiva/sclera: Conjunctivae normal.     Pupils: Pupils are equal, round, and reactive to light.  Cardiovascular:     Rate and Rhythm: Normal rate and regular rhythm.     Heart sounds: Normal heart sounds. No murmur heard.  No friction rub. No gallop.   Pulmonary:     Effort: Pulmonary effort is normal.     Breath sounds: Normal breath sounds. No wheezing, rhonchi or rales.  Musculoskeletal:     Cervical back: Neck supple.     Right lower leg: No edema.     Left lower leg: No edema.  Skin:    General: Skin is warm and dry.  Neurological:     Mental Status: She is alert and oriented to person, place, and time.     No results found for this or any previous visit (from the past 24 hour(s)).  No results found.   ASSESSMENT and PLAN  1. Essential hypertension Controlled. Continue current regime.  - CMP14+EGFR - Lipid panel - TSH - lisinopril-hydrochlorothiazide (ZESTORETIC) 10-12.5 MG tablet; Take 1 tablet by mouth daily.  2. Need for prophylactic vaccination and inoculation against influenza - Flu Vaccine QUAD High Dose(Fluad)  3. Osteopenia of neck of right femur Cont working with LFM. Repeat dexa may 2023  4. Gastroesophageal reflux disease without esophagitis Improved with LFM, trial of H2B prn use  Other orders - famotidine (PEPCID) 20 MG tablet; Take 1 tablet (20 mg total) by mouth 2 (two) times daily as needed for heartburn or indigestion.  Return in about 6 months (around 09/24/2020).    Rutherford Guys, MD Primary Care at [place], [place] [address] Ph.  [phone] Fax [phone]

## 2020-03-27 NOTE — Patient Instructions (Signed)
° ° ° °  If you have lab work done today you will be contacted with your lab results within the next 2 weeks.  If you have not heard from us then please contact us. The fastest way to get your results is to register for My Chart. ° ° °IF you received an x-ray today, you will receive an invoice from [facility]. Please contact [facility] at [phone] with questions or concerns regarding your invoice.  ° °IF you received labwork today, you will receive an invoice from [facility]. Please contact [facility] at [phone] with questions or concerns regarding your invoice.  ° °Our billing staff will not be able to assist you with questions regarding bills from these companies. ° °You will be contacted with the lab results as soon as they are available. The fastest way to get your results is to activate your My Chart account. Instructions are located on the last page of this paperwork. If you have not heard from us regarding the results in 2 weeks, please contact this office. °  ° ° ° °

## 2020-03-28 LAB — CMP14+EGFR
ALT: 17 IU/L (ref 0–32)
AST: 15 IU/L (ref 0–40)
Albumin/Globulin Ratio: 1.4 (ref 1.2–2.2)
Albumin: 3.9 g/dL (ref 3.8–4.8)
Alkaline Phosphatase: 62 IU/L (ref 48–121)
BUN/Creatinine Ratio: 23 (ref 12–28)
BUN: 18 mg/dL (ref 8–27)
Bilirubin Total: 0.3 mg/dL (ref 0.0–1.2)
CO2: 26 mmol/L (ref 20–29)
Calcium: 9.2 mg/dL (ref 8.7–10.3)
Chloride: 105 mmol/L (ref 96–106)
Creatinine, Ser: 0.79 mg/dL (ref 0.57–1.00)
GFR calc Af Amer: 88 mL/min/{1.73_m2} (ref >59)
GFR calc non Af Amer: 77 mL/min/{1.73_m2} (ref >59)
Globulin, Total: 2.8 g/dL (ref 1.5–4.5)
Glucose: 91 mg/dL (ref 65–99)
Potassium: 3.7 mmol/L (ref 3.5–5.2)
Sodium: 143 mmol/L (ref 134–144)
Total Protein: 6.7 g/dL (ref 6.0–8.5)

## 2020-03-28 LAB — LIPID PANEL
Chol/HDL Ratio: 2.4 ratio (ref 0.0–4.4)
Cholesterol, Total: 136 mg/dL (ref 100–199)
HDL: 57 mg/dL (ref >39)
LDL Chol Calc (NIH): 69 mg/dL (ref 0–99)
Triglycerides: 41 mg/dL (ref 0–149)
VLDL Cholesterol Cal: 10 mg/dL (ref 5–40)

## 2020-03-28 LAB — TSH: TSH: 3.94 u[IU]/mL (ref 0.450–4.500)

## 2020-05-26 DIAGNOSIS — Z23 Encounter for immunization: Secondary | ICD-10-CM | POA: Diagnosis not present

## 2020-06-25 DIAGNOSIS — I83813 Varicose veins of bilateral lower extremities with pain: Secondary | ICD-10-CM | POA: Diagnosis not present

## 2020-07-04 DIAGNOSIS — H04123 Dry eye syndrome of bilateral lacrimal glands: Secondary | ICD-10-CM | POA: Diagnosis not present

## 2020-07-04 DIAGNOSIS — H524 Presbyopia: Secondary | ICD-10-CM | POA: Diagnosis not present

## 2020-07-04 DIAGNOSIS — H2513 Age-related nuclear cataract, bilateral: Secondary | ICD-10-CM | POA: Diagnosis not present

## 2020-07-04 DIAGNOSIS — H5213 Myopia, bilateral: Secondary | ICD-10-CM | POA: Diagnosis not present

## 2020-07-22 DIAGNOSIS — I8311 Varicose veins of right lower extremity with inflammation: Secondary | ICD-10-CM | POA: Diagnosis not present

## 2020-07-22 DIAGNOSIS — I8312 Varicose veins of left lower extremity with inflammation: Secondary | ICD-10-CM | POA: Diagnosis not present

## 2020-07-31 DIAGNOSIS — I83893 Varicose veins of bilateral lower extremities with other complications: Secondary | ICD-10-CM | POA: Diagnosis not present

## 2020-07-31 DIAGNOSIS — I83813 Varicose veins of bilateral lower extremities with pain: Secondary | ICD-10-CM | POA: Diagnosis not present

## 2020-08-14 DIAGNOSIS — I83893 Varicose veins of bilateral lower extremities with other complications: Secondary | ICD-10-CM | POA: Diagnosis not present

## 2020-08-14 DIAGNOSIS — I8312 Varicose veins of left lower extremity with inflammation: Secondary | ICD-10-CM | POA: Diagnosis not present

## 2020-08-14 DIAGNOSIS — I83812 Varicose veins of left lower extremities with pain: Secondary | ICD-10-CM | POA: Diagnosis not present

## 2020-08-28 DIAGNOSIS — I83811 Varicose veins of right lower extremities with pain: Secondary | ICD-10-CM | POA: Diagnosis not present

## 2020-08-28 DIAGNOSIS — I8311 Varicose veins of right lower extremity with inflammation: Secondary | ICD-10-CM | POA: Diagnosis not present

## 2020-08-28 DIAGNOSIS — M7981 Nontraumatic hematoma of soft tissue: Secondary | ICD-10-CM | POA: Diagnosis not present

## 2020-09-24 ENCOUNTER — Encounter: Payer: Self-pay | Admitting: Registered Nurse

## 2020-09-24 ENCOUNTER — Other Ambulatory Visit: Payer: Self-pay

## 2020-09-24 ENCOUNTER — Ambulatory Visit (INDEPENDENT_AMBULATORY_CARE_PROVIDER_SITE_OTHER): Payer: Medicare Other | Admitting: Registered Nurse

## 2020-09-24 VITALS — BP 167/67 | HR 85 | Temp 98.0°F | Resp 18 | Ht 63.0 in | Wt 182.8 lb

## 2020-09-24 DIAGNOSIS — G4709 Other insomnia: Secondary | ICD-10-CM

## 2020-09-24 DIAGNOSIS — Z8639 Personal history of other endocrine, nutritional and metabolic disease: Secondary | ICD-10-CM | POA: Diagnosis not present

## 2020-09-24 DIAGNOSIS — I10 Essential (primary) hypertension: Secondary | ICD-10-CM

## 2020-09-24 DIAGNOSIS — M85851 Other specified disorders of bone density and structure, right thigh: Secondary | ICD-10-CM

## 2020-09-24 DIAGNOSIS — I872 Venous insufficiency (chronic) (peripheral): Secondary | ICD-10-CM | POA: Diagnosis not present

## 2020-09-24 LAB — LIPID PANEL

## 2020-09-24 LAB — COMPREHENSIVE METABOLIC PANEL

## 2020-09-24 NOTE — Patient Instructions (Signed)
° ° ° °  If you have lab work done today you will be contacted with your lab results within the next 2 weeks.  If you have not heard from us then please contact us. The fastest way to get your results is to register for My Chart. ° ° °IF you received an x-ray today, you will receive an invoice from [facility]. Please contact [facility] at [phone] with questions or concerns regarding your invoice.  ° °IF you received labwork today, you will receive an invoice from [facility]. Please contact [facility] at [phone] with questions or concerns regarding your invoice.  ° °Our billing staff will not be able to assist you with questions regarding bills from these companies. ° °You will be contacted with the lab results as soon as they are available. The fastest way to get your results is to activate your My Chart account. Instructions are located on the last page of this paperwork. If you have not heard from us regarding the results in 2 weeks, please contact this office. °  ° ° ° °

## 2020-09-25 LAB — COMPREHENSIVE METABOLIC PANEL
ALT: 15 IU/L (ref 0–32)
AST: 14 IU/L (ref 0–40)
Albumin/Globulin Ratio: 1.7 (ref 1.2–2.2)
Alkaline Phosphatase: 62 IU/L (ref 44–121)
BUN/Creatinine Ratio: 29 — ABNORMAL HIGH (ref 12–28)
BUN: 20 mg/dL (ref 8–27)
Bilirubin Total: 0.3 mg/dL (ref 0.0–1.2)
Calcium: 9.1 mg/dL (ref 8.7–10.3)
Chloride: 105 mmol/L (ref 96–106)
Creatinine, Ser: 0.7 mg/dL (ref 0.57–1.00)
Globulin, Total: 2.4 g/dL (ref 1.5–4.5)
Glucose: 87 mg/dL (ref 65–99)
Potassium: 4.1 mmol/L (ref 3.5–5.2)
Sodium: 143 mmol/L (ref 134–144)
Total Protein: 6.5 g/dL (ref 6.0–8.5)
eGFR: 94 mL/min/{1.73_m2} (ref >59)

## 2020-09-25 LAB — CBC WITH DIFFERENTIAL
Basophils Absolute: 0.1 10*3/uL (ref 0.0–0.2)
Basos: 1 %
EOS (ABSOLUTE): 0 10*3/uL (ref 0.0–0.4)
Eos: 1 %
Hematocrit: 37.5 % (ref 34.0–46.6)
Hemoglobin: 12.4 g/dL (ref 11.1–15.9)
Immature Grans (Abs): 0 10*3/uL (ref 0.0–0.1)
Immature Granulocytes: 0 %
Lymphocytes Absolute: 1.8 10*3/uL (ref 0.7–3.1)
Lymphs: 33 %
MCH: 29.4 pg (ref 26.6–33.0)
MCHC: 33.1 g/dL (ref 31.5–35.7)
MCV: 89 fL (ref 79–97)
Monocytes Absolute: 0.4 10*3/uL (ref 0.1–0.9)
Monocytes: 7 %
Neutrophils Absolute: 3.1 10*3/uL (ref 1.4–7.0)
Neutrophils: 58 %
RBC: 4.22 x10E6/uL (ref 3.77–5.28)
RDW: 12.3 % (ref 11.7–15.4)
WBC: 5.3 10*3/uL (ref 3.4–10.8)

## 2020-09-25 LAB — VITAMIN D 25 HYDROXY (VIT D DEFICIENCY, FRACTURES): Vit D, 25-Hydroxy: 39.8 ng/mL (ref 30.0–100.0)

## 2020-09-25 LAB — TSH: TSH: 2.12 u[IU]/mL (ref 0.450–4.500)

## 2020-09-25 LAB — LIPID PANEL
Chol/HDL Ratio: 2.2 ratio (ref 0.0–4.4)
HDL: 62 mg/dL (ref >39)
LDL Chol Calc (NIH): 67 mg/dL (ref 0–99)
Triglycerides: 31 mg/dL (ref 0–149)
VLDL Cholesterol Cal: 8 mg/dL (ref 5–40)

## 2020-09-25 LAB — HEMOGLOBIN A1C
Est. average glucose Bld gHb Est-mCnc: 105 mg/dL
Hgb A1c MFr Bld: 5.3 % (ref 4.8–5.6)

## 2020-09-26 ENCOUNTER — Encounter: Payer: Self-pay | Admitting: Registered Nurse

## 2020-10-20 ENCOUNTER — Other Ambulatory Visit: Payer: Self-pay | Admitting: Registered Nurse

## 2020-10-20 DIAGNOSIS — Z1231 Encounter for screening mammogram for malignant neoplasm of breast: Secondary | ICD-10-CM

## 2020-10-24 ENCOUNTER — Other Ambulatory Visit: Payer: Self-pay

## 2020-10-24 ENCOUNTER — Ambulatory Visit
Admission: RE | Admit: 2020-10-24 | Discharge: 2020-10-24 | Disposition: A | Discharge status: Home / Self Care | Payer: Medicare Other | Source: Ambulatory Visit | Attending: Registered Nurse | Admitting: Registered Nurse

## 2020-10-24 DIAGNOSIS — Z1231 Encounter for screening mammogram for malignant neoplasm of breast: Secondary | ICD-10-CM | POA: Diagnosis not present

## 2021-02-17 DIAGNOSIS — M25561 Pain in right knee: Secondary | ICD-10-CM | POA: Diagnosis not present

## 2021-03-27 ENCOUNTER — Encounter: Payer: Self-pay | Admitting: Registered Nurse

## 2021-03-27 ENCOUNTER — Other Ambulatory Visit: Payer: Self-pay

## 2021-03-27 ENCOUNTER — Ambulatory Visit (INDEPENDENT_AMBULATORY_CARE_PROVIDER_SITE_OTHER): Payer: Medicare Other | Admitting: Registered Nurse

## 2021-03-27 VITALS — BP 120/70 | HR 86 | Temp 97.6°F | Resp 20 | Ht 63.0 in | Wt 185.6 lb

## 2021-03-27 DIAGNOSIS — I10 Essential (primary) hypertension: Secondary | ICD-10-CM

## 2021-03-27 DIAGNOSIS — J301 Allergic rhinitis due to pollen: Secondary | ICD-10-CM | POA: Diagnosis not present

## 2021-03-27 DIAGNOSIS — Z8639 Personal history of other endocrine, nutritional and metabolic disease: Secondary | ICD-10-CM | POA: Diagnosis not present

## 2021-03-27 DIAGNOSIS — H6121 Impacted cerumen, right ear: Secondary | ICD-10-CM | POA: Diagnosis not present

## 2021-03-27 DIAGNOSIS — M85851 Other specified disorders of bone density and structure, right thigh: Secondary | ICD-10-CM | POA: Diagnosis not present

## 2021-03-27 DIAGNOSIS — Z23 Encounter for immunization: Secondary | ICD-10-CM

## 2021-03-27 LAB — CBC WITH DIFFERENTIAL/PLATELET
Basophils Absolute: 0 10*3/uL (ref 0.0–0.1)
Basophils Relative: 0.8 % (ref 0.0–3.0)
Eosinophils Absolute: 0.1 10*3/uL (ref 0.0–0.7)
Eosinophils Relative: 2.7 % (ref 0.0–5.0)
HCT: 40.4 % (ref 36.0–46.0)
Hemoglobin: 13.2 g/dL (ref 12.0–15.0)
Lymphocytes Relative: 26.1 % (ref 12.0–46.0)
Lymphs Abs: 1.3 10*3/uL (ref 0.7–4.0)
MCHC: 32.8 g/dL (ref 30.0–36.0)
MCV: 90.3 fl (ref 78.0–100.0)
Monocytes Absolute: 0.5 10*3/uL (ref 0.1–1.0)
Monocytes Relative: 9 % (ref 3.0–12.0)
Neutro Abs: 3.2 10*3/uL (ref 1.4–7.7)
Neutrophils Relative %: 61.4 % (ref 43.0–77.0)
Platelets: 246 10*3/uL (ref 150.0–400.0)
RBC: 4.47 Mil/uL (ref 3.87–5.11)
RDW: 13.9 % (ref 11.5–15.5)
WBC: 5.2 10*3/uL (ref 4.0–10.5)

## 2021-03-27 LAB — COMPREHENSIVE METABOLIC PANEL
ALT: 22 U/L (ref 0–35)
AST: 22 U/L (ref 0–37)
Albumin: 4.1 g/dL (ref 3.5–5.2)
Alkaline Phosphatase: 63 U/L (ref 39–117)
BUN: 18 mg/dL (ref 6–23)
CO2: 32 mEq/L (ref 19–32)
Calcium: 9.6 mg/dL (ref 8.4–10.5)
Chloride: 101 mEq/L (ref 96–112)
Creatinine, Ser: 0.8 mg/dL (ref 0.40–1.20)
GFR: 74.66 mL/min (ref >60.00)
Glucose, Bld: 84 mg/dL (ref 70–99)
Potassium: 4.4 mEq/L (ref 3.5–5.1)
Sodium: 141 mEq/L (ref 135–145)
Total Bilirubin: 0.5 mg/dL (ref 0.2–1.2)
Total Protein: 7.3 g/dL (ref 6.0–8.3)

## 2021-03-27 LAB — LIPID PANEL
Cholesterol: 145 mg/dL (ref 0–200)
HDL: 62.9 mg/dL (ref >39.00)
LDL Cholesterol: 72 mg/dL (ref 0–99)
NonHDL: 82.36
Total CHOL/HDL Ratio: 2
Triglycerides: 52 mg/dL (ref 0.0–149.0)
VLDL: 10.4 mg/dL (ref 0.0–40.0)

## 2021-03-27 LAB — HEMOGLOBIN A1C: Hgb A1c MFr Bld: 5.8 % (ref 4.6–6.5)

## 2021-03-27 MED ORDER — LISINOPRIL-HYDROCHLOROTHIAZIDE 10-12.5 MG PO TABS: 1.0000 | ORAL_TABLET | Freq: Every day | ORAL | 1 refills | Status: DC

## 2021-03-27 MED ORDER — FLUTICASONE PROPIONATE 50 MCG/ACT NA SUSP: 2.0000 | Freq: Every day | NASAL | 11 refills | Status: DC

## 2021-03-27 NOTE — Progress Notes (Signed)
Established Patient Office Visit  Subjective:  Patient ID: Frances Hunter, female    DOB: September 21, 1950  Age: 70 y.o. MRN: [id number]  CC:  Chief Complaint  Patient presents with   Follow-up    6 month recheck    HPI Frances Hunter presents for 6 mo follow up  Hypertension: Patient Currently taking: lisinopril-hctz 10-12.27m PO qd Good effect. No AEs. Denies CV symptoms including: chest pain, shob, doe, headache, visual changes, fatigue, claudication, and dependent edema.   Previous readings and labs: BP Readings from Last 3 Encounters:  03/27/21 120/70  09/24/20 (!) 167/67  03/27/20 136/72   Lab Results  Component Value Date   CREATININE 0.80 03/27/2021      Also notes impacted cerumen Pressure in R ear Worse with some recent travel No drainage. Mildly muffled hearing.  No pain  Notes she needs a refill on flonase.  Uses daily for allergies Good effect. Has been helping with her ear. No AE.   Due for flu shot.  Has had in past with good effect, no AE No contraindications  Past Medical History:  Diagnosis Date   GERD (gastroesophageal reflux disease)    Hypertension    Osteopenia    low Frax score, jan 2019   Seasonal allergies     Past Surgical History:  Procedure Laterality Date   TUBAL LIGATION      Family History  Problem Relation Age of Onset   Hypertension Father    Lymphoma Father [id number]      lymphoma   Diabetes Brother    Hyperlipidemia Brother    Hypertension Brother    Diabetes Maternal Grandfather    Lymphoma Paternal Grandfather    Liver disease Mother 612      cyst   Hyperlipidemia Sister    Breast cancer Neg Hx     Social History   Socioeconomic History   Marital status: Married    Spouse name: Not on file   Number of children: 3   Years of education: Not on file   Highest education level: Not on file  Occupational History   Occupation: Retired  Tobacco Use   Smoking status: Never   Smokeless tobacco: Never   Vaping Use   Vaping Use: Never used  Substance and Sexual Activity   Alcohol use: Yes    Alcohol/week: 1.0 standard drink    Types: 1 Glasses of wine per week    Comment: spec occasions   Drug use: No   Sexual activity: Yes  Other Topics Concern   Not on file  Social History Narrative   Married. College: Yes. Exercise: Yes.   Social Determinants of Health   Financial Resource Strain: Not on file  Food Insecurity: Not on file  Transportation Needs: Not on file  Physical Activity: Not on file  Stress: Not on file  Social Connections: Not on file  Intimate Partner Violence: Not on file    Outpatient Medications Prior to Visit  Medication Sig Dispense Refill   famotidine (PEPCID) 20 MG tablet Take 1 tablet (20 mg total) by mouth 2 (two) times daily as needed for heartburn or indigestion. 60 tablet 5   lisinopril-hydrochlorothiazide (ZESTORETIC) 10-12.5 MG tablet Take 1 tablet by mouth daily. 90 tablet 1   meloxicam (MOBIC) 15 MG tablet Take 15 mg by mouth daily.     fluticasone (FLONASE) 50 MCG/ACT nasal spray Place 2 sprays into both nostrils daily. 16 g 11   No facility-administered medications prior  to visit.    No Known Allergies  ROS Review of Systems  Constitutional: Negative.   HENT: Negative.    Eyes: Negative.   Respiratory: Negative.    Cardiovascular: Negative.   Gastrointestinal: Negative.   Genitourinary: Negative.   Musculoskeletal: Negative.   Skin: Negative.   Neurological: Negative.   Psychiatric/Behavioral: Negative.    All other systems reviewed and are negative.    Objective:    Physical Exam Vitals and nursing note reviewed.  Constitutional:      General: She is not in acute distress.    Appearance: Normal appearance. She is normal weight. She is not ill-appearing, toxic-appearing or diaphoretic.  HENT:     Right Ear: Tympanic membrane, ear canal and external ear normal. There is impacted cerumen.     Left Ear: Tympanic membrane, ear  canal and external ear normal. There is no impacted cerumen.  Cardiovascular:     Rate and Rhythm: Normal rate and regular rhythm.     Heart sounds: Normal heart sounds. No murmur heard.   No friction rub. No gallop.  Pulmonary:     Effort: Pulmonary effort is normal. No respiratory distress.     Breath sounds: Normal breath sounds. No stridor. No wheezing, rhonchi or rales.  Chest:     Chest wall: No tenderness.  Skin:    General: Skin is warm and dry.  Neurological:     General: No focal deficit present.     Mental Status: She is alert and oriented to person, place, and time. Mental status is at baseline.  Psychiatric:        Mood and Affect: Mood normal.        Behavior: Behavior normal.        Thought Content: Thought content normal.        Judgment: Judgment normal.    BP 120/70   Pulse 86   Temp 97.6 F (36.4 C) (Temporal)   Resp 20   Ht 5' 3"  (1.6 m)   Wt 185 lb 9.6 oz (84.2 kg)   SpO2 98%   BMI 32.88 kg/m  Wt Readings from Last 3 Encounters:  03/27/21 185 lb 9.6 oz (84.2 kg)  09/24/20 182 lb 12.8 oz (82.9 kg)  03/27/20 184 lb 6.4 oz (83.6 kg)     Health Maintenance Due  Topic Date Due   COVID-19 Vaccine (1) Never done   Zoster Vaccines- Shingrix (1 of 2) Never done    There are no preventive care reminders to display for this patient.  Lab Results  Component Value Date   TSH 2.120 09/24/2020   Lab Results  Component Value Date   WBC 5.2 03/27/2021   HGB 13.2 03/27/2021   HCT 40.4 03/27/2021   MCV 90.3 03/27/2021   PLT 246.0 03/27/2021   Lab Results  Component Value Date   NA 141 03/27/2021   K 4.4 03/27/2021   CO2 32 03/27/2021   GLUCOSE 84 03/27/2021   BUN 18 03/27/2021   CREATININE 0.80 03/27/2021   BILITOT 0.5 03/27/2021   ALKPHOS 63 03/27/2021   AST 22 03/27/2021   ALT 22 03/27/2021   PROT 7.3 03/27/2021   ALBUMIN 4.1 03/27/2021   CALCIUM 9.6 03/27/2021   EGFR 94 09/24/2020   GFR 74.66 03/27/2021   Lab Results  Component Value  Date   CHOL 145 03/27/2021   Lab Results  Component Value Date   HDL 62.90 03/27/2021   Lab Results  Component Value Date   LDLCALC  72 03/27/2021   Lab Results  Component Value Date   TRIG 52.0 03/27/2021   Lab Results  Component Value Date   CHOLHDL 2 03/27/2021   Lab Results  Component Value Date   HGBA1C 5.8 03/27/2021      Assessment & Plan:   Problem List Items Addressed This Visit       Cardiovascular and Mediastinum   Essential hypertension   Relevant Orders   CBC with Differential/Platelet (Completed)   Comprehensive metabolic panel (Completed)   Lipid panel (Completed)     Respiratory   Allergic rhinitis - Primary   Relevant Medications   fluticasone (FLONASE) 50 MCG/ACT nasal spray     Musculoskeletal and Integument   Osteopenia   Other Visit Diagnoses     History of elevated glucose       Relevant Orders   Hemoglobin A1c (Completed)   Impacted cerumen, right ear       Flu vaccine need       Relevant Orders   Flu Vaccine QUAD High Dose(Fluad) (Completed)       Meds ordered this encounter  Medications   fluticasone (FLONASE) 50 MCG/ACT nasal spray    Sig: Place 2 sprays into both nostrils daily.    Dispense:  16 g    Refill:  11    Follow-up: No follow-ups on file.   PLAN BP continues to show remarkable control continue current regimen Labs collected. Will follow up with the patient as warranted. Lavage by Gabriel Cirri CMA effective. TM visualized, pearly gray. Flu vaccine given today Refill flonase Return in 6 mo Patient encouraged to call clinic with any questions, comments, or concerns.  Maximiano Coss, NP

## 2021-04-21 ENCOUNTER — Telehealth: Payer: Self-pay | Admitting: Registered Nurse

## 2021-04-21 NOTE — Telephone Encounter (Signed)
Left message for patient to call back and schedule Medicare Annual Wellness Visit (AWV) in office.  ° °If not able to come in office, please offer to do virtually or by telephone.  Left office number and my jabber #[phone]. ° °Last AWV:03/27/2019 ° °Please schedule at anytime with Nurse Health Advisor. °  °

## 2021-06-04 ENCOUNTER — Ambulatory Visit (INDEPENDENT_AMBULATORY_CARE_PROVIDER_SITE_OTHER): Payer: Medicare Other

## 2021-06-04 DIAGNOSIS — Z1211 Encounter for screening for malignant neoplasm of colon: Secondary | ICD-10-CM | POA: Diagnosis not present

## 2021-06-04 DIAGNOSIS — Z0000 Encounter for general adult medical examination without abnormal findings: Secondary | ICD-10-CM

## 2021-06-04 NOTE — Patient Instructions (Signed)
Frances Hunter , Thank you for taking time to come for your Medicare Wellness Visit. I appreciate your ongoing commitment to your health goals. Please review the following plan we discussed and let me know if I can assist you in the future.   Screening recommendations/referrals: Colonoscopy: referral 06/04/2021 Mammogram: 10/24/2020 Bone Density: 11/30/2019 Recommended yearly ophthalmology/optometry visit for glaucoma screening and checkup Recommended yearly dental visit for hygiene and checkup  Vaccinations: Influenza vaccine: completed  Pneumococcal vaccine: completed  Tdap vaccine: 05/22/2015 Shingles vaccine: received per patient     Advanced directives: will provide   Conditions/risks identified: none   Next appointment: 06/05/2021  1030  Frances Hunter ,NP    Preventive Care 65 Years and Older, Female Preventive care refers to lifestyle choices and visits with your health care provider that can promote health and wellness. What does preventive care include? A yearly physical exam. This is also called an annual well check. Dental exams once or twice a year. Routine eye exams. Ask your health care provider how often you should have your eyes checked. Personal lifestyle choices, including: Daily care of your teeth and gums. Regular physical activity. Eating a healthy diet. Avoiding tobacco and drug use. Limiting alcohol use. Practicing safe sex. Taking low-dose aspirin every day. Taking vitamin and mineral supplements as recommended by your health care provider. What happens during an annual well check? The services and screenings done by your health care provider during your annual well check will depend on your age, overall health, lifestyle risk factors, and family history of disease. Counseling  Your health care provider may ask you questions about your: Alcohol use. Tobacco use. Drug use. Emotional well-being. Home and relationship well-being. Sexual  activity. Eating habits. History of falls. Memory and ability to understand (cognition). Work and work Astronomer. Reproductive health. Screening  You may have the following tests or measurements: Height, weight, and BMI. Blood pressure. Lipid and cholesterol levels. These may be checked every 5 years, or more frequently if you are over 33 years old. Skin check. Lung cancer screening. You may have this screening every year starting at age 55 if you have a 30-pack-year history of smoking and currently smoke or have quit within the past 15 years. Fecal occult blood test (FOBT) of the stool. You may have this test every year starting at age 53. Flexible sigmoidoscopy or colonoscopy. You may have a sigmoidoscopy every 5 years or a colonoscopy every 10 years starting at age 3. Hepatitis C blood test. Hepatitis B blood test. Sexually transmitted disease (STD) testing. Diabetes screening. This is done by checking your blood sugar (glucose) after you have not eaten for a while (fasting). You may have this done every 1-3 years. Bone density scan. This is done to screen for osteoporosis. You may have this done starting at age 53. Mammogram. This may be done every 1-2 years. Talk to your health care provider about how often you should have regular mammograms. Talk with your health care provider about your test results, treatment options, and if necessary, the need for more tests. Vaccines  Your health care provider may recommend certain vaccines, such as: Influenza vaccine. This is recommended every year. Tetanus, diphtheria, and acellular pertussis (Tdap, Td) vaccine. You may need a Td booster every 10 years. Zoster vaccine. You may need this after age 12. Pneumococcal 13-valent conjugate (PCV13) vaccine. One dose is recommended after age 40. Pneumococcal polysaccharide (PPSV23) vaccine. One dose is recommended after age 37. Talk to your health care provider about  which screenings and vaccines  you need and how often you need them. This information is not intended to replace advice given to you by your health care provider. Make sure you discuss any questions you have with your health care provider. Document Released: 08/08/2015 Document Revised: 03/31/2016 Document Reviewed: 05/13/2015 Elsevier Interactive Patient Education  2017 [employer] Prevention in the Home Falls can cause injuries. They can happen to people of all ages. There are many things you can do to make your home safe and to help prevent falls. What can I do on the outside of my home? Regularly fix the edges of walkways and driveways and fix any cracks. Remove anything that might make you trip as you walk through a door, such as a raised step or threshold. Trim any bushes or trees on the path to your home. Use bright outdoor lighting. Clear any walking paths of anything that might make someone trip, such as rocks or tools. Regularly check to see if handrails are loose or broken. Make sure that both sides of any steps have handrails. Any raised decks and porches should have guardrails on the edges. Have any leaves, snow, or ice cleared regularly. Use sand or salt on walking paths during winter. Clean up any spills in your garage right away. This includes oil or grease spills. What can I do in the bathroom? Use night lights. Install grab bars by the toilet and in the tub and shower. Do not use towel bars as grab bars. Use non-skid mats or decals in the tub or shower. If you need to sit down in the shower, use a plastic, non-slip stool. Keep the floor dry. Clean up any water that spills on the floor as soon as it happens. Remove soap buildup in the tub or shower regularly. Attach bath mats securely with double-sided non-slip rug tape. Do not have throw rugs and other things on the floor that can make you trip. What can I do in the bedroom? Use night lights. Make sure that you have a light by your bed that  is easy to reach. Do not use any sheets or blankets that are too big for your bed. They should not hang down onto the floor. Have a firm chair that has side arms. You can use this for support while you get dressed. Do not have throw rugs and other things on the floor that can make you trip. What can I do in the kitchen? Clean up any spills right away. Avoid walking on wet floors. Keep items that you use a lot in easy-to-reach places. If you need to reach something above you, use a strong step stool that has a grab bar. Keep electrical cords out of the way. Do not use floor polish or wax that makes floors slippery. If you must use wax, use non-skid floor wax. Do not have throw rugs and other things on the floor that can make you trip. What can I do with my stairs? Do not leave any items on the stairs. Make sure that there are handrails on both sides of the stairs and use them. Fix handrails that are broken or loose. Make sure that handrails are as long as the stairways. Check any carpeting to make sure that it is firmly attached to the stairs. Fix any carpet that is loose or worn. Avoid having throw rugs at the top or bottom of the stairs. If you do have throw rugs, attach them to the floor with  carpet tape. Make sure that you have a light switch at the top of the stairs and the bottom of the stairs. If you do not have them, ask someone to add them for you. What else can I do to help prevent falls? Wear shoes that: Do not have high heels. Have rubber bottoms. Are comfortable and fit you well. Are closed at the toe. Do not wear sandals. If you use a stepladder: Make sure that it is fully opened. Do not climb a closed stepladder. Make sure that both sides of the stepladder are locked into place. Ask someone to hold it for you, if possible. Clearly mark and make sure that you can see: Any grab bars or handrails. First and last steps. Where the edge of each step is. Use tools that help you  move around (mobility aids) if they are needed. These include: Canes. Walkers. Scooters. Crutches. Turn on the lights when you go into a dark area. Replace any light bulbs as soon as they burn out. Set up your furniture so you have a clear path. Avoid moving your furniture around. If any of your floors are uneven, fix them. If there are any pets around you, be aware of where they are. Review your medicines with your doctor. Some medicines can make you feel dizzy. This can increase your chance of falling. Ask your doctor what other things that you can do to help prevent falls. This information is not intended to replace advice given to you by your health care provider. Make sure you discuss any questions you have with your health care provider. Document Released: 05/08/2009 Document Revised: 12/18/2015 Document Reviewed: 08/16/2014 Elsevier Interactive Patient Education  2017 [employer].

## 2021-06-04 NOTE — Progress Notes (Signed)
Subjective:   Frances Hunter is a 70 y.o. female who presents for Medicare Annual (Subsequent) preventive examination.  I connected with Tomicka Lover today by telephone and verified that I am speaking with the correct person using two identifiers. Location patient: home Location provider: work Persons participating in the virtual visit: patient, provider.   I discussed the limitations, risks, security and privacy concerns of performing an evaluation and management service by telephone and the availability of in person appointments. I also discussed with the patient that there may be a patient responsible charge related to this service. The patient expressed understanding and verbally consented to this telephonic visit.    Interactive audio and video telecommunications were attempted between this provider and patient, however failed, due to patient having technical difficulties OR patient did not have access to video capability.  We continued and completed visit with audio only.    Review of Systems     Cardiac Risk Factors include: advanced age (>19men, >56 women);hypertension     Objective:    Today's Vitals   There is no height or weight on file to calculate BMI.  Advanced Directives 06/04/2021 03/27/2019  Does Patient Have a Medical Advance Directive? Yes No  Type of Estate agent of [place];Living will -  Copy of Healthcare Power of Attorney in Chart? No - copy requested -  Would patient like information on creating a medical advance directive? - No - Patient declined    Current Medications (verified) Outpatient Encounter Medications as of 06/04/2021  Medication Sig   famotidine (PEPCID) 20 MG tablet Take 1 tablet (20 mg total) by mouth 2 (two) times daily as needed for heartburn or indigestion.   fluticasone (FLONASE) 50 MCG/ACT nasal spray Place 2 sprays into both nostrils daily.   lisinopril-hydrochlorothiazide (ZESTORETIC) 10-12.5 MG tablet  Take 1 tablet by mouth daily.   meloxicam (MOBIC) 15 MG tablet Take 15 mg by mouth daily.   No facility-administered encounter medications on file as of 06/04/2021.    Allergies (verified) Patient has no known allergies.   History: Past Medical History:  Diagnosis Date   GERD (gastroesophageal reflux disease)    Hypertension    Osteopenia    low Frax score, jan 2019   Seasonal allergies    Past Surgical History:  Procedure Laterality Date   TUBAL LIGATION     Family History  Problem Relation Age of Onset   Hypertension Father    Lymphoma Father 10       lymphoma   Diabetes Brother    Hyperlipidemia Brother    Hypertension Brother    Diabetes Maternal Grandfather    Lymphoma Paternal Grandfather    Liver disease Mother 44       cyst   Hyperlipidemia Sister    Breast cancer Neg Hx    Social History   Socioeconomic History   Marital status: Married    Spouse name: Not on file   Number of children: 3   Years of education: Not on file   Highest education level: Not on file  Occupational History   Occupation: Retired  Tobacco Use   Smoking status: Never   Smokeless tobacco: Never  Vaping Use   Vaping Use: Never used  Substance and Sexual Activity   Alcohol use: Yes    Alcohol/week: 1.0 standard drink    Types: 1 Glasses of wine per week    Comment: spec occasions   Drug use: No   Sexual activity: Yes  Other Topics Concern   Not on file  Social History Narrative   Married. College: Yes. Exercise: Yes.   Social Determinants of Health   Financial Resource Strain: Low Risk    Difficulty of Paying Living Expenses: Not hard at all  Food Insecurity: No Food Insecurity   Worried About Programme researcher, broadcasting/film/video in the Last Year: Never true   Ran Out of Food in the Last Year: Never true  Transportation Needs: No Transportation Needs   Lack of Transportation (Medical): No   Lack of Transportation (Non-Medical): No  Physical Activity: Sufficiently Active   Days of  Exercise per Week: 3 days   Minutes of Exercise per Session: 60 min  Stress: No Stress Concern Present   Feeling of Stress : Not at all  Social Connections: Socially Integrated   Frequency of Communication with Friends and Family: Twice a week   Frequency of Social Gatherings with Friends and Family: Twice a week   Attends Religious Services: More than 4 times per year   Active Member of [employer] or Organizations: Yes   Attends Engineer, structural: More than 4 times per year   Marital Status: Married    Tobacco Counseling Counseling given: Not Answered   Clinical Intake:  Pre-visit preparation completed: Yes  Pain : No/denies pain     Nutritional Risks: None Diabetes: No  How often do you need to have someone help you when you read instructions, pamphlets, or other written materials from your doctor or pharmacy?: 1 - Never What is the last grade level you completed in school?: college  Diabetic?no  Interpreter Needed?: No  Information entered by :: L.Keiana Tavella,LPN   Activities of Daily Living In your present state of health, do you have any difficulty performing the following activities: 06/04/2021 03/27/2021  Hearing? N Y  Comment - rt ear stopped up  Vision? 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 N N  Walking or climbing stairs? N N  Dressing or bathing? N N  Doing errands, shopping? N N  Preparing Food and eating ? N -  Using the Toilet? N -  In the past six months, have you accidently leaked urine? N -  Do you have problems with loss of bowel control? N -  Managing your Medications? N -  Managing your Finances? N -  Housekeeping or managing your Housekeeping? N -  Some recent data might be hidden    Patient Care Team: [name], NP as PCP - General (Adult Health Nurse Practitioner)  Indicate any recent Medical Services you may have received from other than [facility] providers in the past year  (date may be approximate).     Assessment:   This is a routine wellness examination for Frances Hunter.  Hearing/Vision screen Vision Screening - Comments:: Annual eye exams wear glasses and contacts   Dietary issues and exercise activities discussed: Current Exercise Habits: Home exercise routine, Type of exercise: walking, Time (Minutes): 60, Frequency (Times/Week): 3, Weekly Exercise (Minutes/Week): 180, Intensity: Mild   Goals Addressed             This Visit's Progress    Patient Stated   On track    Loose weight       Depression Screen PHQ 2/9 Scores 06/04/2021 06/04/2021 03/27/2021 09/24/2020 09/24/2019 03/27/2019 02/06/2019  PHQ - 2 Score 0 0 0 0 0 0 0  PHQ- 9 Score - - 1 - - - -  Fall Risk Fall Risk  06/04/2021 03/27/2021 09/24/2020 03/27/2020 09/24/2019  Falls in the past year? 0 1 0 0 1  Number falls in past yr: 0 0 0 0 0  Injury with Fall? 0 0 0 0 1  Follow up Falls evaluation completed - Falls evaluation completed - Falls evaluation completed    FALL RISK PREVENTION PERTAINING TO THE HOME:  Any stairs in or around the home? No  If so, are there any without handrails? No  Home free of loose throw rugs in walkways, pet beds, electrical cords, etc? Yes  Adequate lighting in your home to reduce risk of falls? Yes   ASSISTIVE DEVICES UTILIZED TO PREVENT FALLS:  Life alert? No  Use of a cane, walker or w/c? No  Grab bars in the bathroom? Yes  Shower chair or bench in shower? No  Elevated toilet seat or a handicapped toilet? No    Cognitive Function:  Normal cognitive status assessed by direct observation by this Nurse Health Advisor. No abnormalities found.     6CIT Screen 03/27/2019  What Year? 0 points  What month? 0 points  What time? 0 points  Count back from 20 0 points  Months in reverse 0 points  Repeat phrase 0 points  Total Score 0    Immunizations Immunization History  Administered Date(s) Administered   Fluad Quad(high Dose 65+) 03/27/2019,  03/27/2020, 03/27/2021   Influenza, High Dose Seasonal PF 09/25/2018, 03/26/2020   Influenza,inj,Quad PF,6+ Mos 05/17/2014, 05/22/2015, 05/27/2016, 06/11/2017   Pneumococcal Conjugate-13 05/27/2016   Pneumococcal Polysaccharide-23 02/23/2010, 06/11/2017   Tdap 02/24/2007, 05/22/2015    TDAP status: Up to date  Flu Vaccine status: Up to date  Pneumococcal vaccine status: Up to date  Covid-19 vaccine status: Completed vaccines  Qualifies for Shingles Vaccine? Yes   Zostavax completed No   Shingrix Completed?: Yes  Screening Tests Health Maintenance  Topic Date Due   COVID-19 Vaccine (1) Never done   Zoster Vaccines- Shingrix (1 of 2) Never done   Fecal DNA (Cologuard)  09/24/2021 (Originally 08/12/2020)   MAMMOGRAM  10/24/2021   TETANUS/TDAP  05/21/2025   Pneumonia Vaccine 53+ Years old  Completed   INFLUENZA VACCINE  Completed   DEXA SCAN  Completed   Hepatitis C Screening  Completed   HPV VACCINES  Aged Out    Health Maintenance  Health Maintenance Due  Topic Date Due   COVID-19 Vaccine (1) Never done   Zoster Vaccines- Shingrix (1 of 2) Never done    Colorectal cancer screening: Referral to GI placed 06/04/2021. Pt aware the office will call re: appt.  Mammogram status: Completed 10/24/2020. Repeat every year  Bone Density status: Completed 11/30/2019. Results reflect: Bone density results: OSTEOPENIA. Repeat every 5 years.  Lung Cancer Screening: (Low Dose CT Chest recommended if Age 42-80 years, 30 pack-year currently smoking OR have quit w/in 15years.) does not qualify.   Lung Cancer Screening Referral: n/a  Additional Screening:  Hepatitis C Screening: does not qualify; Completed 05/27/2016  Vision Screening: Recommended annual ophthalmology exams for early detection of glaucoma and other disorders of the eye. Is the patient up to date with their annual eye exam?  Yes  Who is the provider or what is the name of the office in which the patient attends  annual eye exams? Dr.Miller  If pt is not established with a provider, would they like to be referred to a provider to establish care? No .   Dental Screening: Recommended annual dental exams  for proper oral hygiene  Community Resource Referral / Chronic Care Management: CRR required this visit?  No   CCM required this visit?  No      Plan:     I have personally reviewed and noted the following in the patient's chart:   Medical and social history Use of alcohol, tobacco or illicit drugs  Current medications and supplements including opioid prescriptions.  Functional ability and status Nutritional status Physical activity Advanced directives List of other physicians Hospitalizations, surgeries, and ER visits in previous 12 months Vitals Screenings to include cognitive, depression, and falls Referrals and appointments  In addition, I have reviewed and discussed with patient certain preventive protocols, quality metrics, and best practice recommendations. A written personalized care plan for preventive services as well as general preventive health recommendations were provided to patient.     March Rummage, LPN   [id number]   Nurse Notes: none

## 2021-06-05 ENCOUNTER — Ambulatory Visit (INDEPENDENT_AMBULATORY_CARE_PROVIDER_SITE_OTHER): Payer: Medicare Other | Admitting: Registered Nurse

## 2021-06-05 ENCOUNTER — Encounter: Payer: Self-pay | Admitting: Registered Nurse

## 2021-06-05 ENCOUNTER — Other Ambulatory Visit: Payer: Self-pay

## 2021-06-05 VITALS — BP 114/57 | Temp 98.3°F | Resp 18 | Ht 63.0 in | Wt 181.0 lb

## 2021-06-05 DIAGNOSIS — J22 Unspecified acute lower respiratory infection: Secondary | ICD-10-CM | POA: Diagnosis not present

## 2021-06-05 MED ORDER — PROMETHAZINE-DM 6.25-15 MG/5ML PO SYRP
5.0000 mL | ORAL_SOLUTION | Freq: Four times a day (QID) | ORAL | 0 refills | Status: DC | PRN
Start: 2021-06-05 — End: 2021-09-24

## 2021-06-05 MED ORDER — AZITHROMYCIN 250 MG PO TABS: ORAL_TABLET | ORAL | 0 refills | Status: AC

## 2021-06-05 MED ORDER — PREDNISONE 10 MG (21) PO TBPK: ORAL_TABLET | ORAL | 0 refills | Status: DC

## 2021-06-05 MED ORDER — BENZONATATE 100 MG PO CAPS: 100.0000 mg | ORAL_CAPSULE | Freq: Three times a day (TID) | ORAL | 0 refills | Status: DC | PRN

## 2021-06-05 NOTE — Patient Instructions (Addendum)
Frances Hunter -   Frances Hunter to see you!  Your lungs were sounding less than ideal - I will send a z pack and prednisone.  Promethazine-dextromethorphan for nighttime cough relief - do not take this with mucinex Ok to use tessalon three times daily for cough as needed as well  Do some deep breathing exercises.  Call Monday if things are getting worse or not getting better  Thank you  Rich     If you have lab work done today you will be contacted with your lab results within the next 2 weeks.  If you have not heard from [place] then please contact us. The fastest way to get your results is to register for My Chart.   IF you received an x-ray today, you will receive an invoice from [facility]. Please contact [facility] at [phone] with questions or concerns regarding your invoice.   IF you received labwork today, you will receive an invoice from [facility]. Please contact [facility] at [phone] with questions or concerns regarding your invoice.   Our billing staff will not be able to assist you with questions regarding bills from these companies.  You will be contacted with the lab results as soon as they are available. The fastest way to get your results is to activate your My Chart account. Instructions are located on the last page of this paperwork. If you have not heard from [place] regarding the results in 2 weeks, please contact this office.

## 2021-06-05 NOTE — Progress Notes (Signed)
Established Patient Office Visit  Subjective:  Patient ID: Frances Hunter, female    DOB: [birth year]  Age: 70 y.o. MRN: [id number]  CC:  Chief Complaint  Patient presents with   Sinus Problem    Patient states she has been having some sinus congestion for about a week and coughing up some yellow mucus, fever, She has been having some chills as well. She has been taking advil and mucinex.    HPI [employer] presents for sinus congestion  Onset 8 days ago Steady symptoms, no improvement Yellow mucus Productive cough - does feel like mucus is starting to settle in chest.  Chills, fevers.  Has taken advil and mucinex with some relief.  Denies chest pain, shob, doe, headache, sensory changes, sick contacts  Has not been vaccinated against covid  Past Medical History:  Diagnosis Date   GERD (gastroesophageal reflux disease)    Hypertension    Osteopenia    low Frax score, jan 2019   Seasonal allergies     Past Surgical History:  Procedure Laterality Date   TUBAL LIGATION      Family History  Problem Relation Age of Onset   Hypertension Father    Lymphoma Father 58       lymphoma   Diabetes Brother    Hyperlipidemia Brother    Hypertension Brother    Diabetes Maternal Grandfather    Lymphoma Paternal Grandfather    Liver disease Mother 53       cyst   Hyperlipidemia Sister    Breast cancer Neg Hx     Social History   Socioeconomic History   Marital status: Married    Spouse name: Not on file   Number of children: 3   Years of education: Not on file   Highest education level: Not on file  Occupational History   Occupation: Retired  Tobacco Use   Smoking status: Never   Smokeless tobacco: Never  Vaping Use   Vaping Use: Never used  Substance and Sexual Activity   Alcohol use: Yes    Alcohol/week: 1.0 standard drink    Types: 1 Glasses of wine per week    Comment: spec occasions   Drug use: No   Sexual activity: Yes  Other Topics Concern    Not on file  Social History Narrative   Married. College: Yes. Exercise: Yes.   Social Determinants of Health   Financial Resource Strain: Low Risk    Difficulty of Paying Living Expenses: Not hard at all  Food Insecurity: No Food Insecurity   Worried About Charity fundraiser in the Last Year: Never true   [place] in the Last Year: Never true  Transportation Needs: No Transportation Needs   Lack of Transportation (Medical): No   Lack of Transportation (Non-Medical): No  Physical Activity: Sufficiently Active   Days of Exercise per Week: 3 days   Minutes of Exercise per Session: 60 min  Stress: No Stress Concern Present   Feeling of Stress : Not at all  Social Connections: Socially Integrated   Frequency of Communication with Friends and Family: Twice a week   Frequency of Social Gatherings with Friends and Family: Twice a week   Attends Religious Services: More than 4 times per year   Active Member of [employer] or Organizations: Yes   Attends Music therapist: More than 4 times per year   Marital Status: Married  Human resources officer Violence: Not At Risk  Fear of Current or Ex-Partner: No   Emotionally Abused: No   Physically Abused: No   Sexually Abused: No    Outpatient Medications Prior to Visit  Medication Sig Dispense Refill   famotidine (PEPCID) 20 MG tablet Take 1 tablet (20 mg total) by mouth 2 (two) times daily as needed for heartburn or indigestion. 60 tablet 5   fluticasone (FLONASE) 50 MCG/ACT nasal spray Place 2 sprays into both nostrils daily. 16 g 11   lisinopril-hydrochlorothiazide (ZESTORETIC) 10-12.5 MG tablet Take 1 tablet by mouth daily. 90 tablet 1   meloxicam (MOBIC) 15 MG tablet Take 15 mg by mouth daily.     No facility-administered medications prior to visit.    No Known Allergies  ROS Review of Systems  Constitutional:  Positive for chills, fatigue and fever. Negative for activity change, appetite change, diaphoresis and  unexpected weight change.  HENT:  Positive for congestion, sinus pressure and sore throat.   Eyes: Negative.   Respiratory:  Positive for cough. Negative for apnea, choking, chest tightness, shortness of breath, wheezing and stridor.   Cardiovascular: Negative.   Gastrointestinal: Negative.   Genitourinary: Negative.   Musculoskeletal: Negative.   Skin: Negative.   Neurological: Negative.   Psychiatric/Behavioral: Negative.    All other systems reviewed and are negative.    Objective:    Physical Exam Vitals and nursing note reviewed.  Constitutional:      General: She is not in acute distress.    Appearance: Normal appearance. She is normal weight. She is not ill-appearing, toxic-appearing or diaphoretic.  HENT:     Head: Normocephalic and atraumatic.     Nose:     Right Turbinates: Not enlarged, swollen or pale.     Left Turbinates: Not enlarged, swollen or pale.     Right Sinus: Maxillary sinus tenderness and frontal sinus tenderness present.     Left Sinus: Maxillary sinus tenderness and frontal sinus tenderness present.  Cardiovascular:     Rate and Rhythm: Normal rate and regular rhythm.     Heart sounds: Normal heart sounds. No murmur heard.   No friction rub. No gallop.  Pulmonary:     Effort: Pulmonary effort is normal. No respiratory distress.     Breath sounds: No stridor. Wheezing and rhonchi present. No rales.  Chest:     Chest wall: No tenderness.  Lymphadenopathy:     Cervical: No cervical adenopathy.  Skin:    General: Skin is warm and dry.  Neurological:     General: No focal deficit present.     Mental Status: She is alert and oriented to person, place, and time. Mental status is at baseline.  Psychiatric:        Mood and Affect: Mood normal.        Behavior: Behavior normal.        Thought Content: Thought content normal.        Judgment: Judgment normal.    BP (!) 114/57   Temp 98.3 F (36.8 C) (Temporal)   Resp 18   Ht 5' 3"  (1.6 m)   Wt  181 lb (82.1 kg)   SpO2 99%   BMI 32.06 kg/m  Wt Readings from Last 3 Encounters:  06/05/21 181 lb (82.1 kg)  03/27/21 185 lb 9.6 oz (84.2 kg)  09/24/20 182 lb 12.8 oz (82.9 kg)     Health Maintenance Due  Topic Date Due   COVID-19 Vaccine (1) Never done   Zoster Vaccines- Shingrix (1 of 2)  Never done    There are no preventive care reminders to display for this patient.  Lab Results  Component Value Date   TSH 2.120 09/24/2020   Lab Results  Component Value Date   WBC 5.2 03/27/2021   HGB 13.2 03/27/2021   HCT 40.4 03/27/2021   MCV 90.3 03/27/2021   PLT 246.0 03/27/2021   Lab Results  Component Value Date   NA 141 03/27/2021   K 4.4 03/27/2021   CO2 32 03/27/2021   GLUCOSE 84 03/27/2021   BUN 18 03/27/2021   CREATININE 0.80 03/27/2021   BILITOT 0.5 03/27/2021   ALKPHOS 63 03/27/2021   AST 22 03/27/2021   ALT 22 03/27/2021   PROT 7.3 03/27/2021   ALBUMIN 4.1 03/27/2021   CALCIUM 9.6 03/27/2021   EGFR 94 09/24/2020   GFR 74.66 03/27/2021   Lab Results  Component Value Date   CHOL 145 03/27/2021   Lab Results  Component Value Date   HDL 62.90 03/27/2021   Lab Results  Component Value Date   LDLCALC 72 03/27/2021   Lab Results  Component Value Date   TRIG 52.0 03/27/2021   Lab Results  Component Value Date   CHOLHDL 2 03/27/2021   Lab Results  Component Value Date   HGBA1C 5.8 03/27/2021      Assessment & Plan:   Problem List Items Addressed This Visit   None Visit Diagnoses     Lower respiratory infection    -  Primary   Relevant Medications   azithromycin (ZITHROMAX) 250 MG tablet   predniSONE (STERAPRED UNI-PAK 21 TAB) 10 MG (21) TBPK tablet   benzonatate (TESSALON) 100 MG capsule   promethazine-dextromethorphan (PROMETHAZINE-DM) 6.25-15 MG/5ML syrup       Meds ordered this encounter  Medications   azithromycin (ZITHROMAX) 250 MG tablet    Sig: Take 2 tablets on day 1, then 1 tablet daily on days 2 through 5    Dispense:   6 tablet    Refill:  0    Order Specific Question:   Supervising Provider    Answer:   Carlota Raspberry, JEFFREY R [[id number]]   predniSONE (STERAPRED UNI-PAK 21 TAB) 10 MG (21) TBPK tablet    Sig: Take per package instructions. Do not skip doses. Finish entire supply.    Dispense:  1 each    Refill:  0    Order Specific Question:   Supervising Provider    Answer:   Carlota Raspberry, JEFFREY R [[id number]]   benzonatate (TESSALON) 100 MG capsule    Sig: Take 1 capsule (100 mg total) by mouth 3 (three) times daily as needed for cough.    Dispense:  20 capsule    Refill:  0    Order Specific Question:   Supervising Provider    Answer:   Carlota Raspberry, JEFFREY R [[id number]]   promethazine-dextromethorphan (PROMETHAZINE-DM) 6.25-15 MG/5ML syrup    Sig: Take 5 mLs by mouth 4 (four) times daily as needed for cough.    Dispense:  118 mL    Refill:  0    Order Specific Question:   Supervising Provider    Answer:   Carlota Raspberry, JEFFREY R [[id number]]    Follow-up: Return if symptoms worsen or fail to improve.   PLAN With adventitious lung sounds, will order z pack and prednisone Supportive care with tessalon and cough syrup as above Return if worsening or failing to improve ER precautions reviewed. Pt voices understanding Patient encouraged to call clinic with any questions, comments, or concerns.  Maximiano Coss, NP

## 2021-06-08 ENCOUNTER — Ambulatory Visit: Payer: Medicare Other

## 2021-06-10 NOTE — Progress Notes (Signed)
Established Patient Office Visit  Subjective:  Patient ID: Frances Hunter, female    DOB: [birth year]  Age: 70 y.o. MRN: [id number]  CC:  Chief Complaint  Patient presents with  . Follow-up    Patient states she is here for her 6 month follow up. Patient also needs TOC and discuss knee pain    HPI Frances Hunter presents for 6 mo follow up, TOC from [place] to myself.  Hypertension: Patient Currently taking: lisinopril-hctz10-12.57m po qd. Good effect. No AEs. Denies CV symptoms including: chest pain, shob, doe, headache, visual changes, fatigue, claudication, and dependent edema.   Previous readings and labs: BP Readings from Last 3 Encounters:  06/05/21 (!) 114/57  03/27/21 120/70  09/24/20 (!) 167/67   Lab Results  Component Value Date   CREATININE 0.80 03/27/2021      Past Medical History:  Diagnosis Date  . GERD (gastroesophageal reflux disease)   . Hypertension   . Osteopenia    low Frax score, jan 2019  . Seasonal allergies     Past Surgical History:  Procedure Laterality Date  . TUBAL LIGATION      Family History  Problem Relation Age of Onset  . Hypertension Father   . Lymphoma Father 546      lymphoma  . Diabetes Brother   . Hyperlipidemia Brother   . Hypertension Brother   . Diabetes Maternal Grandfather   . Lymphoma Paternal Grandfather   . Liver disease Mother 692      cyst  . Hyperlipidemia Sister   . Breast cancer Neg Hx     Social History   Socioeconomic History  . Marital status: Married    Spouse name: Not on file  . Number of children: 3  . Years of education: Not on file  . Highest education level: Not on file  Occupational History  . Occupation: Retired  Tobacco Use  . Smoking status: Never  . Smokeless tobacco: Never  Vaping Use  . Vaping Use: Never used  Substance and Sexual Activity  . Alcohol use: Yes    Alcohol/week: 1.0 standard drink    Types: 1 Glasses of wine per week    Comment: spec occasions   . Drug use: No  . Sexual activity: Yes  Other Topics Concern  . Not on file  Social History Narrative   Married. College: Yes. Exercise: Yes.   Social Determinants of Health   Financial Resource Strain: Low Risk   . Difficulty of Paying Living Expenses: Not hard at all  Food Insecurity: No Food Insecurity  . Worried About RCharity fundraiserin the Last Year: Never true  . Ran Out of Food in the Last Year: Never true  Transportation Needs: No Transportation Needs  . Lack of Transportation (Medical): No  . Lack of Transportation (Non-Medical): No  Physical Activity: Sufficiently Active  . Days of Exercise per Week: 3 days  . Minutes of Exercise per Session: 60 min  Stress: No Stress Concern Present  . Feeling of Stress : Not at all  Social Connections: Socially Integrated  . Frequency of Communication with Friends and Family: Twice a week  . Frequency of Social Gatherings with Friends and Family: Twice a week  . Attends Religious Services: More than 4 times per year  . Active Member of Clubs or Organizations: Yes  . Attends CArchivistMeetings: More than 4 times per year  . Marital Status: Married  IHuman resources officerViolence: Not  At Risk  . Fear of Current or Ex-Partner: No  . Emotionally Abused: No  . Physically Abused: No  . Sexually Abused: No    Outpatient Medications Prior to Visit  Medication Sig Dispense Refill  . famotidine (PEPCID) 20 MG tablet Take 1 tablet (20 mg total) by mouth 2 (two) times daily as needed for heartburn or indigestion. 60 tablet 5  . fluticasone (FLONASE) 50 MCG/ACT nasal spray Place 2 sprays into both nostrils daily. 16 g 11  . lisinopril-hydrochlorothiazide (ZESTORETIC) 10-12.5 MG tablet Take 1 tablet by mouth daily. 90 tablet 1   No facility-administered medications prior to visit.    No Known Allergies  ROS Review of Systems  Constitutional: Negative.   HENT: Negative.    Eyes: Negative.   Respiratory: Negative.     Cardiovascular: Negative.   Gastrointestinal: Negative.   Genitourinary: Negative.   Musculoskeletal: Negative.   Skin: Negative.   Neurological: Negative.   Psychiatric/Behavioral: Negative.    All other systems reviewed and are negative.    Objective:    Physical Exam Vitals and nursing note reviewed.  Constitutional:      General: She is not in acute distress.    Appearance: Normal appearance. She is normal weight. She is not ill-appearing, toxic-appearing or diaphoretic.  Cardiovascular:     Rate and Rhythm: Normal rate and regular rhythm.     Heart sounds: Normal heart sounds. No murmur heard.   No friction rub. No gallop.  Pulmonary:     Effort: Pulmonary effort is normal. No respiratory distress.     Breath sounds: Normal breath sounds. No stridor. No wheezing, rhonchi or rales.  Chest:     Chest wall: No tenderness.  Skin:    General: Skin is warm and dry.  Neurological:     General: No focal deficit present.     Mental Status: She is alert and oriented to person, place, and time. Mental status is at baseline.  Psychiatric:        Mood and Affect: Mood normal.        Behavior: Behavior normal.        Thought Content: Thought content normal.        Judgment: Judgment normal.    BP (!) 167/67   Pulse 85   Temp 98 F (36.7 C) (Temporal)   Resp 18   Ht 5' 3"  (1.6 m)   Wt 182 lb 12.8 oz (82.9 kg)   SpO2 98%   BMI 32.38 kg/m  Wt Readings from Last 3 Encounters:  06/05/21 181 lb (82.1 kg)  03/27/21 185 lb 9.6 oz (84.2 kg)  09/24/20 182 lb 12.8 oz (82.9 kg)     Health Maintenance Due  Topic Date Due  . COVID-19 Vaccine (1) Never done  . Zoster Vaccines- Shingrix (1 of 2) Never done    There are no preventive care reminders to display for this patient.  Lab Results  Component Value Date   TSH 2.120 09/24/2020   Lab Results  Component Value Date   WBC 5.2 03/27/2021   HGB 13.2 03/27/2021   HCT 40.4 03/27/2021   MCV 90.3 03/27/2021   PLT 246.0  03/27/2021   Lab Results  Component Value Date   NA 141 03/27/2021   K 4.4 03/27/2021   CO2 32 03/27/2021   GLUCOSE 84 03/27/2021   BUN 18 03/27/2021   CREATININE 0.80 03/27/2021   BILITOT 0.5 03/27/2021   ALKPHOS 63 03/27/2021   AST 22 03/27/2021  ALT 22 03/27/2021   PROT 7.3 03/27/2021   ALBUMIN 4.1 03/27/2021   CALCIUM 9.6 03/27/2021   EGFR 94 09/24/2020   GFR 74.66 03/27/2021   Lab Results  Component Value Date   CHOL 145 03/27/2021   Lab Results  Component Value Date   HDL 62.90 03/27/2021   Lab Results  Component Value Date   LDLCALC 72 03/27/2021   Lab Results  Component Value Date   TRIG 52.0 03/27/2021   Lab Results  Component Value Date   CHOLHDL 2 03/27/2021   Lab Results  Component Value Date   HGBA1C 5.8 03/27/2021      Assessment & Plan:   Problem List Items Addressed This Visit       Cardiovascular and Mediastinum   Essential hypertension - Primary   Relevant Orders   CBC With Differential (Completed)   Comprehensive metabolic panel (Completed)   Hemoglobin A1c (Completed)   Lipid panel (Completed)   TSH (Completed)   Vitamin D, 25-hydroxy (Completed)   Chronic venous insufficiency   Relevant Orders   CBC With Differential (Completed)   Comprehensive metabolic panel (Completed)   Hemoglobin A1c (Completed)   Lipid panel (Completed)   TSH (Completed)   Vitamin D, 25-hydroxy (Completed)     Musculoskeletal and Integument   Osteopenia   Relevant Orders   CBC With Differential (Completed)   Comprehensive metabolic panel (Completed)   Hemoglobin A1c (Completed)   Lipid panel (Completed)   TSH (Completed)   Vitamin D, 25-hydroxy (Completed)     Other   Insomnia   Relevant Orders   CBC With Differential (Completed)   Comprehensive metabolic panel (Completed)   Hemoglobin A1c (Completed)   Lipid panel (Completed)   TSH (Completed)   Vitamin D, 25-hydroxy (Completed)   Other Visit Diagnoses     History of elevated  glucose       Relevant Orders   Hemoglobin A1c (Completed)       No orders of the defined types were placed in this encounter.   Follow-up: No follow-ups on file.   PLAN Labs collected. Will follow up with the patient as warranted. HTN elevated. Pt cites white coat htn and good home readings. Continue current regimen Follow up in 6 mo, sooner with concerns Patient encouraged to call clinic with any questions, comments, or concerns.   Maximiano Coss, NP

## 2021-07-28 DIAGNOSIS — H524 Presbyopia: Secondary | ICD-10-CM | POA: Diagnosis not present

## 2021-07-28 DIAGNOSIS — H5213 Myopia, bilateral: Secondary | ICD-10-CM | POA: Diagnosis not present

## 2021-07-28 DIAGNOSIS — H25813 Combined forms of age-related cataract, bilateral: Secondary | ICD-10-CM | POA: Diagnosis not present

## 2021-07-28 DIAGNOSIS — R55 Syncope and collapse: Secondary | ICD-10-CM | POA: Diagnosis not present

## 2021-07-28 DIAGNOSIS — H60399 Other infective otitis externa, unspecified ear: Secondary | ICD-10-CM | POA: Diagnosis not present

## 2021-07-28 DIAGNOSIS — T7840XS Allergy, unspecified, sequela: Secondary | ICD-10-CM | POA: Diagnosis not present

## 2021-07-28 DIAGNOSIS — I10 Essential (primary) hypertension: Secondary | ICD-10-CM | POA: Diagnosis not present

## 2021-07-28 DIAGNOSIS — H52222 Regular astigmatism, left eye: Secondary | ICD-10-CM | POA: Diagnosis not present

## 2021-09-24 ENCOUNTER — Encounter: Payer: Self-pay | Admitting: Registered Nurse

## 2021-09-24 ENCOUNTER — Other Ambulatory Visit: Payer: Self-pay

## 2021-09-24 ENCOUNTER — Ambulatory Visit (INDEPENDENT_AMBULATORY_CARE_PROVIDER_SITE_OTHER): Payer: Medicare Other | Admitting: Registered Nurse

## 2021-09-24 VITALS — BP 128/62 | HR 88 | Temp 97.8°F | Resp 18 | Ht 63.0 in | Wt 182.2 lb

## 2021-09-24 DIAGNOSIS — I10 Essential (primary) hypertension: Secondary | ICD-10-CM | POA: Diagnosis not present

## 2021-09-24 DIAGNOSIS — Z1211 Encounter for screening for malignant neoplasm of colon: Secondary | ICD-10-CM | POA: Diagnosis not present

## 2021-09-24 DIAGNOSIS — Z0000 Encounter for general adult medical examination without abnormal findings: Secondary | ICD-10-CM

## 2021-09-24 LAB — COMPREHENSIVE METABOLIC PANEL
ALT: 15 U/L (ref 0–35)
AST: 16 U/L (ref 0–37)
Albumin: 4.1 g/dL (ref 3.5–5.2)
Alkaline Phosphatase: 61 U/L (ref 39–117)
BUN: 16 mg/dL (ref 6–23)
CO2: 32 mEq/L (ref 19–32)
Calcium: 9.4 mg/dL (ref 8.4–10.5)
Chloride: 103 mEq/L (ref 96–112)
Creatinine, Ser: 0.78 mg/dL (ref 0.40–1.20)
GFR: 76.69 mL/min (ref >60.00)
Glucose, Bld: 77 mg/dL (ref 70–99)
Potassium: 3.8 mEq/L (ref 3.5–5.1)
Sodium: 142 mEq/L (ref 135–145)
Total Bilirubin: 0.4 mg/dL (ref 0.2–1.2)
Total Protein: 7 g/dL (ref 6.0–8.3)

## 2021-09-24 LAB — LIPID PANEL
Cholesterol: 147 mg/dL (ref 0–200)
HDL: 67.1 mg/dL (ref >39.00)
LDL Cholesterol: 71 mg/dL (ref 0–99)
NonHDL: 80.25
Total CHOL/HDL Ratio: 2
Triglycerides: 46 mg/dL (ref 0.0–149.0)
VLDL: 9.2 mg/dL (ref 0.0–40.0)

## 2021-09-24 MED ORDER — LISINOPRIL-HYDROCHLOROTHIAZIDE 10-12.5 MG PO TABS: 1.0000 | ORAL_TABLET | Freq: Every day | ORAL | 1 refills | Status: DC

## 2021-09-24 NOTE — Progress Notes (Addendum)
Established Patient Office Visit  Subjective:  Patient ID: Frances Hunter, female    DOB: [birth year]  Age: 71 y.o. MRN: [id number]  CC:  Chief Complaint  Patient presents with   Follow-up    Patient states she is here for 6 month follow up on Hypertension.    HPI Frances Hunter presents for CPE, htn  Hypertension: Patient Currently taking: lisinopril-hctz 10-12.29m po qd Good effect. No AEs. Denies CV symptoms including: chest pain, shob, doe, headache, visual changes, fatigue, claudication, and dependent edema.   Previous readings and labs: BP Readings from Last 3 Encounters:  09/24/21 128/62  06/05/21 (!) 114/57  03/27/21 120/70   Lab Results  Component Value Date   CREATININE 0.80 03/27/2021    Due for colon ca screen Cologuard in past No fam hx of colon ca, no symptoms Interested in cologuard again  Past Medical History:  Diagnosis Date   GERD (gastroesophageal reflux disease)    Hypertension    Osteopenia    low Frax score, jan 2019   Seasonal allergies     Past Surgical History:  Procedure Laterality Date   TUBAL LIGATION      Family History  Problem Relation Age of Onset   Hypertension Father    Lymphoma Father 576      lymphoma   Diabetes Brother    Hyperlipidemia Brother    Hypertension Brother    Diabetes Maternal Grandfather    Lymphoma Paternal Grandfather    Liver disease Mother 638      cyst   Hyperlipidemia Sister    Breast cancer Neg Hx     Social History   Socioeconomic History   Marital status: Married    Spouse name: Not on file   Number of children: 3   Years of education: Not on file   Highest education level: Not on file  Occupational History   Occupation: Retired  Tobacco Use   Smoking status: Never   Smokeless tobacco: Never  Vaping Use   Vaping Use: Never used  Substance and Sexual Activity   Alcohol use: Yes    Alcohol/week: 1.0 standard drink    Types: 1 Glasses of wine per week    Comment: spec  occasions   Drug use: No   Sexual activity: Yes  Other Topics Concern   Not on file  Social History Narrative   Married. College: Yes. Exercise: Yes.   Social Determinants of Health   Financial Resource Strain: Low Risk    Difficulty of Paying Living Expenses: Not hard at all  Food Insecurity: No Food Insecurity   Worried About RCharity fundraiserin the Last Year: Never true   RNorth Braddockin the Last Year: Never true  Transportation Needs: No Transportation Needs   Lack of Transportation (Medical): No   Lack of Transportation (Non-Medical): No  Physical Activity: Sufficiently Active   Days of Exercise per Week: 3 days   Minutes of Exercise per Session: 60 min  Stress: No Stress Concern Present   Feeling of Stress : Not at all  Social Connections: Socially Integrated   Frequency of Communication with Friends and Family: Twice a week   Frequency of Social Gatherings with Friends and Family: Twice a week   Attends Religious Services: More than 4 times per year   Active Member of CGenu[employer]sor Organizations: Yes   Attends CMusic therapist More than 4 times per year   Marital Status: Married  Intimate Partner Violence: Not At Risk   Fear of Current or Ex-Partner: No   Emotionally Abused: No   Physically Abused: No   Sexually Abused: No    Outpatient Medications Prior to Visit  Medication Sig Dispense Refill   famotidine (PEPCID) 20 MG tablet Take 1 tablet (20 mg total) by mouth 2 (two) times daily as needed for heartburn or indigestion. 60 tablet 5   fluticasone (FLONASE) 50 MCG/ACT nasal spray Place 2 sprays into both nostrils daily. 16 g 11   meloxicam (MOBIC) 15 MG tablet Take 15 mg by mouth daily.     lisinopril-hydrochlorothiazide (ZESTORETIC) 10-12.5 MG tablet Take 1 tablet by mouth daily. 90 tablet 1   benzonatate (TESSALON) 100 MG capsule Take 1 capsule (100 mg total) by mouth 3 (three) times daily as needed for cough. (Patient not taking: Reported on  09/24/2021) 20 capsule 0   predniSONE (STERAPRED UNI-PAK 21 TAB) 10 MG (21) TBPK tablet Take per package instructions. Do not skip doses. Finish entire supply. (Patient not taking: Reported on 09/24/2021) 1 each 0   promethazine-dextromethorphan (PROMETHAZINE-DM) 6.25-15 MG/5ML syrup Take 5 mLs by mouth 4 (four) times daily as needed for cough. (Patient not taking: Reported on 09/24/2021) 118 mL 0   No facility-administered medications prior to visit.    No Known Allergies  ROS Review of Systems  Constitutional: Negative.   HENT: Negative.    Eyes: Negative.   Respiratory: Negative.    Cardiovascular: Negative.   Gastrointestinal: Negative.   Genitourinary: Negative.   Musculoskeletal: Negative.   Skin: Negative.   Neurological: Negative.   Psychiatric/Behavioral: Negative.    All other systems reviewed and are negative.    Objective:    Physical Exam Vitals and nursing note reviewed.  Constitutional:      General: She is not in acute distress.    Appearance: Normal appearance. She is not ill-appearing, toxic-appearing or diaphoretic.  HENT:     Head: Normocephalic and atraumatic.     Right Ear: Tympanic membrane, ear canal and external ear normal. There is no impacted cerumen.     Left Ear: Tympanic membrane, ear canal and external ear normal. There is no impacted cerumen.     Nose: Nose normal. No congestion or rhinorrhea.     Mouth/Throat:     Mouth: Mucous membranes are moist.     Pharynx: Oropharynx is clear. No oropharyngeal exudate or posterior oropharyngeal erythema.  Eyes:     General: No scleral icterus.       Right eye: No discharge.        Left eye: No discharge.     Extraocular Movements: Extraocular movements intact.     Conjunctiva/sclera: Conjunctivae normal.     Pupils: Pupils are equal, round, and reactive to light.  Cardiovascular:     Rate and Rhythm: Normal rate and regular rhythm.     Pulses: Normal pulses.     Heart sounds: Normal heart sounds. No  murmur heard.   No friction rub. No gallop.  Pulmonary:     Effort: Pulmonary effort is normal. No respiratory distress.     Breath sounds: Normal breath sounds. No stridor. No wheezing, rhonchi or rales.  Chest:     Chest wall: No tenderness.  Abdominal:     General: Abdomen is flat. Bowel sounds are normal. There is no distension.     Palpations: Abdomen is soft. There is no mass.     Tenderness: There is no abdominal tenderness. There is no  right CVA tenderness, left CVA tenderness, guarding or rebound.     Hernia: No hernia is present.  Musculoskeletal:        General: No swelling, tenderness, deformity or signs of injury. Normal range of motion.     Right lower leg: No edema.     Left lower leg: No edema.  Skin:    General: Skin is warm and dry.     Capillary Refill: Capillary refill takes less than 2 seconds.     Coloration: Skin is not jaundiced or pale.     Findings: No bruising, erythema, lesion or rash.  Neurological:     General: No focal deficit present.     Mental Status: She is alert and oriented to person, place, and time. Mental status is at baseline.     Cranial Nerves: No cranial nerve deficit.     Sensory: No sensory deficit.     Motor: No weakness.     Coordination: Coordination normal.     Gait: Gait normal.     Deep Tendon Reflexes: Reflexes normal.  Psychiatric:        Mood and Affect: Mood normal.        Behavior: Behavior normal.        Thought Content: Thought content normal.        Judgment: Judgment normal.    BP 128/62    Pulse 88    Temp 97.8 F (36.6 C) (Temporal)    Resp 18    Ht 5' 3"  (1.6 m)    Wt 182 lb 3.2 oz (82.6 kg)    SpO2 100%    BMI 32.28 kg/m  Wt Readings from Last 3 Encounters:  09/24/21 182 lb 3.2 oz (82.6 kg)  06/05/21 181 lb (82.1 kg)  03/27/21 185 lb 9.6 oz (84.2 kg)     Health Maintenance Due  Topic Date Due   Zoster Vaccines- Shingrix (1 of 2) Never done    There are no preventive care reminders to display for this  patient.  Lab Results  Component Value Date   TSH 2.120 09/24/2020   Lab Results  Component Value Date   WBC 5.2 03/27/2021   HGB 13.2 03/27/2021   HCT 40.4 03/27/2021   MCV 90.3 03/27/2021   PLT 246.0 03/27/2021   Lab Results  Component Value Date   NA 141 03/27/2021   K 4.4 03/27/2021   CO2 32 03/27/2021   GLUCOSE 84 03/27/2021   BUN 18 03/27/2021   CREATININE 0.80 03/27/2021   BILITOT 0.5 03/27/2021   ALKPHOS 63 03/27/2021   AST 22 03/27/2021   ALT 22 03/27/2021   PROT 7.3 03/27/2021   ALBUMIN 4.1 03/27/2021   CALCIUM 9.6 03/27/2021   EGFR 94 09/24/2020   GFR 74.66 03/27/2021   Lab Results  Component Value Date   CHOL 145 03/27/2021   Lab Results  Component Value Date   HDL 62.90 03/27/2021   Lab Results  Component Value Date   LDLCALC 72 03/27/2021   Lab Results  Component Value Date   TRIG 52.0 03/27/2021   Lab Results  Component Value Date   CHOLHDL 2 03/27/2021   Lab Results  Component Value Date   HGBA1C 5.8 03/27/2021      Assessment & Plan:   Problem List Items Addressed This Visit       Cardiovascular and Mediastinum   Essential hypertension   Relevant Medications   lisinopril-hydrochlorothiazide (ZESTORETIC) 10-12.5 MG tablet   Other Relevant Orders  Comprehensive metabolic panel   Lipid panel   Other Visit Diagnoses     Annual physical exam    -  Primary   Colon cancer screening       Relevant Orders   Cologuard       Meds ordered this encounter  Medications   lisinopril-hydrochlorothiazide (ZESTORETIC) 10-12.5 MG tablet    Sig: Take 1 tablet by mouth daily.    Dispense:  90 tablet    Refill:  1    Order Specific Question:   Supervising Provider    Answer:   Carlota Raspberry, JEFFREY R [[id number]]    Follow-up: Return in about 6 months (around 03/27/2022) for HTN.   PLAN BP well controlled. Continue current regimen Exam unremarkable Cologuard ordered Labs collected. Will follow up with the patient as warranted. Patient  encouraged to call clinic with any questions, comments, or concerns.  Maximiano Coss, NP

## 2021-09-24 NOTE — Patient Instructions (Addendum)
Ms. Leiphart - ? ?Great to see you ? ?No concerns arise on exam ? ?I have ordered cologuard ? ?Ok to take meloxicam daily. Supplement with tylenol 1000mg  every 8 hours as needed  ?Can use OTC diclofenac gel if needed - will work in place of meloxicam, best on small joints like hands.  ? ?Call if concerns arise. ? ?Can continue on 6 mo follow ups ? ?Thanks, ? ?Rich  ? ? ? ?If you have lab work done today you will be contacted with your lab results within the next 2 weeks.  If you have not heard from then please contact us. The fastest way to get your results is to register for My Chart. ? ? ?IF you received an x-ray today, you will receive an invoice from [facility]. Please contact [facility] at [phone] with questions or concerns regarding your invoice.  ? ?IF you received labwork today, you will receive an invoice from [place]. Please contact [facility] at [phone] with questions or concerns regarding your invoice.  ? ?Our billing staff will not be able to assist you with questions regarding bills from these companies. ? ?You will be contacted with the lab results as soon as they are available. The fastest way to get your results is to activate your My Chart account. Instructions are located on the last page of this paperwork. If you have not heard from [phone] regarding the results in 2 weeks, please contact this office. ?  ?  ?

## 2021-09-24 NOTE — Addendum Note (Signed)
Addended by: [name] on: 09/24/2021 11:22 AM ? ? Modules accepted: Orders, Level of Service ? ?

## 2021-10-06 DIAGNOSIS — Z1211 Encounter for screening for malignant neoplasm of colon: Secondary | ICD-10-CM | POA: Diagnosis not present

## 2021-10-14 LAB — COLOGUARD: COLOGUARD: NEGATIVE

## 2021-12-10 ENCOUNTER — Other Ambulatory Visit: Payer: Self-pay | Admitting: Family Medicine

## 2021-12-10 ENCOUNTER — Other Ambulatory Visit: Payer: Self-pay | Admitting: Registered Nurse

## 2021-12-10 DIAGNOSIS — Z1231 Encounter for screening mammogram for malignant neoplasm of breast: Secondary | ICD-10-CM

## 2021-12-15 ENCOUNTER — Ambulatory Visit
Admission: RE | Admit: 2021-12-15 | Discharge: 2021-12-15 | Disposition: A | Discharge status: Home / Self Care | Payer: Medicare Other | Source: Ambulatory Visit | Attending: Registered Nurse | Admitting: Registered Nurse

## 2021-12-15 DIAGNOSIS — Z1231 Encounter for screening mammogram for malignant neoplasm of breast: Secondary | ICD-10-CM

## 2022-02-15 ENCOUNTER — Ambulatory Visit (INDEPENDENT_AMBULATORY_CARE_PROVIDER_SITE_OTHER): Payer: Medicare Other | Admitting: Registered Nurse

## 2022-02-15 ENCOUNTER — Other Ambulatory Visit: Payer: Self-pay

## 2022-02-15 ENCOUNTER — Encounter: Payer: Self-pay | Admitting: Registered Nurse

## 2022-02-15 VITALS — BP 126/74 | HR 80 | Temp 98.1°F | Resp 18 | Ht 63.0 in | Wt 180.2 lb

## 2022-02-15 DIAGNOSIS — M79641 Pain in right hand: Secondary | ICD-10-CM | POA: Diagnosis not present

## 2022-02-15 DIAGNOSIS — M79642 Pain in left hand: Secondary | ICD-10-CM | POA: Diagnosis not present

## 2022-02-15 DIAGNOSIS — M545 Low back pain, unspecified: Secondary | ICD-10-CM | POA: Diagnosis not present

## 2022-02-15 DIAGNOSIS — G8929 Other chronic pain: Secondary | ICD-10-CM

## 2022-02-15 LAB — URINALYSIS, ROUTINE W REFLEX MICROSCOPIC
Bilirubin Urine: NEGATIVE
Ketones, ur: NEGATIVE
Leukocytes,Ua: NEGATIVE
Nitrite: NEGATIVE
Specific Gravity, Urine: 1.01 (ref 1.000–1.030)
Total Protein, Urine: NEGATIVE
Urine Glucose: NEGATIVE
Urobilinogen, UA: 0.2 (ref 0.0–1.0)
pH: 7 (ref 5.0–8.0)

## 2022-02-15 MED ORDER — DICLOFENAC SODIUM 1 % EX CREA: 4.0000 g | TOPICAL_CREAM | Freq: Four times a day (QID) | CUTANEOUS | 2 refills | Status: DC | PRN

## 2022-02-15 NOTE — Progress Notes (Signed)
   Acute Office Visit  Subjective:    Patient ID: Frances Hunter, female    DOB: 1950/07/29, 71 y.o.   MRN: [id number]  Chief Complaint  Patient presents with   Arthritis    Patient states she discuss some hand stiffness and was told it might be arthritis .    HPI Patient is in today for hand pain, right hip pain  Hand Pain  Hip Pain R side. Posterior No acute injury or trauma  Outpatient Medications Prior to Visit  Medication Sig Dispense Refill   famotidine (PEPCID) 20 MG tablet Take 1 tablet (20 mg total) by mouth 2 (two) times daily as needed for heartburn or indigestion. 60 tablet 5   fluticasone (FLONASE) 50 MCG/ACT nasal spray Place 2 sprays into both nostrils daily. 16 g 11   lisinopril-hydrochlorothiazide (ZESTORETIC) 10-12.5 MG tablet Take 1 tablet by mouth daily. 90 tablet 1   meloxicam (MOBIC) 15 MG tablet Take 15 mg by mouth daily.     No facility-administered medications prior to visit.    Review of Systems     Objective:    BP 126/74   Pulse 80   Temp 98.1 F (36.7 C) (Temporal)   Resp 18   Ht 5\' 3"  (1.6 m)   Wt 180 lb 3.2 oz (81.7 kg)   SpO2 98%   BMI 31.92 kg/m  Physical Exam  No results found for any visits on 02/15/22.      Assessment & Plan:  There are no diagnoses linked to this encounter.   No orders of the defined types were placed in this encounter.   No follow-ups on file.  02/17/22, NP

## 2022-02-15 NOTE — Patient Instructions (Addendum)
Ms. Cothern -   I recommend these providers as next PCP:  Jarold Motto, PA Jacquiline Doe, MD Glenetta Hew, MD Letta Moynahan Early, NP Jiles Prows, DNP Hetty Blend, NP Eileen Stanford, MD   I have sent referrals to rheumatology and physical therapy - they will call you to schedule.  Please call if you need anything!  Thanks,  Luan Pulling

## 2022-02-16 LAB — RHEUMATOID FACTOR: Rheumatoid fact SerPl-aCnc: <14 IU/mL (ref <14)

## 2022-03-04 ENCOUNTER — Ambulatory Visit: Payer: Federal, State, Local not specified - PPO | Admitting: Physical Therapy

## 2022-03-04 NOTE — Therapy (Signed)
OUTPATIENT PHYSICAL THERAPY THORACOLUMBAR EVALUATION   Patient Name: Frances Hunter MRN: [id number] DOB:Oct 22, 1950, 71 y.o., female Today's Date: 03/05/2022    Past Medical History:  Diagnosis Date   GERD (gastroesophageal reflux disease)    Hypertension    Osteopenia    low Frax score, jan 2019   Seasonal allergies    Past Surgical History:  Procedure Laterality Date   TUBAL LIGATION     Patient Active Problem List   Diagnosis Date Noted   Insomnia 01/02/2019   Seasonal allergies    Osteopenia    Osteopenia of thigh 07/07/2015   Chronic venous insufficiency 07/12/2014   Essential hypertension 12/10/2012   Allergic rhinitis 12/10/2012   GERD (gastroesophageal reflux disease) 12/10/2012   Varicose veins of bilateral lower extremities with pain 08/24/2008    PCP: [name], NP  REFERRING PROVIDER: [name], NP  REFERRING DIAG: M54.50,G89.29 (ICD-10-CM) - Chronic right-sided low back pain without sciatica  Rationale for Evaluation and Treatment Rehabilitation  THERAPY DIAG:  Other low back pain  Pain in right hip  Chronic pain of right knee  Muscle weakness (generalized)  ONSET DATE: Approx 3 months  SUBJECTIVE:                                                                                                                                                                                           SUBJECTIVE STATEMENT: Aches and pains of her R>L low back and hip, and also some R knee pain. 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 Pt has hip pain when sleeping at times. Able to stand for 4 hours.  PERTINENT HISTORY:  Obesity, HTN  PAIN:  Are you having pain? Yes: NPRS scale: 3-4/10 Pain location: R>L hip Pain description: ache Aggravating factors: Sleeping on side, prolonged standing Relieving factors: Breaks  Are you having pain? Yes: NPRS scale: 3-4/10 Pain location: R knee Pain description: catch Aggravating  factors: walking, prolonged sitting Relieving factors: Breaks   PRECAUTIONS: None  WEIGHT BEARING RESTRICTIONS No  FALLS:  Has patient fallen in last 6 months? No  LIVING ENVIRONMENT: Lives with: lives with their family Lives in: House/apartment Able to access and be mobile within home  OCCUPATION: Office type work part-time  PLOF: Independent  PATIENT GOALS To lessen the time   OBJECTIVE:   DIAGNOSTIC FINDINGS:  None relevant  PATIENT SURVEYS:  FOTO 72%  SCREENING FOR RED FLAGS: Bowel or bladder incontinence: No  COGNITION:  Overall cognitive status: Within functional limits for tasks assessed     SENSATION: WFL  MUSCLE LENGTH: Hamstrings: Right WNLs deg; Left WNLs deg  Thomas test: Right WNLs deg; Left WNLs deg  POSTURE: increased lumbar lordosis and out toeing  PALPATION: TTP of the R lat/upper glurteal and r greater trochanter arrea R>L. R lateral knee joint space  LUMBAR ROM:   Active  A/PROM  eval  Flexion Full  Extension Full provoked r LBP  Right lateral flexion Full  Left lateral flexion Full  Right rotation Full  Left rotation Full   (Blank rows = not tested)  LOWER EXTREMITY ROM:    Grossly WNLs Active  Right eval Left eval  Hip flexion    Hip extension    Hip abduction    Hip adduction    Hip internal rotation    Hip external rotation    Knee flexion    Knee extension    Ankle dorsiflexion    Ankle plantarflexion    Ankle inversion    Ankle eversion     (Blank rows = not tested)  LOWER EXTREMITY MMT:    MMT Right eval Left eval  Hip flexion 4+ 5  Hip extension 4 4+  Hip abduction 4 5  Hip adduction    Hip internal rotation    Hip external rotation 4 5  Knee flexion 5 5  Knee extension 5 5  Ankle dorsiflexion    Ankle plantarflexion    Ankle inversion    Ankle eversion     (Blank rows = not tested)  LUMBAR SPECIAL TESTS:  Slump test: Negative Patellar apprhension: R psoitive  FUNCTIONAL TESTS:  5 times  sit to stand: TBA 2 minute walk test: TBA  GAIT: Distance walked: 249ft Assistive device utilized: None Level of assistance: Complete Independence Comments: Out toeing R>L  TODAY'S TREATMENT  [facility] Adult PT Treatment:                                                DATE: 03/05/22 Therapeutic Exercise: - Supine Bridge  10 reps - 3 hold - Curl Up with Reach 10 reps - 3 hold - Hooklying Clamshell with Resistance 10 reps - 3 hold  PATIENT EDUCATION:  Education details: Eval findings, POC, HEP, measures to minimize static and prolonged strain, use of pillow between knee in SL sleeping Person educated: Patient Education method: Explanation, Demonstration, Tactile cues, Verbal cues, and Handouts Education comprehension: verbalized understanding, returned demonstration, verbal cues required, and tactile cues required  HOME EXERCISE PROGRAM: Access Code: [email or web address] URL: [email or web address] Date: 03/05/2022 Prepared by: Joellyn Rued  Exercises - Supine Bridge  - 1 x daily - 7 x weekly - 2-3 sets - 10 reps - 3 hold - Curl Up with Reach  - 1 x daily - 7 x weekly - 2-3 sets - 10 reps - 3 hold - Hooklying Clamshell with Resistance  - 1 x daily - 7 x weekly - 2-3 sets - 10 reps - 3 hold  ASSESSMENT:  CLINICAL IMPRESSION: Patient is a 71 y.o. female who was seen today for physical therapy evaluation and treatment for Chronic right-sided low back pain without sciatica. Pt is also experiencing aggravation of the greater trochanters R>L and of the R knee.  OBJECTIVE IMPAIRMENTS decreased activity tolerance, difficulty walking, decreased strength, obesity, and pain  ACTIVITY LIMITATIONS sitting, standing, sleeping, and locomotion level  PARTICIPATION LIMITATIONS: meal prep, shopping, community activity, and occupation  PERSONAL FACTORS Fitness, Past/current experiences, and 1 comorbidity: obesity  are also affecting patient's functional outcome.   REHAB POTENTIAL:  Excellent  CLINICAL DECISION MAKING: Stable/uncomplicated  EVALUATION COMPLEXITY: Low   GOALS:  SHORT TERM GOALS: Target date: 03/26/2022  Pt will be Ind in an initial HEP  Baseline: started Goal status: INITIAL  2.  Pt will voice understanding of measures to assist in pain reduction  Baseline: started Goal status: INITIAL   LONG TERM GOALS: Target date: 04/23/22  Pt will be Ind in a final HEP to maintain achieved LOF Baseline: started Goal status: INITIAL  2.  Pt will be able to complete trunk extension without increase in L low back/hip pain Baseline: Trunk ext is the most provocative trunk motion Goal status: INITIAL  3.  Pt will report a decrease in frequency and level of low back/hip pain to 1/10 with sleeping and daily activities Baseline: 0-3/10 Goal status: INITIAL  4.  Pt's R hip will demonstrate increase strength to 4+ to 5/5 for improved function with less pain Baseline: see flow sheets Goal status: INITIAL   PLAN: PT FREQUENCY: 2x/week  PT DURATION: 6 weeks  PLANNED INTERVENTIONS: Therapeutic exercises, Therapeutic activity, Gait training, Patient/Family education, Self Care, Joint mobilization, Aquatic Therapy, Dry Needling, Electrical stimulation, Spinal manipulation, Spinal mobilization, Cryotherapy, Moist heat, Taping, Vasopneumatic device, Traction, Ultrasound, Ionotophoresis 4mg /ml Dexamethasone, Manual therapy, and Re-evaluation.  PLAN FOR NEXT SESSION: review FOTO; progress therex as indicated; assess response to HEP; use of modalitiies, manual therapy, and TPDn as indicated.   Zandra Lajeunesse MS, PT 03/05/22 12:18 PM

## 2022-03-05 ENCOUNTER — Ambulatory Visit: Payer: Medicare Other | Attending: Registered Nurse

## 2022-03-05 ENCOUNTER — Other Ambulatory Visit: Payer: Self-pay

## 2022-03-05 DIAGNOSIS — M25551 Pain in right hip: Secondary | ICD-10-CM | POA: Insufficient documentation

## 2022-03-05 DIAGNOSIS — M6281 Muscle weakness (generalized): Secondary | ICD-10-CM | POA: Diagnosis not present

## 2022-03-05 DIAGNOSIS — M5459 Other low back pain: Secondary | ICD-10-CM | POA: Diagnosis not present

## 2022-03-05 DIAGNOSIS — G8929 Other chronic pain: Secondary | ICD-10-CM | POA: Diagnosis not present

## 2022-03-05 DIAGNOSIS — M545 Low back pain, unspecified: Secondary | ICD-10-CM | POA: Diagnosis not present

## 2022-03-05 DIAGNOSIS — M25561 Pain in right knee: Secondary | ICD-10-CM | POA: Diagnosis not present

## 2022-03-10 ENCOUNTER — Encounter: Payer: Self-pay | Admitting: Physical Therapy

## 2022-03-10 ENCOUNTER — Ambulatory Visit: Payer: Medicare Other | Admitting: Physical Therapy

## 2022-03-10 DIAGNOSIS — M25551 Pain in right hip: Secondary | ICD-10-CM

## 2022-03-10 DIAGNOSIS — M545 Low back pain, unspecified: Secondary | ICD-10-CM | POA: Diagnosis not present

## 2022-03-10 DIAGNOSIS — G8929 Other chronic pain: Secondary | ICD-10-CM | POA: Diagnosis not present

## 2022-03-10 DIAGNOSIS — M25561 Pain in right knee: Secondary | ICD-10-CM | POA: Diagnosis not present

## 2022-03-10 DIAGNOSIS — M6281 Muscle weakness (generalized): Secondary | ICD-10-CM | POA: Diagnosis not present

## 2022-03-10 DIAGNOSIS — M5459 Other low back pain: Secondary | ICD-10-CM | POA: Diagnosis not present

## 2022-03-10 NOTE — Therapy (Signed)
OUTPATIENT PHYSICAL THERAPY TREATMENT NOTE   Patient Name: Frances Hunter MRN: [id number] DOB:[birth year], 71 y.o., female Today's Date: 03/10/2022  PCP: [name], NP   REFERRING PROVIDER: [name], NP  END OF SESSION:   PT End of Session - 03/10/22 1244     Visit Number 2    Number of Visits 13    Date for PT Re-Evaluation 04/23/22    Authorization Type BCBS/FEDERAL E[employer] PPO, MEDICARE PART A AND B    PT Start Time 1243   pt arrived late   PT Stop Time 1315    PT Time Calculation (min) 32 min             Past Medical History:  Diagnosis Date   GERD (gastroesophageal reflux disease)    Hypertension    Osteopenia    low Frax score, jan 2019   Seasonal allergies    Past Surgical History:  Procedure Laterality Date   TUBAL LIGATION     Patient Active Problem List   Diagnosis Date Noted   Insomnia 01/02/2019   Seasonal allergies    Osteopenia    Osteopenia of thigh 07/07/2015   Chronic venous insufficiency 07/12/2014   Essential hypertension 12/10/2012   Allergic rhinitis 12/10/2012   GERD (gastroesophageal reflux disease) 12/10/2012   Varicose veins of bilateral lower extremities with pain 08/24/2008    REFERRING DIAG: M54.50,G89.29 (ICD-10-CM) - Chronic right-sided low back pain without sciatica   THERAPY DIAG:  Other low back pain  Pain in right hip  Chronic pain of right knee  Muscle weakness (generalized)  Rationale for Evaluation and Treatment Rehabilitation  PERTINENT HISTORY: Obesity, HTN   PRECAUTIONS: None  SUBJECTIVE: I have been doing the exercises and already can tell a difference in the hip. No hip pain today. Just a little knee pain .  PAIN:  Are you having pain? Yes: NPRS scale: 0/10 Pain location: R>L hip Pain description: ache Aggravating factors: Sleeping on side, prolonged standing Relieving factors: Breaks   Are you having pain? Yes: NPRS scale: 4/10 Pain location: R knee Pain description:  catch Aggravating factors: walking, prolonged sitting Relieving factors: Breaks   OBJECTIVE: (objective measures completed at initial evaluation unless otherwise dated)   DIAGNOSTIC FINDINGS:  None relevant   PATIENT SURVEYS:  FOTO 72%   SCREENING FOR RED FLAGS: Bowel or bladder incontinence: No   COGNITION:           Overall cognitive status: Within functional limits for tasks assessed                          SENSATION: WFL   MUSCLE LENGTH: Hamstrings: Right WNLs deg; Left WNLs deg Maisie Fus test: Right WNLs deg; Left WNLs deg   POSTURE: increased lumbar lordosis and out toeing   PALPATION: TTP of the R lat/upper glurteal and r greater trochanter arrea R>L. R lateral knee joint space   LUMBAR ROM:    Active  A/PROM  eval  Flexion Full  Extension Full provoked r LBP  Right lateral flexion Full  Left lateral flexion Full  Right rotation Full  Left rotation Full   (Blank rows = not tested)   LOWER EXTREMITY ROM:    Grossly WNLs Active  Right eval Left eval  Hip flexion      Hip extension      Hip abduction      Hip adduction      Hip internal rotation  Hip external rotation      Knee flexion      Knee extension      Ankle dorsiflexion      Ankle plantarflexion      Ankle inversion      Ankle eversion       (Blank rows = not tested)   LOWER EXTREMITY MMT:     MMT Right eval Left eval  Hip flexion 4+ 5  Hip extension 4 4+  Hip abduction 4 5  Hip adduction      Hip internal rotation      Hip external rotation 4 5  Knee flexion 5 5  Knee extension 5 5  Ankle dorsiflexion      Ankle plantarflexion      Ankle inversion      Ankle eversion       (Blank rows = not tested)   LUMBAR SPECIAL TESTS:  Slump test: Negative Patellar apprhension: R psoitive   FUNCTIONAL TESTS:  5 times sit to stand:03/10/22:  17 sec  2 minute walk test: TBA   GAIT: Distance walked: 220ft Assistive device utilized: None Level of assistance: Complete  Independence Comments: Out toeing R>L   TODAY'S TREATMENT  [facility] Adult PT Treatment:                                                DATE: 03/10/22 Therapeutic Exercise: - Hooklying Clamshell with Resistance 10 reps - 3 hold- 2 sets  - Supine Bridge  10 reps - 3 hold- 2 sets - with blue band  - Curl Up with Reach 10 reps - 3 hold - Side hip abduction 10 x 2 each  5 x STS- from bariatric chair : 17 sec STS x 10  from bariatric chair  [facility] Adult PT Treatment:                                                DATE: 03/05/22 Therapeutic Exercise: - Supine Bridge  10 reps - 3 hold - Curl Up with Reach 10 reps - 3 hold - Hooklying Clamshell with Resistance 10 reps - 3 hold   PATIENT EDUCATION:  Education details: FOTO score and prediction reviewed  Person educated: Patient Education method: Explanation, Demonstration, Tactile cues, Verbal cues, and Handouts Education comprehension: verbalized understanding, returned demonstration, verbal cues required, and tactile cues required   HOME EXERCISE PROGRAM: Access Code: [email or web address] Access Code: [email or web address] URL: [email or web address] Date: 03/10/2022 Prepared by: Jannette Spanner  Exercises - Supine Bridge  - 1 x daily - 7 x weekly - 2-3 sets - 10 reps - 3 hold - Curl Up with Reach  - 1 x daily - 7 x weekly - 2-3 sets - 10 reps - 3 hold - Hooklying Clamshell with Resistance  - 1 x daily - 7 x weekly - 2-3 sets - 10 reps - 3 hold - Sidelying Hip Abduction  - 1 x daily - 7 x weekly - 2 sets - 10 reps - Sit to stand with control  - 1 x daily - 7 x weekly - 2 sets - 10 reps - 5 hold   ASSESSMENT:   CLINICAL IMPRESSION: Patient is a 71 y.o. female who was seen today for  physical therapy treatment for Chronic right-sided low back pain without sciatica. Pt is also experiencing aggravation of the greater trochanters R>L and of the R knee. Today, her pain is only located in right knee and she reports improvement in RLBP/Hip pain. She has been  compliant with HEP. Began Nustep and reviewed HEP. Captured 5 x STS and reviewed FOTO results with patient. Progressed hip abduction strength to sidelying and added STS. She tolerated session with min increase in right knee pain. She was given an updated HEP.    OBJECTIVE IMPAIRMENTS decreased activity tolerance, difficulty walking, decreased strength, obesity, and pain   ACTIVITY LIMITATIONS sitting, standing, sleeping, and locomotion level   PARTICIPATION LIMITATIONS: meal prep, shopping, community activity, and occupation   PERSONAL FACTORS Fitness, Past/current experiences, and 1 comorbidity: obesity  are also affecting patient's functional outcome.    REHAB POTENTIAL: Excellent   CLINICAL DECISION MAKING: Stable/uncomplicated   EVALUATION COMPLEXITY: Low     GOALS:   SHORT TERM GOALS: Target date: 03/26/2022   Pt will be Ind in an initial HEP  Baseline: started Goal status: INITIAL   2.  Pt will voice understanding of measures to assist in pain reduction  Baseline: started Goal status: INITIAL     LONG TERM GOALS: Target date: 04/23/22   Pt will be Ind in a final HEP to maintain achieved LOF Baseline: started Goal status: INITIAL   2.  Pt will be able to complete trunk extension without increase in L low back/hip pain Baseline: Trunk ext is the most provocative trunk motion Goal status: INITIAL   3.  Pt will report a decrease in frequency and level of low back/hip pain to 1/10 with sleeping and daily activities Baseline: 0-3/10 Goal status: INITIAL   4.  Pt's R hip will demonstrate increase strength to 4+ to 5/5 for improved function with less pain Baseline: see flow sheets Goal status: INITIAL     PLAN: PT FREQUENCY: 2x/week   PT DURATION: 6 weeks   PLANNED INTERVENTIONS: Therapeutic exercises, Therapeutic activity, Gait training, Patient/Family education, Self Care, Joint mobilization, Aquatic Therapy, Dry Needling, Electrical stimulation, Spinal  manipulation, Spinal mobilization, Cryotherapy, Moist heat, Taping, Vasopneumatic device, Traction, Ultrasound, Ionotophoresis 4mg /ml Dexamethasone, Manual therapy, and Re-evaluation.   PLAN FOR NEXT SESSION: ; progress therex as indicated; assess response to HEP; use of modalitiies, manual therapy, and TPDn as indicated.      , PTA 03/10/22 1:25 PM Phone: [phone] Fax: [phone]

## 2022-03-12 ENCOUNTER — Ambulatory Visit: Payer: Medicare Other

## 2022-03-12 DIAGNOSIS — M5459 Other low back pain: Secondary | ICD-10-CM

## 2022-03-12 DIAGNOSIS — M25551 Pain in right hip: Secondary | ICD-10-CM | POA: Diagnosis not present

## 2022-03-12 DIAGNOSIS — G8929 Other chronic pain: Secondary | ICD-10-CM

## 2022-03-12 DIAGNOSIS — M25561 Pain in right knee: Secondary | ICD-10-CM | POA: Diagnosis not present

## 2022-03-12 DIAGNOSIS — M6281 Muscle weakness (generalized): Secondary | ICD-10-CM

## 2022-03-12 DIAGNOSIS — M545 Low back pain, unspecified: Secondary | ICD-10-CM | POA: Diagnosis not present

## 2022-03-12 NOTE — Therapy (Signed)
OUTPATIENT PHYSICAL THERAPY TREATMENT NOTE   Patient Name: Frances Hunter MRN: [id number] DOB:12-Aug-1950, 71 y.o., female Today's Date: 03/12/2022  PCP: [name], NP   REFERRING PROVIDER: [name], NP  END OF SESSION:   PT End of Session - 03/12/22 1340     Visit Number 3    Number of Visits 13    Date for PT Re-Evaluation 04/23/22    Authorization Type BCBS/F[employer]ER[employer] E[employer] PPO, MEDICARE PART A AND B    PT Start Time 1020    PT Stop Time 1105    PT Time Calculation (min) 45 min    Activity Tolerance Patient tolerated treatment well    Behavior During Therapy WFL for tasks assessed/performed              Past Medical History:  Diagnosis Date   GERD (gastroesophageal reflux disease)    Hypertension    Osteopenia    low Frax score, jan 2019   Seasonal allergies    Past Surgical History:  Procedure Laterality Date   TUBAL LIGATION     Patient Active Problem List   Diagnosis Date Noted   Insomnia 01/02/2019   Seasonal allergies    Osteopenia    Osteopenia of thigh 07/07/2015   Chronic venous insufficiency 07/12/2014   Essential hypertension 12/10/2012   Allergic rhinitis 12/10/2012   GERD (gastroesophageal reflux disease) 12/10/2012   Varicose veins of bilateral lower extremities with pain 08/24/2008    REFERRING DIAG: M54.50,G89.29 (ICD-10-CM) - Chronic right-sided low back pain without sciatica   THERAPY DIAG:  Other low back pain  Pain in right hip  Chronic pain of right knee  Muscle weakness (generalized)  Rationale for Evaluation and Treatment Rehabilitation  PERTINENT HISTORY: Obesity, HTN   PRECAUTIONS: None  SUBJECTIVE: Doing well only muscle soreness. R knee feels weak. PAIN:  Are you having pain? Yes: NPRS scale: 0/10 Pain location: R>L hip Pain description: ache Aggravating factors: Sleeping on side, prolonged standing Relieving factors: Breaks   Are you having pain? Yes: NPRS scale: 0/10 Pain location: R knee Pain  description: catch Aggravating factors: walking, prolonged sitting Relieving factors: Breaks   OBJECTIVE: (objective measures completed at initial evaluation unless otherwise dated)   DIAGNOSTIC FINDINGS:  None relevant   PATIENT SURVEYS:  FOTO 72%   SCREENING FOR RED FLAGS: Bowel or bladder incontinence: No   COGNITION:           Overall cognitive status: Within functional limits for tasks assessed                          SENSATION: WFL   MUSCLE LENGTH: Hamstrings: Right WNLs deg; Left WNLs deg Maisie Fus test: Right WNLs deg; Left WNLs deg   POSTURE: increased lumbar lordosis and out toeing   PALPATION: TTP of the R lat/upper glurteal and r greater trochanter arrea R>L. R lateral knee joint space   LUMBAR ROM:    Active  A/PROM  eval  Flexion Full  Extension Full provoked r LBP  Right lateral flexion Full  Left lateral flexion Full  Right rotation Full  Left rotation Full   (Blank rows = not tested)   LOWER EXTREMITY ROM:    Grossly WNLs Active  Right eval Left eval  Hip flexion      Hip extension      Hip abduction      Hip adduction      Hip internal rotation  Hip external rotation      Knee flexion      Knee extension      Ankle dorsiflexion      Ankle plantarflexion      Ankle inversion      Ankle eversion       (Blank rows = not tested)   LOWER EXTREMITY MMT:     MMT Right eval Left eval  Hip flexion 4+ 5  Hip extension 4 4+  Hip abduction 4 5  Hip adduction      Hip internal rotation      Hip external rotation 4 5  Knee flexion 5 5  Knee extension 5 5  Ankle dorsiflexion      Ankle plantarflexion      Ankle inversion      Ankle eversion       (Blank rows = not tested)   LUMBAR SPECIAL TESTS:  Slump test: Negative Patellar apprhension: R psoitive   FUNCTIONAL TESTS:  5 times sit to stand:03/10/22:  17 sec  2 minute walk test: TBA 03/12/22: 555   GAIT: Distance walked: 272ft Assistive device utilized: None Level of  assistance: Complete Independence Comments: Out toeing R>L   TODAY'S TREATMENT  [facility] Adult PT Treatment:                                                DATE: 03/12/22 Therapeutic Exercise: Hooklying Clamshell with Resistance 10 reps - 3 hold- 2 sets BluTB Supine Bridge  10 reps - 3 hold- 2 sets - with blue band  Curl Up with Reach 10 reps- 2 sets - 3 hold SLR c quad sets 10 x 2 each Side hip abduction 10 x 2 each  STS x 10  from mat table 2 MWT Updated HEP  [facility] Adult PT Treatment:                                                DATE: 03/10/22 Therapeutic Exercise: - Hooklying Clamshell with Resistance 10 reps - 3 hold- 2 sets BluTB - Supine Bridge  10 reps - 3 hold- 2 sets - with blue band  - Curl Up with Reach 10 reps - 3 hold - Side hip abduction 10 x 2 each  5 x STS- from bariatric chair : 17 sec STS x 10  from bariatric chair  [facility] Adult PT Treatment:                                                DATE: 03/05/22 Therapeutic Exercise: - Supine Bridge  10 reps - 3 hold - Curl Up with Reach 10 reps - 3 hold - Hooklying Clamshell with Resistance 10 reps - 3 hold   PATIENT EDUCATION:  Education details: FOTO score and prediction reviewed  Person educated: Patient Education method: Explanation, Demonstration, Tactile cues, Verbal cues, and Handouts Education comprehension: verbalized understanding, returned demonstration, verbal cues required, and tactile cues required   HOME EXERCISE PROGRAM: Access Code: [email or web address] URL: [email or web address] Date: 03/12/2022 Prepared by: Joellyn Rued  Exercises - Supine Bridge  - 1 x daily -  7 x weekly - 2-3 sets - 10 reps - 3 hold - Curl Up with Reach  - 1 x daily - 7 x weekly - 2-3 sets - 10 reps - 3 hold - Hooklying Clamshell with Resistance  - 1 x daily - 7 x weekly - 2-3 sets - 10 reps - 3 hold - Sidelying Hip Abduction  - 1 x daily - 7 x weekly - 2 sets - 10 reps - Sit to stand with control  - 1 x daily - 7 x weekly - 2  sets - 10 reps - 5 hold - Active Straight Leg Raise with Quad Set  - 1 x daily - 7 x weekly - 2 sets - 10 reps - 3 hold   ASSESSMENT:   CLINICAL IMPRESSION:to her initial PT program with improved R hip and knee pain. PT was completed for LE strengthening. was assessed with pt demonstrating an appropriate distance. Pt tolerated the session without adverse effects. Pt will benefit from skilled PT to address impairments to optimize function with less pain.   OBJECTIVE IMPAIRMENTS decreased activity tolerance, difficulty walking, decreased strength, obesity, and pain   ACTIVITY LIMITATIONS sitting, standing, sleeping, and locomotion level   PARTICIPATION LIMITATIONS: meal prep, shopping, community activity, and occupation   PERSONAL FACTORS Fitness, Past/current experiences, and 1 comorbidity: obesity  are also affecting patient's functional outcome.    REHAB POTENTIAL: Excellent   CLINICAL DECISION MAKING: Stable/uncomplicated   EVALUATION COMPLEXITY: Low     GOALS:   SHORT TERM GOALS: Target date: 03/26/2022   Pt will be Ind in an initial HEP  Baseline: started Goal status: INITIAL   2.  Pt will voice understanding of measures to assist in pain reduction  Baseline: started Goal status: INITIAL     LONG TERM GOALS: Target date: 04/23/22   Pt will be Ind in a final HEP to maintain achieved LOF Baseline: started Goal status: INITIAL   2.  Pt will be able to complete trunk extension without increase in L low back/hip pain Baseline: Trunk ext is the most provocative trunk motion Goal status: INITIAL   3.  Pt will report a decrease in frequency and level of low back/hip pain to 1/10 with sleeping and daily activities Baseline: 0-3/10 Goal status: INITIAL   4.  Pt's R hip will demonstrate increase strength to 4+ to 5/5 for improved function with less pain Baseline: see flow sheets Goal status: INITIAL     PLAN: PT FREQUENCY: 2x/week   PT DURATION: 6 weeks   PLANNED  INTERVENTIONS: Therapeutic exercises, Therapeutic activity, Gait training, Patient/Family education, Self Care, Joint mobilization, Aquatic Therapy, Dry Needling, Electrical stimulation, Spinal manipulation, Spinal mobilization, Cryotherapy, Moist heat, Taping, Vasopneumatic device, Traction, Ultrasound, Ionotophoresis 4mg /ml Dexamethasone, Manual therapy, and Re-evaluation.   PLAN FOR NEXT SESSION: ; progress therex as indicated; assess response to HEP; use of modalitiies, manual therapy, and TPDn as indicated.    Zyair Rhein MS, PT 03/12/22 1:52 PM

## 2022-03-16 NOTE — Therapy (Signed)
OUTPATIENT PHYSICAL THERAPY TREATMENT NOTE   Patient Name: CAMREIGH MICHIE MRN: [id number] DOB:05-Sep-1950, 71 y.o., female Today's Date: 03/17/2022  PCP: [name], NP   REFERRING PROVIDER: [name], NP  END OF SESSION:   PT End of Session - 03/17/22 1740     Visit Number 4    Number of Visits 13    Date for PT Re-Evaluation 04/23/22    Authorization Type BCBS/[employer] PPO, MEDICARE PART A AND B    PT Start Time 1030   15 mins late   PT Stop Time 1100    PT Time Calculation (min) 30 min    Activity Tolerance Patient tolerated treatment well    Behavior During Therapy WFL for tasks assessed/performed               Past Medical History:  Diagnosis Date   GERD (gastroesophageal reflux disease)    Hypertension    Osteopenia    low Frax score, jan 2019   Seasonal allergies    Past Surgical History:  Procedure Laterality Date   TUBAL LIGATION     Patient Active Problem List   Diagnosis Date Noted   Insomnia 01/02/2019   Seasonal allergies    Osteopenia    Osteopenia of thigh 07/07/2015   Chronic venous insufficiency 07/12/2014   Essential hypertension 12/10/2012   Allergic rhinitis 12/10/2012   GERD (gastroesophageal reflux disease) 12/10/2012   Varicose veins of bilateral lower extremities with pain 08/24/2008    REFERRING DIAG: M54.50,G89.29 (ICD-10-CM) - Chronic right-sided low back pain without sciatica   THERAPY DIAG:  Other low back pain  Pain in right hip  Chronic pain of right knee  Muscle weakness (generalized)  Rationale for Evaluation and Treatment Rehabilitation  PERTINENT HISTORY: Obesity, HTN   PRECAUTIONS: None  SUBJECTIVE: My hip is not hurting even when lying on it at night. The R knee does not hurt, but it feels weak and has an occasional catch  PAIN:  Are you having pain? Yes: NPRS scale: 0/10 Pain location: R>L hip Pain description: ache Aggravating factors: Sleeping on side, prolonged standing Relieving  factors: Breaks   Are you having pain? Yes: NPRS scale: 0/10 Pain location: R knee Pain description: catch Aggravating factors: walking, prolonged sitting Relieving factors: Breaks   OBJECTIVE: (objective measures completed at initial evaluation unless otherwise dated)   DIAGNOSTIC FINDINGS:  None relevant   PATIENT SURVEYS:  FOTO 72%   SCREENING FOR RED FLAGS: Bowel or bladder incontinence: No   COGNITION:           Overall cognitive status: Within functional limits for tasks assessed                          SENSATION: WFL   MUSCLE LENGTH: Hamstrings: Right WNLs deg; Left WNLs deg Maisie Fus test: Right WNLs deg; Left WNLs deg   POSTURE: increased lumbar lordosis and out toeing   PALPATION: TTP of the R lat/upper glurteal and r greater trochanter arrea R>L. R lateral knee joint space   LUMBAR ROM:    Active  A/PROM  eval  Flexion Full  Extension Full provoked r LBP  Right lateral flexion Full  Left lateral flexion Full  Right rotation Full  Left rotation Full   (Blank rows = not tested)   LOWER EXTREMITY ROM:    Grossly WNLs Active  Right eval Left eval  Hip flexion      Hip extension  Hip abduction      Hip adduction      Hip internal rotation      Hip external rotation      Knee flexion      Knee extension      Ankle dorsiflexion      Ankle plantarflexion      Ankle inversion      Ankle eversion       (Blank rows = not tested)   LOWER EXTREMITY MMT:     MMT Right eval Left eval  Hip flexion 4+ 5  Hip extension 4 4+  Hip abduction 4 5  Hip adduction      Hip internal rotation      Hip external rotation 4 5  Knee flexion 5 5  Knee extension 5 5  Ankle dorsiflexion      Ankle plantarflexion      Ankle inversion      Ankle eversion       (Blank rows = not tested)   LUMBAR SPECIAL TESTS:  Slump test: Negative Patellar apprhension: R psoitive   FUNCTIONAL TESTS:  5 times sit to stand:03/10/22:  17 sec  2 minute walk test: TBA  03/12/22: 555   GAIT: Distance walked: 230ft Assistive device utilized: None Level of assistance: Complete Independence Comments: Out toeing R>L   TODAY'S TREATMENT  [facility] Adult PT Treatment:                                                DATE: 03/17/22 Therapeutic Exercise: Hooklying Clamshell with Resistance 20 reps - 3 hold BluTB Supine Bridge  15 reps - 3 hold- with blue band  Curl Up with Reach 15 reps- 3 hold SLR c quad sets 10 x 2 each Side hip abduction 15 x 2 each  STS x 10  from mat table LAQ 2x10 GTB Hamstring curl 2x10 GTB Updated HEP  [facility] Adult PT Treatment:                                                DATE: 03/12/22 Therapeutic Exercise: Hooklying Clamshell with Resistance 10 reps - 3 hold- 2 sets BluTB Supine Bridge  10 reps - 3 hold- 2 sets - with blue band  Curl Up with Reach 10 reps- 2 sets - 3 hold SLR c quad sets 10 x 2 each Side hip abduction 10 x 2 each  STS x 10  from mat table 2 MWT Updated HEP  [facility] Adult PT Treatment:                                                DATE: 03/10/22 Therapeutic Exercise: - Hooklying Clamshell with Resistance 10 reps - 3 hold- 2 sets BluTB - Supine Bridge  10 reps - 3 hold- 2 sets - with blue band  - Curl Up with Reach 10 reps - 3 hold - Side hip abduction 10 x 2 each  5 x STS- from bariatric chair : 17 sec STS x 10  from bariatric chair  [facility] Adult [facility] Treatment:  DATE: 03/05/22 Therapeutic Exercise: - Supine Bridge  10 reps - 3 hold - Curl Up with Reach 10 reps - 3 hold - Hooklying Clamshell with Resistance 10 reps - 3 hold   PATIENT EDUCATION:  Education details: FOTO score and prediction reviewed  Person educated: Patient Education method: Explanation, Demonstration, Tactile cues, Verbal cues, and Handouts Education comprehension: verbalized understanding, returned demonstration, verbal cues required, and tactile cues required   HOME EXERCISE PROGRAM: Access Code:  [email or web address] URL: [email or web address] Date: 03/17/2022 Prepared by: Joellyn Rued  Exercises - Supine Bridge  - 1 x daily - 7 x weekly - 2-3 sets - 10 reps - 3 hold - Curl Up with Reach  - 1 x daily - 7 x weekly - 2-3 sets - 10 reps - 3 hold - Hooklying Clamshell with Resistance  - 1 x daily - 7 x weekly - 2-3 sets - 10 reps - 3 hold - Sidelying Hip Abduction  - 1 x daily - 7 x weekly - 2 sets - 10 reps - Sit to stand with control  - 1 x daily - 7 x weekly - 2 sets - 10 reps - 5 hold - Active Straight Leg Raise with Quad Set  - 1 x daily - 7 x weekly - 2 sets - 10 reps - 3 hold - Sitting Knee Extension with Resistance  - 1 x daily - 7 x weekly - 2 sets - 10 reps - 3 hold - Seated Hamstring Curl with Anchored Resistance  - 1 x daily - 7 x weekly - 2 sets - 10 reps - 3 hold   ASSESSMENT:   CLINICAL IMPRESSION: PT was completed for R knee/LE strengthening. Therex was initiated for LAQ and hamstrings curls. These exs were added to the pt's HEP. Pt tolerated today's prescribed therex without adverse effects. Pt' report indicates a good response to PT with improved hip and knee pain.   OBJECTIVE IMPAIRMENTS decreased activity tolerance, difficulty walking, decreased strength, obesity, and pain   ACTIVITY LIMITATIONS sitting, standing, sleeping, and locomotion level   PARTICIPATION LIMITATIONS: meal prep, shopping, community activity, and occupation   PERSONAL FACTORS Fitness, Past/current experiences, and 1 comorbidity: obesity  are also affecting patient's functional outcome.    REHAB POTENTIAL: Excellent   CLINICAL DECISION MAKING: Stable/uncomplicated   EVALUATION COMPLEXITY: Low     GOALS:   SHORT TERM GOALS: Target date: 03/26/2022   Pt will be Ind in an initial HEP  Baseline: started Goal status: INITIAL   2.  Pt will voice understanding of measures to assist in pain reduction  Baseline: started Goal status: INITIAL     LONG TERM GOALS: Target date: 04/23/22    Pt will be Ind in a final HEP to maintain achieved LOF Baseline: started Goal status: INITIAL   2.  Pt will be able to complete trunk extension without increase in L low back/hip pain Baseline: Trunk ext is the most provocative trunk motion Goal status: INITIAL   3.  Pt will report a decrease in frequency and level of low back/hip pain to 1/10 with sleeping and daily activities Baseline: 0-3/10 Goal status: INITIAL   4.  Pt's R hip will demonstrate increase strength to 4+ to 5/5 for improved function with less pain Baseline: see flow sheets Goal status: INITIAL     PLAN: PT FREQUENCY: 2x/week   PT DURATION: 6 weeks   PLANNED INTERVENTIONS: Therapeutic exercises, Therapeutic activity, Gait training, Patient/Family education, Self Care, Joint mobilization, Aquatic Therapy, Dry Needling,  Electrical stimulation, Spinal manipulation, Spinal mobilization, Cryotherapy, Moist heat, Taping, Vasopneumatic device, Traction, Ultrasound, Ionotophoresis 4mg /ml Dexamethasone, Manual therapy, and Re-evaluation.   PLAN FOR NEXT SESSION: ; progress therex as indicated; assess response to HEP; use of modalitiies, manual therapy, and TPDn as indicated. Assess STGs.    Jarren Para MS, PT 03/17/22 5:47 PM

## 2022-03-17 ENCOUNTER — Ambulatory Visit: Payer: Medicare Other

## 2022-03-17 DIAGNOSIS — M25551 Pain in right hip: Secondary | ICD-10-CM | POA: Diagnosis not present

## 2022-03-17 DIAGNOSIS — G8929 Other chronic pain: Secondary | ICD-10-CM | POA: Diagnosis not present

## 2022-03-17 DIAGNOSIS — M6281 Muscle weakness (generalized): Secondary | ICD-10-CM

## 2022-03-17 DIAGNOSIS — M545 Low back pain, unspecified: Secondary | ICD-10-CM | POA: Diagnosis not present

## 2022-03-17 DIAGNOSIS — M25561 Pain in right knee: Secondary | ICD-10-CM | POA: Diagnosis not present

## 2022-03-17 DIAGNOSIS — M5459 Other low back pain: Secondary | ICD-10-CM

## 2022-03-19 ENCOUNTER — Encounter: Payer: Self-pay | Admitting: Physical Therapy

## 2022-03-19 ENCOUNTER — Ambulatory Visit: Payer: Medicare Other | Admitting: Physical Therapy

## 2022-03-19 DIAGNOSIS — M25551 Pain in right hip: Secondary | ICD-10-CM

## 2022-03-19 DIAGNOSIS — M5459 Other low back pain: Secondary | ICD-10-CM | POA: Diagnosis not present

## 2022-03-19 DIAGNOSIS — M25561 Pain in right knee: Secondary | ICD-10-CM | POA: Diagnosis not present

## 2022-03-19 DIAGNOSIS — M545 Low back pain, unspecified: Secondary | ICD-10-CM | POA: Diagnosis not present

## 2022-03-19 DIAGNOSIS — G8929 Other chronic pain: Secondary | ICD-10-CM | POA: Diagnosis not present

## 2022-03-19 DIAGNOSIS — M6281 Muscle weakness (generalized): Secondary | ICD-10-CM | POA: Diagnosis not present

## 2022-03-19 NOTE — Therapy (Signed)
OUTPATIENT PHYSICAL THERAPY TREATMENT NOTE   Patient Name: Frances Hunter MRN: [id number] DOB:13-Jan-1951, 71 y.o., female Today's Date: 03/19/2022  PCP: Maximiano Coss, NP   REFERRING PROVIDER: Maximiano Coss, NP  END OF SESSION:   PT End of Session - 03/19/22 1026     Visit Number 5    Number of Visits 13    Date for PT Re-Evaluation 04/23/22    Authorization Type BCBS/[employer]ER[employer] E[employer] PPO, MEDICARE PART A AND B    PT Start Time 1023    PT Stop Time 1101    PT Time Calculation (min) 38 min               Past Medical History:  Diagnosis Date   GERD (gastroesophageal reflux disease)    Hypertension    Osteopenia    low Frax score, jan 2019   Seasonal allergies    Past Surgical History:  Procedure Laterality Date   TUBAL LIGATION     Patient Active Problem List   Diagnosis Date Noted   Insomnia 01/02/2019   Seasonal allergies    Osteopenia    Osteopenia of thigh 07/07/2015   Chronic venous insufficiency 07/12/2014   Essential hypertension 12/10/2012   Allergic rhinitis 12/10/2012   GERD (gastroesophageal reflux disease) 12/10/2012   Varicose veins of bilateral lower extremities with pain 08/24/2008    REFERRING DIAG: M54.50,G89.29 (ICD-10-CM) - Chronic right-sided low back pain without sciatica   THERAPY DIAG:  Other low back pain  Pain in right hip  Rationale for Evaluation and Treatment Rehabilitation  PERTINENT HISTORY: Obesity, HTN   PRECAUTIONS: None  SUBJECTIVE: No pain at all in hips , just the knee is weak and has an occasional catch  PAIN:  Are you having pain? Yes: NPRS scale: 0/10 Pain location: R>L hip Pain description: ache Aggravating factors: Sleeping on side, prolonged standing Relieving factors: Breaks   Are you having pain? Yes: NPRS scale: 0/10 Pain location: R knee Pain description: catch Aggravating factors: walking, prolonged sitting Relieving factors: Breaks   OBJECTIVE: (objective measures completed at initial  evaluation unless otherwise dated)   DIAGNOSTIC FINDINGS:  None relevant   PATIENT SURVEYS:  FOTO 72%   SCREENING FOR RED FLAGS: Bowel or bladder incontinence: No   COGNITION:           Overall cognitive status: Within functional limits for tasks assessed                          SENSATION: WFL   MUSCLE LENGTH: Hamstrings: Right WNLs deg; Left WNLs deg Marcello Moores test: Right WNLs deg; Left WNLs deg   POSTURE: increased lumbar lordosis and out toeing   PALPATION: TTP of the R lat/upper glurteal and r greater trochanter arrea R>L. R lateral knee joint space   LUMBAR ROM:    Active  A/PROM  eval AROM 03/19/22  Flexion Full   Extension Full provoked r LBP Full no back pain  Right lateral flexion Full   Left lateral flexion Full   Right rotation Full   Left rotation Full    (Blank rows = not tested)   LOWER EXTREMITY ROM:    Grossly WNLs Active  Right eval Left eval  Hip flexion      Hip extension      Hip abduction      Hip adduction      Hip internal rotation      Hip external rotation  Knee flexion      Knee extension      Ankle dorsiflexion      Ankle plantarflexion      Ankle inversion      Ankle eversion       (Blank rows = not tested)   LOWER EXTREMITY MMT:     MMT Right eval Left eval  Hip flexion 4+ 5  Hip extension 4 4+  Hip abduction 4 5  Hip adduction      Hip internal rotation      Hip external rotation 4 5  Knee flexion 5 5  Knee extension 5 5  Ankle dorsiflexion      Ankle plantarflexion      Ankle inversion      Ankle eversion       (Blank rows = not tested)   LUMBAR SPECIAL TESTS:  Slump test: Negative Patellar apprhension: R psoitive   FUNCTIONAL TESTS:  5 times sit to stand:03/10/22:  17 sec  2 minute walk test: TBA 03/12/22: 555   GAIT: Distance walked: 259f Assistive device utilized: None Level of assistance: Complete Independence Comments: Out toeing R>L   TODAY'S TREATMENT  [facility] Adult PT Treatment:                                                 DATE: 03/19/22 Therapeutic Exercise: R QS 5 sec x 10 Bilat  SLR 10 x 2  Bridge with ball squeeze 10 x 2  Side hip abduction 15 x 2 each  STS 2 x 10  from bariatric chair  LAQ 2x10 GTB Hamstring curl 2x10 GTB Nustep L5 UE/LE x 5 minutes  [facility] Adult PT Treatment:                                                DATE: 03/17/22 Therapeutic Exercise: Hooklying Clamshell with Resistance 20 reps - 3 hold BluTB Supine Bridge  15 reps - 3 hold- with blue band  Curl Up with Reach 15 reps- 3 hold SLR c quad sets 10 x 2 each Side hip abduction 15 x 2 each  STS x 10  from mat table LAQ 2x10 GTB Hamstring curl 2x10 GTB Updated HEP  [facility] Adult PT Treatment:                                                DATE: 03/12/22 Therapeutic Exercise: Hooklying Clamshell with Resistance 10 reps - 3 hold- 2 sets BluTB Supine Bridge  10 reps - 3 hold- 2 sets - with blue band  Curl Up with Reach 10 reps- 2 sets - 3 hold SLR c quad sets 10 x 2 each Side hip abduction 10 x 2 each  STS x 10  from mat table 2 MWT Updated HEP  [facility] Adult PT Treatment:                                                DATE:  03/10/22 Therapeutic Exercise: - Hooklying Clamshell with Resistance 10 reps - 3 hold- 2 sets BluTB - Supine Bridge  10 reps - 3 hold- 2 sets - with blue band  - Curl Up with Reach 10 reps - 3 hold - Side hip abduction 10 x 2 each  5 x STS- from bariatric chair : 17 sec STS x 10  from bariatric chair  [facility] Adult PT Treatment:                                                DATE: 03/05/22 Therapeutic Exercise: - Supine Bridge  10 reps - 3 hold - Curl Up with Reach 10 reps - 3 hold - Hooklying Clamshell with Resistance 10 reps - 3 hold   PATIENT EDUCATION:  Education details: FOTO score and prediction reviewed  Person educated: Patient Education method: Explanation, Demonstration, Tactile cues, Verbal cues, and Handouts Education comprehension: verbalized understanding,  returned demonstration, verbal cues required, and tactile cues required   HOME EXERCISE PROGRAM: Access Code: [email or web address] URL: [email or web address] Date: 03/17/2022 Prepared by: Gar Ponto  Exercises - Supine Bridge  - 1 x daily - 7 x weekly - 2-3 sets - 10 reps - 3 hold - Curl Up with Reach  - 1 x daily - 7 x weekly - 2-3 sets - 10 reps - 3 hold - Hooklying Clamshell with Resistance  - 1 x daily - 7 x weekly - 2-3 sets - 10 reps - 3 hold - Sidelying Hip Abduction  - 1 x daily - 7 x weekly - 2 sets - 10 reps - Sit to stand with control  - 1 x daily - 7 x weekly - 2 sets - 10 reps - 5 hold - Active Straight Leg Raise with Quad Set  - 1 x daily - 7 x weekly - 2 sets - 10 reps - 3 hold - Sitting Knee Extension with Resistance  - 1 x daily - 7 x weekly - 2 sets - 10 reps - 3 hold - Seated Hamstring Curl with Anchored Resistance  - 1 x daily - 7 x weekly - 2 sets - 10 reps - 3 hold   ASSESSMENT:   CLINICAL IMPRESSION: PT was completed for R knee/LE strengthening. Pt demonstrates full lumbar ext without pain. She is independent with Initial HEP and can verbalize pain management strategies. She also reports no pain in hip or LB with sleep.  All STGS met and LTG# 2 , #3 Met. Reviewed newest additions to HEP and continued progression toward remaining LTGs.   Pt tolerated today's prescribed therex without adverse effects. Will recheck FOTO next session    OBJECTIVE IMPAIRMENTS decreased activity tolerance, difficulty walking, decreased strength, obesity, and pain   ACTIVITY LIMITATIONS sitting, standing, sleeping, and locomotion level   PARTICIPATION LIMITATIONS: meal prep, shopping, community activity, and occupation   [place], Past/current experiences, and 1 comorbidity: obesity  are also affecting patient's functional outcome.    REHAB POTENTIAL: Excellent   CLINICAL DECISION MAKING: Stable/uncomplicated   EVALUATION COMPLEXITY: Low     GOALS:   SHORT  TERM GOALS: Target date: 03/26/2022   Pt will be Ind in an initial HEP  Baseline: started Goal status: 03/19/22: MET   2.  Pt will voice understanding of measures to assist in pain reduction  Baseline: started Status: 03/19/22: Pt reports the  HEP is helpful for her pain as well as muscle cream Goal status: MET     LONG TERM GOALS: Target date: 04/23/22   Pt will be Ind in a final HEP to maintain achieved LOF Baseline: started Goal status: INITIAL   2.  Pt will be able to complete trunk extension without increase in L low back/hip pain Baseline: Trunk ext is the most provocative trunk motion Status: 03/19/22: Full lumbar ext without pain Goal status: MET 03/19/22   3.  Pt will report a decrease in frequency and level of low back/hip pain to 1/10 with sleeping and daily activities Baseline: 0-3/10 Status: 03/19/22: no pain with sleep since first week of PT Goal status: MET   4.  Pt's R hip will demonstrate increase strength to 4+ to 5/5 for improved function with less pain Baseline: see flow sheets Goal status: INITIAL     PLAN: PT FREQUENCY: 2x/week   PT DURATION: 6 weeks   PLANNED INTERVENTIONS: Therapeutic exercises, Therapeutic activity, Gait training, Patient/Family education, Self Care, Joint mobilization, Aquatic Therapy, Dry Needling, Electrical stimulation, Spinal manipulation, Spinal mobilization, Cryotherapy, Moist heat, Taping, Vasopneumatic device, Traction, Ultrasound, Ionotophoresis 70m/ml Dexamethasone, Manual therapy, and Re-evaluation.   PLAN FOR NEXT SESSION: ; progress therex as indicated; assess response to HEP; use of modalitiies, manual therapy, and TPDn as indicated. .Marland KitchenFLandis Gandy PTA 03/19/22 10:57 AM Phone: [phone]: [phone]

## 2022-03-24 ENCOUNTER — Ambulatory Visit: Payer: Medicare Other | Admitting: Physical Therapy

## 2022-03-24 ENCOUNTER — Encounter: Payer: Self-pay | Admitting: Physical Therapy

## 2022-03-24 DIAGNOSIS — M25551 Pain in right hip: Secondary | ICD-10-CM | POA: Diagnosis not present

## 2022-03-24 DIAGNOSIS — G8929 Other chronic pain: Secondary | ICD-10-CM | POA: Diagnosis not present

## 2022-03-24 DIAGNOSIS — M5459 Other low back pain: Secondary | ICD-10-CM | POA: Diagnosis not present

## 2022-03-24 DIAGNOSIS — M6281 Muscle weakness (generalized): Secondary | ICD-10-CM | POA: Diagnosis not present

## 2022-03-24 DIAGNOSIS — M545 Low back pain, unspecified: Secondary | ICD-10-CM | POA: Diagnosis not present

## 2022-03-24 DIAGNOSIS — M25561 Pain in right knee: Secondary | ICD-10-CM | POA: Diagnosis not present

## 2022-03-24 NOTE — Therapy (Signed)
OUTPATIENT PHYSICAL THERAPY TREATMENT NOTE   Patient Name: Frances Hunter MRN: [id number] DOB:Aug 15, 1950, 71 y.o., female Today's Date: 03/24/2022  PCP: Maximiano Coss, NP   REFERRING PROVIDER: Maximiano Coss, NP  END OF SESSION:   PT End of Session - 03/24/22 1026     Visit Number 6    Number of Visits 13    Date for PT Re-Evaluation 04/23/22    Authorization Type BCBS/[employer] PPO, MEDICARE PART A AND B    PT Start Time 1023    PT Stop Time 1101    PT Time Calculation (min) 38 min               Past Medical History:  Diagnosis Date   GERD (gastroesophageal reflux disease)    Hypertension    Osteopenia    low Frax score, jan 2019   Seasonal allergies    Past Surgical History:  Procedure Laterality Date   TUBAL LIGATION     Patient Active Problem List   Diagnosis Date Noted   Insomnia 01/02/2019   Seasonal allergies    Osteopenia    Osteopenia of thigh 07/07/2015   Chronic venous insufficiency 07/12/2014   Essential hypertension 12/10/2012   Allergic rhinitis 12/10/2012   GERD (gastroesophageal reflux disease) 12/10/2012   Varicose veins of bilateral lower extremities with pain 08/24/2008    REFERRING DIAG: M54.50,G89.29 (ICD-10-CM) - Chronic right-sided low back pain without sciatica   THERAPY DIAG:  Other low back pain  Pain in right hip  Chronic pain of right knee  Muscle weakness (generalized)  Rationale for Evaluation and Treatment Rehabilitation  PERTINENT HISTORY: Obesity, HTN   PRECAUTIONS: None  SUBJECTIVE: Still a little weak on the right side. Otherwise no pain in back or hips.   PAIN:  Are you having pain? Yes: NPRS scale: 0/10 Pain location: R>L hip Pain description: ache Aggravating factors: Sleeping on side, prolonged standing Relieving factors: Breaks   Are you having pain? Yes: NPRS scale: 0/10 Pain location: R knee Pain description: catch Aggravating factors: walking, prolonged sitting Relieving factors:  Breaks   OBJECTIVE: (objective measures completed at initial evaluation unless otherwise dated)   DIAGNOSTIC FINDINGS:  None relevant   PATIENT SURVEYS:  FOTO 72% FOTO 74% 6th visit   SCREENING FOR RED FLAGS: Bowel or bladder incontinence: No   COGNITION:           Overall cognitive status: Within functional limits for tasks assessed                          SENSATION: WFL   MUSCLE LENGTH: Hamstrings: Right WNLs deg; Left WNLs deg Marcello Moores test: Right WNLs deg; Left WNLs deg   POSTURE: increased lumbar lordosis and out toeing   PALPATION: TTP of the R lat/upper glurteal and r greater trochanter arrea R>L. R lateral knee joint space   LUMBAR ROM:    Active  A/PROM  eval AROM 03/19/22  Flexion Full   Extension Full provoked r LBP Full no back pain  Right lateral flexion Full   Left lateral flexion Full   Right rotation Full   Left rotation Full    (Blank rows = not tested)   LOWER EXTREMITY ROM:    Grossly WNLs Active  Right eval Left eval  Hip flexion      Hip extension      Hip abduction      Hip adduction      Hip internal rotation  Hip external rotation      Knee flexion      Knee extension      Ankle dorsiflexion      Ankle plantarflexion      Ankle inversion      Ankle eversion       (Blank rows = not tested)   LOWER EXTREMITY MMT:     MMT Right eval Left eval  Hip flexion 4+ 5  Hip extension 4 4+  Hip abduction 4 5  Hip adduction      Hip internal rotation      Hip external rotation 4 5  Knee flexion 5 5  Knee extension 5 5  Ankle dorsiflexion      Ankle plantarflexion      Ankle inversion      Ankle eversion       (Blank rows = not tested)   LUMBAR SPECIAL TESTS:  Slump test: Negative Patellar apprhension: R psoitive   FUNCTIONAL TESTS:  5 times sit to stand:03/10/22:  17 sec  2 minute walk test: TBA 03/12/22: 555   GAIT: Distance walked: 23f Assistive device utilized: None Level of assistance: Complete  Independence Comments: Out toeing R>L   TODAY'S TREATMENT  [facility] Adult PT Treatment:                                                DATE: 03/24/22 Therapeutic Exercise: Nustep L5 UE/LE x 5 minutes 6 inch step up 10 x 2 each Bilat heel raises 10 x 2  SLS 10 sec right, 20 sec L  STS 2 x 10  from bariatric chair  Knee ext bilat 10# OMEGA 10 x 2 Knee flex bilat 20# OMEGA 10 x 2  Side hip abduction 15 x 2 each  Bilat  SLR 10 x 2  Bridge with ball squeeze 10 x 2   [facility] Adult PT Treatment:                                                DATE: 03/19/22 Therapeutic Exercise: R QS 5 sec x 10 Bilat  SLR 10 x 2  Bridge with ball squeeze 10 x 2  Side hip abduction 15 x 2 each  STS 2 x 10  from bariatric chair  LAQ 2x10 GTB Hamstring curl 2x10 GTB Nustep L5 UE/LE x 5 minutes  [facility] Adult PT Treatment:                                                DATE: 03/17/22 Therapeutic Exercise: Hooklying Clamshell with Resistance 20 reps - 3 hold BluTB Supine Bridge  15 reps - 3 hold- with blue band  Curl Up with Reach 15 reps- 3 hold SLR c quad sets 10 x 2 each Side hip abduction 15 x 2 each  STS x 10  from mat table LAQ 2x10 GTB Hamstring curl 2x10 GTB Updated HEP  [facility] Adult PT Treatment:  DATE: 03/12/22 Therapeutic Exercise: Hooklying Clamshell with Resistance 10 reps - 3 hold- 2 sets BluTB Supine Bridge  10 reps - 3 hold- 2 sets - with blue band  Curl Up with Reach 10 reps- 2 sets - 3 hold SLR c quad sets 10 x 2 each Side hip abduction 10 x 2 each  STS x 10  from mat table 2 MWT Updated HEP  [facility] Adult PT Treatment:                                                DATE: 03/10/22 Therapeutic Exercise: - Hooklying Clamshell with Resistance 10 reps - 3 hold- 2 sets BluTB - Supine Bridge  10 reps - 3 hold- 2 sets - with blue band  - Curl Up with Reach 10 reps - 3 hold - Side hip abduction 10 x 2 each  5 x STS- from bariatric chair : 17 sec STS x  10  from bariatric chair  [facility] Adult PT Treatment:                                                DATE: 03/05/22 Therapeutic Exercise: - Supine Bridge  10 reps - 3 hold - Curl Up with Reach 10 reps - 3 hold - Hooklying Clamshell with Resistance 10 reps - 3 hold   PATIENT EDUCATION:  Education details: FOTO score and prediction reviewed  Person educated: Patient Education method: Explanation, Demonstration, Tactile cues, Verbal cues, and Handouts Education comprehension: verbalized understanding, returned demonstration, verbal cues required, and tactile cues required   HOME EXERCISE PROGRAM: Access Code: [email or web address] URL: [email or web address] Date: 03/17/2022 Prepared by: Gar Ponto  Exercises - Supine Bridge  - 1 x daily - 7 x weekly - 2-3 sets - 10 reps - 3 hold - Curl Up with Reach  - 1 x daily - 7 x weekly - 2-3 sets - 10 reps - 3 hold - Hooklying Clamshell with Resistance  - 1 x daily - 7 x weekly - 2-3 sets - 10 reps - 3 hold - Sidelying Hip Abduction  - 1 x daily - 7 x weekly - 2 sets - 10 reps - Sit to stand with control  - 1 x daily - 7 x weekly - 2 sets - 10 reps - 5 hold - Active Straight Leg Raise with Quad Set  - 1 x daily - 7 x weekly - 2 sets - 10 reps - 3 hold - Sitting Knee Extension with Resistance  - 1 x daily - 7 x weekly - 2 sets - 10 reps - 3 hold - Seated Hamstring Curl with Anchored Resistance  - 1 x daily - 7 x weekly - 2 sets - 10 reps - 3 hold   ASSESSMENT:   CLINICAL IMPRESSION: PT was completed for R knee/LE strengthening. FOTO score imoroved, not yet at target. Overall she is doing well without c/o back or hip pain lately. She does have RLE weakness as compared to LLE and would like to focus remaining POC on strengthening. Progressed with increased closed chain therex today with good tolerance. She will likely reduce to 1 x per week for remaining POC.   Pt demonstrates full lumbar ext without pain. She  is independent with Initial HEP and can  verbalize pain management strategies. She also reports no pain in hip or LB with sleep.  All STGS met and LTG# 2 , #3 Met. Reviewed newest additions to HEP and continued progression toward remaining LTGs.   Pt tolerated today's prescribed therex without adverse effects. Will recheck FOTO next session    OBJECTIVE IMPAIRMENTS decreased activity tolerance, difficulty walking, decreased strength, obesity, and pain   ACTIVITY LIMITATIONS sitting, standing, sleeping, and locomotion level   PARTICIPATION LIMITATIONS: meal prep, shopping, community activity, and occupation   [place], Past/current experiences, and 1 comorbidity: obesity  are also affecting patient's functional outcome.    REHAB POTENTIAL: Excellent   CLINICAL DECISION MAKING: Stable/uncomplicated   EVALUATION COMPLEXITY: Low     GOALS:   SHORT TERM GOALS: Target date: 03/26/2022   Pt will be Ind in an initial HEP  Baseline: started Goal status: 03/19/22: MET   2.  Pt will voice understanding of measures to assist in pain reduction  Baseline: started Status: 03/19/22: Pt reports the HEP is helpful for her pain as well as muscle cream Goal status: MET     LONG TERM GOALS: Target date: 04/23/22   Pt will be Ind in a final HEP to maintain achieved LOF Baseline: started Goal status: ONGOING   2.  Pt will be able to complete trunk extension without increase in L low back/hip pain Baseline: Trunk ext is the most provocative trunk motion Status: 03/19/22: Full lumbar ext without pain Goal status: MET 03/19/22   3.  Pt will report a decrease in frequency and level of low back/hip pain to 1/10 with sleeping and daily activities Baseline: 0-3/10 Status: 03/19/22: no pain with sleep since first week of PT Goal status: MET   4.  Pt's R hip will demonstrate increase strength to 4+ to 5/5 for improved function with less pain Baseline: see flow sheets Goal status: INITIAL     PLAN: PT FREQUENCY: 2x/week   PT  DURATION: 6 weeks   PLANNED INTERVENTIONS: Therapeutic exercises, Therapeutic activity, Gait training, Patient/Family education, Self Care, Joint mobilization, Aquatic Therapy, Dry Needling, Electrical stimulation, Spinal manipulation, Spinal mobilization, Cryotherapy, Moist heat, Taping, Vasopneumatic device, Traction, Ultrasound, Ionotophoresis 8m/ml Dexamethasone, Manual therapy, and Re-evaluation.   PLAN FOR NEXT SESSION: ; progress therex as indicated; assess response to HEP; use of modalitiies, manual therapy, and TPDn as indicated. Recheck FOTO at [employer] [facility]08/30/23 10:54 AM Phone: [phone]: [phone]

## 2022-03-25 NOTE — Therapy (Signed)
OUTPATIENT PHYSICAL THERAPY TREATMENT NOTE   Patient Name: Frances Hunter MRN: [id number] DOB:[birth year], 71 y.o., female Today's Date: 03/26/2022  PCP: Maximiano Coss, NP   REFERRING PROVIDER: Maximiano Coss, NP  END OF SESSION:   PT End of Session - 03/26/22 1030     Visit Number 7    Number of Visits 13    Date for PT Re-Evaluation 04/23/22    Authorization Type BCBS/F[employer]ERAL EMP PPO, MEDICARE PART A AND B    PT Start Time 1023    PT Stop Time 1102    PT Time Calculation (min) 39 min    Activity Tolerance Patient tolerated treatment well    Behavior During Therapy WFL for tasks assessed/performed                Past Medical History:  Diagnosis Date   GERD (gastroesophageal reflux disease)    Hypertension    Osteopenia    low Frax score, jan 2019   Seasonal allergies    Past Surgical History:  Procedure Laterality Date   TUBAL LIGATION     Patient Active Problem List   Diagnosis Date Noted   Insomnia 01/02/2019   Seasonal allergies    Osteopenia    Osteopenia of thigh 07/07/2015   Chronic venous insufficiency 07/12/2014   Essential hypertension 12/10/2012   Allergic rhinitis 12/10/2012   GERD (gastroesophageal reflux disease) 12/10/2012   Varicose veins of bilateral lower extremities with pain 08/24/2008    REFERRING DIAG: M54.50,G89.29 (ICD-10-CM) - Chronic right-sided low back pain without sciatica   THERAPY DIAG:  Other low back pain  Pain in right hip  Chronic pain of right knee  Muscle weakness (generalized)  Rationale for Evaluation and Treatment Rehabilitation  PERTINENT HISTORY: Obesity, HTN   PRECAUTIONS: None  SUBJECTIVE: Pt reports only weakness of the R knee  PAIN:  Are you having pain? Yes: NPRS scale: 0/10 Pain location: R>L hip Pain description: ache Aggravating factors: Sleeping on side, prolonged standing Relieving factors: Breaks   Are you having pain? Yes: NPRS scale: 0/10 Pain location: R knee Pain  description: catch Aggravating factors: walking, prolonged sitting Relieving factors: Breaks   OBJECTIVE: (objective measures completed at initial evaluation unless otherwise dated)   DIAGNOSTIC FINDINGS:  None relevant   PATIENT SURVEYS:  FOTO 72% FOTO 74% 6th visit   SCREENING FOR RED FLAGS: Bowel or bladder incontinence: No   COGNITION:           Overall cognitive status: Within functional limits for tasks assessed                          SENSATION: WFL   MUSCLE LENGTH: Hamstrings: Right WNLs deg; Left WNLs deg Marcello Moores test: Right WNLs deg; Left WNLs deg   POSTURE: increased lumbar lordosis and out toeing   PALPATION: TTP of the R lat/upper glurteal and r greater trochanter arrea R>L. R lateral knee joint space   LUMBAR ROM:    Active  A/PROM  eval AROM 03/19/22  Flexion Full   Extension Full provoked r LBP Full no back pain  Right lateral flexion Full   Left lateral flexion Full   Right rotation Full   Left rotation Full    (Blank rows = not tested)   LOWER EXTREMITY ROM:    Grossly WNLs Active  Right eval Left eval  Hip flexion      Hip extension      Hip abduction  Hip adduction      Hip internal rotation      Hip external rotation      Knee flexion      Knee extension      Ankle dorsiflexion      Ankle plantarflexion      Ankle inversion      Ankle eversion       (Blank rows = not tested)   LOWER EXTREMITY MMT:     MMT Right eval Left eval  Hip flexion 4+ 5  Hip extension 4 4+  Hip abduction 4 5  Hip adduction      Hip internal rotation      Hip external rotation 4 5  Knee flexion 5 5  Knee extension 5 5  Ankle dorsiflexion      Ankle plantarflexion      Ankle inversion      Ankle eversion       (Blank rows = not tested)   LUMBAR SPECIAL TESTS:  Slump test: Negative Patellar apprhension: R psoitive   FUNCTIONAL TESTS:  5 times sit to stand:03/10/22:  17 sec  2 minute walk test: TBA 03/12/22: 555   GAIT: Distance  walked: 222f Assistive device utilized: None Level of assistance: Complete Independence Comments: Out toeing R>L   TODAY'S TREATMENT  [facility] Adult PT Treatment:                                                DATE: 9/1/223 Therapeutic Exercise: Nustep L7 UE/LE x 5 minutes Bilat heel raises 15 x 2  6 inch step up 10 x 2 each Wall slides c ball squeeze  Alt arm forward reach c LE balance 2x10 reaches each arm and leg Banded side steps GTB 288fx 4 Knee ext bilat 10# OMEGA 10 x 3 Knee flex bilat 20# OMEGA 10 x 3  [facility] Adult PT Treatment:                                                DATE: 03/24/22 Therapeutic Exercise: Nustep L5 UE/LE x 5 minutes 6 inch step up 10 x 2 each Bilat heel raises 10 x 2  SLS 10 sec right, 20 sec L  STS 2 x 10  from bariatric chair  Knee ext bilat 10# OMEGA 10 x 2 Knee flex bilat 20# OMEGA 10 x 2  Side hip abduction 15 x 2 each  Bilat  SLR 10 x 2  Bridge with ball squeeze 10 x 2   [facility] Adult PT Treatment:                                                DATE: 03/19/22 Therapeutic Exercise: R QS 5 sec x 10 Bilat  SLR 10 x 2  Bridge with ball squeeze 10 x 2  Side hip abduction 15 x 2 each  STS 2 x 10  from bariatric chair  LAQ 2x10 GTB Hamstring curl 2x10 GTB Nustep L5 UE/LE x 5 minutes   PATIENT EDUCATION:  Education details: FOTO score and prediction reviewed  Person educated: Patient  Education method: Explanation, Demonstration, Tactile cues, Verbal cues, and Handouts Education comprehension: verbalized understanding, returned demonstration, verbal cues required, and tactile cues required   HOME EXERCISE PROGRAM: Access Code: [email or web address] URL: [email or web address] Date: 03/17/2022 Prepared by: Gar Ponto  Exercises - Supine Bridge  - 1 x daily - 7 x weekly - 2-3 sets - 10 reps - 3 hold - Curl Up with Reach  - 1 x daily - 7 x weekly - 2-3 sets - 10 reps - 3 hold - Hooklying Clamshell with Resistance  - 1 x daily - 7 x weekly - 2-3  sets - 10 reps - 3 hold - Sidelying Hip Abduction  - 1 x daily - 7 x weekly - 2 sets - 10 reps - Sit to stand with control  - 1 x daily - 7 x weekly - 2 sets - 10 reps - 5 hold - Active Straight Leg Raise with Quad Set  - 1 x daily - 7 x weekly - 2 sets - 10 reps - 3 hold - Sitting Knee Extension with Resistance  - 1 x daily - 7 x weekly - 2 sets - 10 reps - 3 hold - Seated Hamstring Curl with Anchored Resistance  - 1 x daily - 7 x weekly - 2 sets - 10 reps - 3 hold   ASSESSMENT:   CLINICAL IMPRESSION: PT completed PT for LE strengthening with increased therex in the [facility]. Pt's primary concern is that her R knee feels weaker than the L. Pt is progressing appropriately and is able to complete PT sessions with greater physical demand. Pt will continue to benefit from skilled PT to address LE for improved function with less pain or weakness.   OBJECTIVE IMPAIRMENTS decreased activity tolerance, difficulty walking, decreased strength, obesity, and pain   ACTIVITY LIMITATIONS sitting, standing, sleeping, and locomotion level   PARTICIPATION LIMITATIONS: meal prep, shopping, community activity, and occupation   [place], Past/current experiences, and 1 comorbidity: obesity  are also affecting patient's functional outcome.    REHAB POTENTIAL: Excellent   CLINICAL DECISION MAKING: Stable/uncomplicated   EVALUATION COMPLEXITY: Low     GOALS:   SHORT TERM GOALS: Target date: 03/26/2022   Pt will be Ind in an initial HEP  Baseline: started Goal status: 03/19/22: MET   2.  Pt will voice understanding of measures to assist in pain reduction  Baseline: started Status: 03/19/22: Pt reports the HEP is helpful for her pain as well as muscle cream Goal status: MET     LONG TERM GOALS: Target date: 04/23/22   Pt will be Ind in a final HEP to maintain achieved LOF Baseline: started Goal status: ONGOING   2.  Pt will be able to complete trunk extension without increase in L low  back/hip pain Baseline: Trunk ext is the most provocative trunk motion Status: 03/19/22: Full lumbar ext without pain Goal status: MET 03/19/22   3.  Pt will report a decrease in frequency and level of low back/hip pain to 1/10 with sleeping and daily activities Baseline: 0-3/10 Status: 03/19/22: no pain with sleep since first week of PT Goal status: MET   4.  Pt's R hip will demonstrate increase strength to 4+ to 5/5 for improved function with less pain Baseline: see flow sheets Goal status: INITIAL     PLAN: PT FREQUENCY: 2x/week   PT DURATION: 6 weeks   PLANNED INTERVENTIONS: Therapeutic exercises, Therapeutic activity, Gait training, Patient/Family education, Self Care, Joint mobilization, Aquatic Therapy, Dry  Needling, Electrical stimulation, Spinal manipulation, Spinal mobilization, Cryotherapy, Moist heat, Taping, Vasopneumatic device, Traction, Ultrasound, Ionotophoresis 50m/ml Dexamethasone, Manual therapy, and Re-evaluation.   PLAN FOR NEXT SESSION: ; progress therex as indicated; assess response to HEP; use of modalitiies, manual therapy, and TPDn as indicated. Recheck FOTO at [facility]. Assess 5xSTS or 2MWT    Amberlynn Tempesta MS, PT 03/26/22 11:37 AM

## 2022-03-26 ENCOUNTER — Ambulatory Visit: Payer: Medicare Other | Attending: Registered Nurse

## 2022-03-26 DIAGNOSIS — M5459 Other low back pain: Secondary | ICD-10-CM

## 2022-03-26 DIAGNOSIS — M25561 Pain in right knee: Secondary | ICD-10-CM | POA: Diagnosis not present

## 2022-03-26 DIAGNOSIS — M25551 Pain in right hip: Secondary | ICD-10-CM

## 2022-03-26 DIAGNOSIS — G8929 Other chronic pain: Secondary | ICD-10-CM | POA: Diagnosis not present

## 2022-03-26 DIAGNOSIS — M6281 Muscle weakness (generalized): Secondary | ICD-10-CM

## 2022-03-31 ENCOUNTER — Encounter: Payer: Self-pay | Admitting: Physical Therapy

## 2022-03-31 ENCOUNTER — Ambulatory Visit: Payer: Medicare Other | Admitting: Physical Therapy

## 2022-03-31 DIAGNOSIS — M25551 Pain in right hip: Secondary | ICD-10-CM

## 2022-03-31 DIAGNOSIS — M25561 Pain in right knee: Secondary | ICD-10-CM | POA: Diagnosis not present

## 2022-03-31 DIAGNOSIS — M5459 Other low back pain: Secondary | ICD-10-CM | POA: Diagnosis not present

## 2022-03-31 DIAGNOSIS — M6281 Muscle weakness (generalized): Secondary | ICD-10-CM | POA: Diagnosis not present

## 2022-03-31 DIAGNOSIS — G8929 Other chronic pain: Secondary | ICD-10-CM | POA: Diagnosis not present

## 2022-03-31 NOTE — Therapy (Signed)
OUTPATIENT PHYSICAL THERAPY TREATMENT NOTE   Patient Name: Frances Hunter MRN: [id number] DOB:1951-07-02, 71 y.o., female Today's Date: 03/31/2022  PCP: Maximiano Coss, NP   REFERRING PROVIDER: Maximiano Coss, NP  END OF SESSION:   PT End of Session - 03/31/22 1339     Visit Number 8    Number of Visits 13    Date for PT Re-Evaluation 04/23/22    Authorization Type BCBS/[employer] PPO, MEDICARE PART A AND B    PT Start Time 1336    PT Stop Time 1416    PT Time Calculation (min) 40 min                 Past Medical History:  Diagnosis Date   GERD (gastroesophageal reflux disease)    Hypertension    Osteopenia    low Frax score, jan 2019   Seasonal allergies    Past Surgical History:  Procedure Laterality Date   TUBAL LIGATION     Patient Active Problem List   Diagnosis Date Noted   Insomnia 01/02/2019   Seasonal allergies    Osteopenia    Osteopenia of thigh 07/07/2015   Chronic venous insufficiency 07/12/2014   Essential hypertension 12/10/2012   Allergic rhinitis 12/10/2012   GERD (gastroesophageal reflux disease) 12/10/2012   Varicose veins of bilateral lower extremities with pain 08/24/2008    REFERRING DIAG: M54.50,G89.29 (ICD-10-CM) - Chronic right-sided low back pain without sciatica   THERAPY DIAG:  Other low back pain  Pain in right hip  Rationale for Evaluation and Treatment Rehabilitation  PERTINENT HISTORY: Obesity, HTN   PRECAUTIONS: None  SUBJECTIVE: Pt reports no pain today. She reports intermittent catch in right knee.   PAIN:  Are you having pain? Yes: NPRS scale: 0/10 Pain location: R>L hip Pain description: ache Aggravating factors: Sleeping on side, prolonged standing Relieving factors: Breaks   Are you having pain? Yes: NPRS scale: 0/10 Pain location: R knee Pain description: catch Aggravating factors: walking, prolonged sitting Relieving factors: Breaks   OBJECTIVE: (objective measures completed at initial  evaluation unless otherwise dated)   DIAGNOSTIC FINDINGS:  None relevant   PATIENT SURVEYS:  FOTO 72% FOTO 74% 6th visit   SCREENING FOR RED FLAGS: Bowel or bladder incontinence: No   COGNITION:           Overall cognitive status: Within functional limits for tasks assessed                          SENSATION: WFL   MUSCLE LENGTH: Hamstrings: Right WNLs deg; Left WNLs deg Marcello Moores test: Right WNLs deg; Left WNLs deg   POSTURE: increased lumbar lordosis and out toeing   PALPATION: TTP of the R lat/upper glurteal and r greater trochanter arrea R>L. R lateral knee joint space   LUMBAR ROM:    Active  A/PROM  eval AROM 03/19/22  Flexion Full   Extension Full provoked r LBP Full no back pain  Right lateral flexion Full   Left lateral flexion Full   Right rotation Full   Left rotation Full    (Blank rows = not tested)   LOWER EXTREMITY ROM:    Grossly WNLs Active  Right eval Left eval  Hip flexion      Hip extension      Hip abduction      Hip adduction      Hip internal rotation      Hip external rotation  Knee flexion      Knee extension      Ankle dorsiflexion      Ankle plantarflexion      Ankle inversion      Ankle eversion       (Blank rows = not tested)   LOWER EXTREMITY MMT:     MMT Right eval Left eval Right  03/31/22  Hip flexion 4+ 5   Hip extension 4 4+   Hip abduction 4 5 4   Hip adduction       Hip internal rotation       Hip external rotation 4 5   Knee flexion 5 5   Knee extension 5 5   Ankle dorsiflexion       Ankle plantarflexion       Ankle inversion       Ankle eversion        (Blank rows = not tested)   LUMBAR SPECIAL TESTS:  Slump test: Negative Patellar apprhension: R psoitive   FUNCTIONAL TESTS:  5 times sit to stand:03/10/22:  17 sec  2 minute walk test: TBA 03/12/22: 555   GAIT: Distance walked: 231f Assistive device utilized: None Level of assistance: Complete Independence Comments: Out toeing R>L   TODAY'S  TREATMENT  [facility] Adult PT Treatment:                                                DATE: 03/31/22 Therapeutic Exercise: Nustep L7 UE/LE x 5 minutes Bilat heel raises 15 x 2  8 inch step up 10 x 2 each  Banded side steps GTB 270fx 4 Standing green band hip abdct x 10 each Knee ext bilat 15# OMEGA 10 x 3 Knee flex bilat 25# OMEGA 10 x 3 Wall slides c ball squeeze  Side hp abduction 10 x 2 right  Bridge x 5  Single leg bridge 2 x 10 each   [facility] Adult PT Treatment:                                                DATE: 9/1/223 Therapeutic Exercise: Nustep L7 UE/LE x 5 minutes Bilat heel raises 15 x 2  6 inch step up 10 x 2 each Wall slides c ball squeeze  Alt arm forward reach c LE balance 2x10 reaches each arm and leg Banded side steps GTB 2067f 4 Knee ext bilat 10# OMEGA 10 x 3 Knee flex bilat 20# OMEGA 10 x 3  [facility] Adult PT Treatment:                                                DATE: 03/24/22 Therapeutic Exercise: Nustep L5 UE/LE x 5 minutes 6 inch step up 10 x 2 each Bilat heel raises 10 x 2  SLS 10 sec right, 20 sec L  STS 2 x 10  from bariatric chair  Knee ext bilat 10# OMEGA 10 x 2 Knee flex bilat 20# OMEGA 10 x 2  Side hip abduction 15 x 2 each  Bilat  SLR 10 x 2  Bridge with ball squeeze  10 x 2   [facility] Adult PT Treatment:                                                DATE: 03/19/22 Therapeutic Exercise: R QS 5 sec x 10 Bilat  SLR 10 x 2  Bridge with ball squeeze 10 x 2  Side hip abduction 15 x 2 each  STS 2 x 10  from bariatric chair  LAQ 2x10 GTB Hamstring curl 2x10 GTB Nustep L5 UE/LE x 5 minutes   PATIENT EDUCATION:  Education details: FOTO score and prediction reviewed  Person educated: Patient Education method: Explanation, Demonstration, Tactile cues, Verbal cues, and Handouts Education comprehension: verbalized understanding, returned demonstration, verbal cues required, and tactile cues required   HOME EXERCISE PROGRAM: Access Code: [email or web address] URL:  [email or web address] Date: 03/17/2022 Prepared by: Gar Ponto  Exercises - Supine Bridge  - 1 x daily - 7 x weekly - 2-3 sets - 10 reps - 3 hold - Curl Up with Reach  - 1 x daily - 7 x weekly - 2-3 sets - 10 reps - 3 hold - Hooklying Clamshell with Resistance  - 1 x daily - 7 x weekly - 2-3 sets - 10 reps - 3 hold - Sidelying Hip Abduction  - 1 x daily - 7 x weekly - 2 sets - 10 reps - Sit to stand with control  - 1 x daily - 7 x weekly - 2 sets - 10 reps - 5 hold - Active Straight Leg Raise with Quad Set  - 1 x daily - 7 x weekly - 2 sets - 10 reps - 3 hold - Sitting Knee Extension with Resistance  - 1 x daily - 7 x weekly - 2 sets - 10 reps - 3 hold - Seated Hamstring Curl with Anchored Resistance  - 1 x daily - 7 x weekly - 2 sets - 10 reps - 3 hold   ASSESSMENT:   CLINICAL IMPRESSION: PT completed PT for LE strengthening with increased resistance tolerated well. Her hip abduction remains 4/5 on right. Increased gluteal challenges today in open and closed chain.  Pt is progressing appropriately and is able to complete PT sessions with greater physical demand. Pt will continue to benefit from skilled PT to address LE for improved function with less pain or weakness.   OBJECTIVE IMPAIRMENTS decreased activity tolerance, difficulty walking, decreased strength, obesity, and pain   ACTIVITY LIMITATIONS sitting, standing, sleeping, and locomotion level   PARTICIPATION LIMITATIONS: meal prep, shopping, community activity, and occupation   [place], Past/current experiences, and 1 comorbidity: obesity  are also affecting patient's functional outcome.    REHAB POTENTIAL: Excellent   CLINICAL DECISION MAKING: Stable/uncomplicated   EVALUATION COMPLEXITY: Low     GOALS:   SHORT TERM GOALS: Target date: 03/26/2022   Pt will be Ind in an initial HEP  Baseline: started Goal status: 03/19/22: MET   2.  Pt will voice understanding of measures to assist in  pain reduction  Baseline: started Status: 03/19/22: Pt reports the HEP is helpful for her pain as well as muscle cream Goal status: MET     LONG TERM GOALS: Target date: 04/23/22   Pt will be Ind in a final HEP to maintain achieved LOF Baseline: started Goal status: ONGOING   2.  Pt will be able to complete  trunk extension without increase in L low back/hip pain Baseline: Trunk ext is the most provocative trunk motion Status: 03/19/22: Full lumbar ext without pain Goal status: MET 03/19/22   3.  Pt will report a decrease in frequency and level of low back/hip pain to 1/10 with sleeping and daily activities Baseline: 0-3/10 Status: 03/19/22: no pain with sleep since first week of PT Goal status: MET   4.  Pt's R hip will demonstrate increase strength to 4+ to 5/5 for improved function with less pain Baseline: see flow sheets Status: 03/31/22: hip abduction 4/5 Goal status: ONGOING     PLAN: PT FREQUENCY: 2x/week   PT DURATION: 6 weeks   PLANNED INTERVENTIONS: Therapeutic exercises, Therapeutic activity, Gait training, Patient/Family education, Self Care, Joint mobilization, Aquatic Therapy, Dry Needling, Electrical stimulation, Spinal manipulation, Spinal mobilization, Cryotherapy, Moist heat, Taping, Vasopneumatic device, Traction, Ultrasound, Ionotophoresis 28m/ml Dexamethasone, Manual therapy, and Re-evaluation.   PLAN FOR NEXT SESSION: ; progress therex as indicated; assess response to HEP; use of modalitiies, manual therapy, and TPDn as indicated. Recheck FOTO at DC. Assess 5xSTS or 2MWT    JHessie Diener PTA 03/31/22 2:16 PM Phone: [phone]: [phone]

## 2022-04-07 ENCOUNTER — Ambulatory Visit: Payer: Medicare Other | Admitting: Physical Therapy

## 2022-04-07 ENCOUNTER — Encounter: Payer: Self-pay | Admitting: Physical Therapy

## 2022-04-07 DIAGNOSIS — M6281 Muscle weakness (generalized): Secondary | ICD-10-CM | POA: Diagnosis not present

## 2022-04-07 DIAGNOSIS — M25551 Pain in right hip: Secondary | ICD-10-CM | POA: Diagnosis not present

## 2022-04-07 DIAGNOSIS — M25561 Pain in right knee: Secondary | ICD-10-CM | POA: Diagnosis not present

## 2022-04-07 DIAGNOSIS — G8929 Other chronic pain: Secondary | ICD-10-CM | POA: Diagnosis not present

## 2022-04-07 DIAGNOSIS — M5459 Other low back pain: Secondary | ICD-10-CM | POA: Diagnosis not present

## 2022-04-07 NOTE — Therapy (Signed)
OUTPATIENT PHYSICAL THERAPY TREATMENT NOTE   Patient Name: Frances Hunter MRN: [id number] DOB:November 17, 1950, 71 y.o., female Today's Date: 04/07/2022  PCP: Maximiano Coss, NP   REFERRING PROVIDER: Maximiano Coss, NP  END OF SESSION:   PT End of Session - 04/07/22 1043     Visit Number 9    Number of Visits 13    Date for PT Re-Evaluation 04/23/22    Authorization Type BCBS/[employer] PPO, MEDICARE PART A AND B    PT Start Time 1025   10 min late   PT Stop Time 1103    PT Time Calculation (min) 38 min                 Past Medical History:  Diagnosis Date   GERD (gastroesophageal reflux disease)    Hypertension    Osteopenia    low Frax score, jan 2019   Seasonal allergies    Past Surgical History:  Procedure Laterality Date   TUBAL LIGATION     Patient Active Problem List   Diagnosis Date Noted   Insomnia 01/02/2019   Seasonal allergies    Osteopenia    Osteopenia of thigh 07/07/2015   Chronic venous insufficiency 07/12/2014   Essential hypertension 12/10/2012   Allergic rhinitis 12/10/2012   GERD (gastroesophageal reflux disease) 12/10/2012   Varicose veins of bilateral lower extremities with pain 08/24/2008    REFERRING DIAG: M54.50,G89.29 (ICD-10-CM) - Chronic right-sided low back pain without sciatica   THERAPY DIAG:  Other low back pain  Pain in right hip  Rationale for Evaluation and Treatment Rehabilitation  PERTINENT HISTORY: Obesity, HTN   PRECAUTIONS: None  SUBJECTIVE: Pt reports no pain today. She reports intermittent catch in right knee.   PAIN:  Are you having pain? Yes: NPRS scale: 0/10 Pain location: R>L hip Pain description: ache Aggravating factors: Sleeping on side, prolonged standing Relieving factors: Breaks   Are you having pain? Yes: NPRS scale: 0/10 Pain location: R knee Pain description: catch Aggravating factors: walking, prolonged sitting Relieving factors: Breaks   OBJECTIVE: (objective measures  completed at initial evaluation unless otherwise dated)   DIAGNOSTIC FINDINGS:  None relevant   PATIENT SURVEYS:  FOTO 71% FOTO 74% 6th visit   SCREENING FOR RED FLAGS: Bowel or bladder incontinence: No   COGNITION:           Overall cognitive status: Within functional limits for tasks assessed                          SENSATION: WFL   MUSCLE LENGTH: Hamstrings: Right WNLs deg; Left WNLs deg Marcello Moores test: Right WNLs deg; Left WNLs deg   POSTURE: increased lumbar lordosis and out toeing   PALPATION: TTP of the R lat/upper glurteal and r greater trochanter arrea R>L. R lateral knee joint space   LUMBAR ROM:    Active  A/PROM  eval AROM 03/19/22  Flexion Full   Extension Full provoked r LBP Full no back pain  Right lateral flexion Full   Left lateral flexion Full   Right rotation Full   Left rotation Full    (Blank rows = not tested)   LOWER EXTREMITY ROM:    Grossly WNLs Active  Right eval Left eval  Hip flexion      Hip extension      Hip abduction      Hip adduction      Hip internal rotation      Hip external  rotation      Knee flexion      Knee extension      Ankle dorsiflexion      Ankle plantarflexion      Ankle inversion      Ankle eversion       (Blank rows = not tested)   LOWER EXTREMITY MMT:     MMT Right eval Left eval Right  03/31/22  Hip flexion 4+ 5   Hip extension 4 4+   Hip abduction _0 Hip adduction       Hip internal rotation       Hip external rotation 4 5   Knee flexion 5 5   Knee extension 5 5   Ankle dorsiflexion       Ankle plantarflexion       Ankle inversion       Ankle eversion        (Blank rows = not tested)   LUMBAR SPECIAL TESTS:  Slump test: Negative Patellar apprhension: R psoitive   FUNCTIONAL TESTS:  5 times sit to stand:03/10/22:  17 sec  2 minute walk test: TBA 03/12/22: 555; 04/07/22 583 feet   GAIT: Distance walked: 221f Assistive device utilized: None Level of assistance: Complete  Independence Comments: Out toeing R>L   TODAY'S TREATMENT  [facility] Adult PT Treatment:                                                DATE: 04/07/22 Therapeutic Exercise: Nustep L6 UE/LE x 5 minutes Bilat heel raises 15 x 2  8 inch step up 15 x 2 each 2MWT 583 feet Knee ext bilat 15# OMEGA 15 x 2 Knee flex bilat 30# OMEGA 15 x 2 10# KB STS 15 x 2  Side hip abduction (glut med) 2# x 15 each- cues for leg placement    [facility] Adult PT Treatment:                                                DATE: 03/31/22 Therapeutic Exercise: Nustep L7 UE/LE x 5 minutes Bilat heel raises 15 x 2  8 inch step up 10 x 2 each  Banded side steps GTB 297fx 4 Standing green band hip abdct x 10 each Knee ext bilat 15# OMEGA 10 x 3 Knee flex bilat 25# OMEGA 10 x 3 Wall slides c ball squeeze  Side hp abduction 10 x 2 right  Bridge x 5  Single leg bridge 2 x 10 each   [facility] Adult PT Treatment:                                                DATE: 9/1/223 Therapeutic Exercise: Nustep L7 UE/LE x 5 minutes Bilat heel raises 15 x 2  6 inch step up 10 x 2 each Wall slides c ball squeeze  Alt arm forward reach c LE balance 2x10 reaches each arm and leg Banded side steps GTB 2069f 4 Knee ext bilat 10# OMEGA 10 x 3 Knee flex bilat 20# OMEGA 10 x 3  [facility] Adult  PT Treatment:                                                DATE: 03/24/22 Therapeutic Exercise: Nustep L5 UE/LE x 5 minutes 6 inch step up 10 x 2 each Bilat heel raises 10 x 2  SLS 10 sec right, 20 sec L  STS 2 x 10  from bariatric chair  Knee ext bilat 10# OMEGA 10 x 2 Knee flex bilat 20# OMEGA 10 x 2  Side hip abduction 15 x 2 each  Bilat  SLR 10 x 2  Bridge with ball squeeze 10 x 2   [facility] Adult PT Treatment:                                                DATE: 03/19/22 Therapeutic Exercise: R QS 5 sec x 10 Bilat  SLR 10 x 2  Bridge with ball squeeze 10 x 2  Side hip abduction 15 x 2 each  STS 2 x 10  from bariatric chair  LAQ 2x10  GTB Hamstring curl 2x10 GTB Nustep L5 UE/LE x 5 minutes   PATIENT EDUCATION:  Education details: FOTO score and prediction reviewed  Person educated: Patient Education method: Explanation, Demonstration, Tactile cues, Verbal cues, and Handouts Education comprehension: verbalized understanding, returned demonstration, verbal cues required, and tactile cues required   HOME EXERCISE PROGRAM: Access Code: [email or web address] URL: [email or web address] Date: 03/17/2022 Prepared by: Gar Ponto  Exercises - Supine Bridge  - 1 x daily - 7 x weekly - 2-3 sets - 10 reps - 3 hold - Curl Up with Reach  - 1 x daily - 7 x weekly - 2-3 sets - 10 reps - 3 hold - Hooklying Clamshell with Resistance  - 1 x daily - 7 x weekly - 2-3 sets - 10 reps - 3 hold - Sidelying Hip Abduction  - 1 x daily - 7 x weekly - 2 sets - 10 reps - Sit to stand with control  - 1 x daily - 7 x weekly - 2 sets - 10 reps - 5 hold - Active Straight Leg Raise with Quad Set  - 1 x daily - 7 x weekly - 2 sets - 10 reps - 3 hold - Sitting Knee Extension with Resistance  - 1 x daily - 7 x weekly - 2 sets - 10 reps - 3 hold - Seated Hamstring Curl with Anchored Resistance  - 1 x daily - 7 x weekly - 2 sets - 10 reps - 3 hold   ASSESSMENT:   CLINICAL IMPRESSION: PT completed PT for LE strengthening with increased resistance tolerated well. . Increased gluteal challenges today in open and closed chain. Added weight to STS and weight to side hip abduction with cues to target glut medius.  She increased distance in 2 MWT. Pt will continue to benefit from skilled PT to address LE for improved function with less pain or weakness. She has one more visit to complete LTGs.    OBJECTIVE IMPAIRMENTS decreased activity tolerance, difficulty walking, decreased strength, obesity, and pain   ACTIVITY LIMITATIONS sitting, standing, sleeping, and locomotion level   PARTICIPATION LIMITATIONS: meal prep, shopping, community activity, and  occupation   [employer], Past/current experiences, and 1 comorbidity: obesity  are also affecting patient's functional outcome.    REHAB POTENTIAL: Excellent   CLINICAL DECISION MAKING: Stable/uncomplicated   EVALUATION COMPLEXITY: Low     GOALS:   SHORT TERM GOALS: Target date: 03/26/2022   Pt will be Ind in an initial HEP  Baseline: started Goal status: 03/19/22: MET   2.  Pt will voice understanding of measures to assist in pain reduction  Baseline: started Status: 03/19/22: Pt reports the HEP is helpful for her pain as well as muscle cream Goal status: MET     LONG TERM GOALS: Target date: 04/23/22   Pt will be Ind in a final HEP to maintain achieved LOF Baseline: started Goal status: ONGOING   2.  Pt will be able to complete trunk extension without increase in L low back/hip pain Baseline: Trunk ext is the most provocative trunk motion Status: 03/19/22: Full lumbar ext without pain Goal status: MET 03/19/22   3.  Pt will report a decrease in frequency and level of low back/hip pain to 1/10 with sleeping and daily activities Baseline: 0-3/10 Status: 03/19/22: no pain with sleep since first week of PT Goal status: MET   4.  Pt's R hip will demonstrate increase strength to 4+ to 5/5 for improved function with less pain Baseline: see flow sheets Status: 03/31/22: hip abduction 4/5 Goal status: ONGOING     PLAN: PT FREQUENCY: 2x/week   PT DURATION: 6 weeks   PLANNED INTERVENTIONS: Therapeutic exercises, Therapeutic activity, Gait training, Patient/Family education, Self Care, Joint mobilization, Aquatic Therapy, Dry Needling, Electrical stimulation, Spinal manipulation, Spinal mobilization, Cryotherapy, Moist heat, Taping, Vasopneumatic device, Traction, Ultrasound, Ionotophoresis 12m/ml Dexamethasone, Manual therapy, and Re-evaluation.   PLAN FOR NEXT SESSION: ; Recheck 5 x STS and FOTO at DC next visit.      JHessie Diener PTA 04/07/22 11:27 AM Phone:  [phone]: [phone]

## 2022-04-14 ENCOUNTER — Ambulatory Visit: Payer: Medicare Other

## 2022-04-14 DIAGNOSIS — M5459 Other low back pain: Secondary | ICD-10-CM | POA: Diagnosis not present

## 2022-04-14 DIAGNOSIS — M25551 Pain in right hip: Secondary | ICD-10-CM | POA: Diagnosis not present

## 2022-04-14 DIAGNOSIS — M25561 Pain in right knee: Secondary | ICD-10-CM | POA: Diagnosis not present

## 2022-04-14 DIAGNOSIS — G8929 Other chronic pain: Secondary | ICD-10-CM | POA: Diagnosis not present

## 2022-04-14 DIAGNOSIS — M6281 Muscle weakness (generalized): Secondary | ICD-10-CM

## 2022-04-14 NOTE — Therapy (Addendum)
OUTPATIENT PHYSICAL THERAPY TREATMENT NOTE/Discharge/Progress note   Patient Name: Frances Hunter MRN: [id number] DOB:[birth year], 71 y.o., female Today's Date: 04/14/2022  PCP: Maximiano Coss, NP   REFERRING PROVIDER: Maximiano Coss, NP  Progress Note Reporting Period 03/05/22 to 04/14/22  See note below for Objective Data and Assessment of Progress/Goals.      END OF SESSION:   PT End of Session - 04/14/22 1112     Visit Number 10    Number of Visits 13    Date for PT Re-Evaluation 04/23/22    Authorization Type [employer] PPO, MEDICARE PART A AND B    PT Start Time 1111    PT Stop Time 1154    PT Time Calculation (min) 43 min    Activity Tolerance Patient tolerated treatment well    Behavior During Therapy WFL for tasks assessed/performed                  Past Medical History:  Diagnosis Date   GERD (gastroesophageal reflux disease)    Hypertension    Osteopenia    low Frax score, jan 2019   Seasonal allergies    Past Surgical History:  Procedure Laterality Date   TUBAL LIGATION     Patient Active Problem List   Diagnosis Date Noted   Insomnia 01/02/2019   Seasonal allergies    Osteopenia    Osteopenia of thigh 07/07/2015   Chronic venous insufficiency 07/12/2014   Essential hypertension 12/10/2012   Allergic rhinitis 12/10/2012   GERD (gastroesophageal reflux disease) 12/10/2012   Varicose veins of bilateral lower extremities with pain 08/24/2008    REFERRING DIAG: M54.50,G89.29 (ICD-10-CM) - Chronic right-sided low back pain without sciatica   THERAPY DIAG:  Other low back pain  Pain in right hip  Chronic pain of right knee  Muscle weakness (generalized)  Rationale for Evaluation and Treatment Rehabilitation  PERTINENT HISTORY: Obesity, HTN   PRECAUTIONS: None  SUBJECTIVE: Pt reports she is continuing to do very well. Her function has improved to where she played basketball with her grandchildren. Pt reports a  weakness/catch of her R knee.  PAIN:  Are you having pain? Yes: NPRS scale: 0/10 Pain location: R>L hip Pain description: ache Aggravating factors: Sleeping on side, prolonged standing Relieving factors: Breaks   Are you having pain? Yes: NPRS scale: 0/10 Pain location: R knee Pain description: catch Aggravating factors: walking, prolonged sitting Relieving factors: Breaks   OBJECTIVE: (objective measures completed at initial evaluation unless otherwise dated)   DIAGNOSTIC FINDINGS:  None relevant   PATIENT SURVEYS:  FOTO 72% FOTO 74% 6th visit 04/14/22= 87%   SCREENING FOR RED FLAGS: Bowel or bladder incontinence: No   COGNITION:           Overall cognitive status: Within functional limits for tasks assessed                          SENSATION: WFL   MUSCLE LENGTH: Hamstrings: Right WNLs deg; Left WNLs deg Marcello Moores test: Right WNLs deg; Left WNLs deg   POSTURE: increased lumbar lordosis and out toeing   PALPATION: TTP of the R lat/upper glurteal and r greater trochanter arrea R>L. R lateral knee joint space   LUMBAR ROM:    Active  A/PROM  eval AROM 03/19/22  Flexion Full   Extension Full provoked r LBP Full no back pain  Right lateral flexion Full   Left lateral flexion Full   Right rotation Full  Left rotation Full    (Blank rows = not tested)   LOWER EXTREMITY ROM:    Grossly WNLs Active  Right eval Left eval  Hip flexion      Hip extension      Hip abduction      Hip adduction      Hip internal rotation      Hip external rotation      Knee flexion      Knee extension      Ankle dorsiflexion      Ankle plantarflexion      Ankle inversion      Ankle eversion       (Blank rows = not tested)   LOWER EXTREMITY MMT:     MMT Right eval Left eval Right  03/31/22 Rt 04/14/22  Hip flexion 4+ 5  4+  Hip extension 4 4+  4+  Hip abduction 4 5 4  4+  Hip adduction        Hip internal rotation        Hip external rotation 4 5  4+  Knee flexion 5  5    Knee extension 5 5    Ankle dorsiflexion        Ankle plantarflexion        Ankle inversion        Ankle eversion         (Blank rows = not tested)   LUMBAR SPECIAL TESTS:  Slump test: Negative Patellar apprhension: R psoitive   FUNCTIONAL TESTS:  5 times sit to stand:03/10/22:  17 sec  2 minute walk test: TBA 03/12/22: 555; 04/07/22 583 feet   GAIT: Distance walked: 240f Assistive device utilized: None Level of assistance: Complete Independence Comments: Out toeing R>L   TODAY'S TREATMENT  [facility] Adult PT Treatment:                                                DATE: 04/14/22 Therapeutic Exercise: Nustep L6 UE/LE x 5 minutes Standing hip abd, hip ext, hip flex, knee flex x10 each Bilat heel raises 15 x 2  10# KB STS 15 x 2 Updated HEP  Self Care: FOTO reassessed and reviewed  [facility]                                                [facility]: [facility] Therapeutic Exercise: Nustep L6 UE/LE x 5 minutes Bilat heel raises 15 x 2  8 inch step up 15 x 2 each 2MWT 583 feet Knee ext bilat 15# OMEGA 15 x 2 Knee flex bilat 30# OMEGA 15 x 2 10# KB STS 15 x 2  Side hip abduction (glut med) 2# x 15 each- cues for leg placement    [facility] Adult PT Treatment:                                                DATE: 03/31/22 Therapeutic Exercise: Nustep L7 UE/LE x 5 minutes Bilat heel raises 15 x 2  8 inch step up 10 x 2 each  Banded side steps GTB  56f x 4 Standing green band hip abdct x 10 each Knee ext bilat 15# OMEGA 10 x 3 Knee flex bilat 25# OMEGA 10 x 3 Wall slides c ball squeeze  Side hp abduction 10 x 2 right  Bridge x 5  Single leg bridge 2 x 10 each     PATIENT EDUCATION:  Education details: FOTO score and prediction reviewed  Person educated: Patient Education method: Explanation, Demonstration, Tactile cues, Verbal cues, and Handouts Education comprehension: verbalized understanding, returned demonstration, verbal cues required, and tactile cues  required   HOME EXERCISE PROGRAM: Access Code: [email or web address] URL: [email or web address] Date: 04/14/2022 Prepared by: AGar Ponto Exercises - Supine Bridge  - 1 x daily - 7 x weekly - 2-3 sets - 10 reps - 3 hold - Curl Up with Reach  - 1 x daily - 7 x weekly - 2-3 sets - 10 reps - 3 hold - Hooklying Clamshell with Resistance  - 1 x daily - 7 x weekly - 2-3 sets - 10 reps - 3 hold - Sidelying Hip Abduction  - 1 x daily - 7 x weekly - 2 sets - 10 reps - Active Straight Leg Raise with Quad Set  - 1 x daily - 7 x weekly - 2 sets - 10 reps - 3 hold - Sitting Knee Extension with Resistance  - 1 x daily - 7 x weekly - 2 sets - 10 reps - 3 hold - Seated Hamstring Curl with Anchored Resistance  - 1 x daily - 7 x weekly - 2 sets - 10 reps - 3 hold - Sit to stand with control  - 1 x daily - 7 x weekly - 2 sets - 10 reps - 5 hold - Heel Toe Raises with Counter Support  - 1 x daily - 7 x weekly - 1-2 sets - 15 reps - 2 hold - Standing Hip Abduction with Counter Support  - 1 x daily - 7 x weekly - 2-3 sets - 10 reps - 2 hold - Standing Hip Extension with Counter Support  - 1 x daily - 7 x weekly - 2-3 sets - 10 reps - 2 hold - Standing Hip Flexion with Counter Support  - 1 x daily - 7 x weekly - 2-3 sets - 10 reps - 2 hold - Standing Knee Flexion with Counter Support  - 1 x daily - 7 x weekly - 2-3 sets - 10 reps - 2 hold   ASSESSMENT:   CLINICAL IMPRESSION: PT was completed for trunk/LE strengtheing. Pt's HEP was updated for standing theraband exs. Pt returned demonstration. Pt has made good press with PT re: pain, strength, and function. Pt has met set goals. Pt is Ind c a HEP to [employer]   OBJECTIVE IMPAIRMENTS decreased activity tolerance, difficulty walking, decreased strength, obesity, and pain   ACTIVITY LIMITATIONS sitting, standing, sleeping, and locomotion level   PARTICIPATION LIMITATIONS: meal prep, shopping, community activity, and occupation   [place] Past/current experiences, and 1 comorbidity: obesity  are also affecting patient's functional outcome.    REHAB POTENTIAL: Excellent   CLINICAL DECISION MAKING: Stable/uncomplicated   EVALUATION COMPLEXITY: Low     GOALS:   SHORT TERM GOALS: Target date: 03/26/2022   Pt will be Ind in an initial HEP  Baseline: started Goal status: 03/19/22: MET   2.  Pt will voice understanding of measures to assist in pain reduction  Baseline: started Status: 03/19/22: Pt reports the HEP is helpful for  her pain as well as muscle cream Goal status: MET     LONG TERM GOALS: Target date: 04/23/22   Pt will be Ind in a final HEP to maintain achieved LOF Baseline: started Status: Ind Goal status:MET   2.  Pt will be able to complete trunk extension without increase in L low back/hip pain Baseline: Trunk ext is the most provocative trunk motion Status: 03/19/22: Full lumbar ext without pain Goal status: MET 03/19/22   3.  Pt will report a decrease in frequency and level of low back/hip pain to 1/10 with sleeping and daily activities Baseline: 0-3/10 Status: 03/19/22: no pain with sleep since first week of PT Goal status: MET   4.  Pt's R hip will demonstrate increase strength to 4+ to 5/5 for improved function with less pain Baseline: see flow sheets Status: 03/31/22: hip abduction 4/5; 04/14/22=4+/5 Goal status: MET     PLAN: PT FREQUENCY: 2x/week   PT DURATION: 6 weeks   PLANNED INTERVENTIONS: Therapeutic exercises, Therapeutic activity, Gait training, Patient/Family education, Self Care, Joint mobilization, Aquatic Therapy, Dry Needling, Electrical stimulation, Spinal manipulation, Spinal mobilization, Cryotherapy, Moist heat, Taping, Vasopneumatic device, Traction, Ultrasound, Ionotophoresis 21m/ml Dexamethasone, Manual therapy, and Re-evaluation.   PLAN FOR NEXT SESSION::  PHYSICAL THERAPY DISCHARGE SUMMARY  Visits from Start of Care: 10  Current functional level  related to goals / functional outcomes: See clinical impression and goals   Remaining deficits: See clinical impression and goals   Education / Equipment: HEP   Patient agrees to discharge. Patient goals were met. Patient is being discharged due to being pleased with the current functional level.    Othello Sgroi MS, PT 04/14/22 2:09 PM

## 2022-06-03 ENCOUNTER — Ambulatory Visit (INDEPENDENT_AMBULATORY_CARE_PROVIDER_SITE_OTHER): Payer: Medicare Other | Admitting: Family Medicine

## 2022-06-03 ENCOUNTER — Encounter: Payer: Self-pay | Admitting: Family Medicine

## 2022-06-03 VITALS — BP 122/76 | HR 92 | Temp 98.9°F | Resp 17 | Ht 63.0 in | Wt 181.5 lb

## 2022-06-03 DIAGNOSIS — I10 Essential (primary) hypertension: Secondary | ICD-10-CM

## 2022-06-03 DIAGNOSIS — M79641 Pain in right hand: Secondary | ICD-10-CM

## 2022-06-03 DIAGNOSIS — Z23 Encounter for immunization: Secondary | ICD-10-CM | POA: Diagnosis not present

## 2022-06-03 DIAGNOSIS — J309 Allergic rhinitis, unspecified: Secondary | ICD-10-CM | POA: Diagnosis not present

## 2022-06-03 DIAGNOSIS — K219 Gastro-esophageal reflux disease without esophagitis: Secondary | ICD-10-CM

## 2022-06-03 DIAGNOSIS — E669 Obesity, unspecified: Secondary | ICD-10-CM | POA: Diagnosis not present

## 2022-06-03 DIAGNOSIS — M79642 Pain in left hand: Secondary | ICD-10-CM

## 2022-06-03 MED ORDER — FLUTICASONE PROPIONATE 50 MCG/ACT NA SUSP: 2.0000 | Freq: Every day | NASAL | 6 refills | Status: DC

## 2022-06-03 MED ORDER — LISINOPRIL-HYDROCHLOROTHIAZIDE 10-12.5 MG PO TABS: 1.0000 | ORAL_TABLET | Freq: Every day | ORAL | 3 refills | Status: DC

## 2022-06-03 MED ORDER — FAMOTIDINE 20 MG PO TABS: 20.0000 mg | ORAL_TABLET | Freq: Two times a day (BID) | ORAL | 3 refills | Status: DC | PRN

## 2022-06-03 MED ORDER — DICLOFENAC SODIUM 1 % EX CREA: 4.0000 g | TOPICAL_CREAM | Freq: Four times a day (QID) | CUTANEOUS | 2 refills | Status: DC | PRN

## 2022-06-03 NOTE — Assessment & Plan Note (Signed)
Refill on Famotidine provided for her to use PRN.

## 2022-06-03 NOTE — Assessment & Plan Note (Signed)
Chronic problem.  Currently well controlled on Lisinopril HCTZ 10/12.5mg  daily.  Asymptomatic.  Check labs due to ACE and diuretic but no anticipated med changes.

## 2022-06-03 NOTE — Assessment & Plan Note (Signed)
>>  ASSESSMENT AND PLAN FOR ALLERGIC RHINITIS WRITTEN ON 06/03/2022  3:53 PM BY Sheliah Hatch, MD  Ongoing issue for pt.  Refill on Flonase provided.

## 2022-06-03 NOTE — Progress Notes (Signed)
   Subjective:    Patient ID: Frances Hunter, female    DOB: [birth year], 71 y.o.   MRN: [id number]  HPI HTN- chronic problem, on Lisinopril HCTZ 10/12.5mg  daily w/ good control.  No CP, SOB, Has, visual changes, edema.  Obesity- weight is stable.  BMI 32.15  Pt is doing home PT exercises for her hip and will occasionally do recumbent bike.  Allergic Rhinitis- pt asking for refill on Flonase.  + nasal congestion and dry cough   Review of Systems For ROS see HPI     Objective:   Physical Exam Vitals reviewed.  Constitutional:      General: She is not in acute distress.    Appearance: Normal appearance. She is well-developed. She is obese. She is not ill-appearing.  HENT:     Head: Normocephalic and atraumatic.  Eyes:     Conjunctiva/sclera: Conjunctivae normal.     Pupils: Pupils are equal, round, and reactive to light.  Neck:     Thyroid: No thyromegaly.  Cardiovascular:     Rate and Rhythm: Normal rate and regular rhythm.     Pulses: Normal pulses.     Heart sounds: Normal heart sounds. No murmur heard. Pulmonary:     Effort: Pulmonary effort is normal. No respiratory distress.     Breath sounds: Normal breath sounds.  Abdominal:     General: There is no distension.     Palpations: Abdomen is soft.     Tenderness: There is no abdominal tenderness.  Musculoskeletal:     Cervical back: Normal range of motion and neck supple.     Right lower leg: No edema.     Left lower leg: No edema.  Lymphadenopathy:     Cervical: No cervical adenopathy.  Skin:    General: Skin is warm and dry.  Neurological:     General: No focal deficit present.     Mental Status: She is alert and oriented to person, place, and time.  Psychiatric:        Mood and Affect: Mood normal.        Behavior: Behavior normal.           Assessment & Plan:

## 2022-06-03 NOTE — Patient Instructions (Signed)
Follow up with your new doctor as scheduled We'll notify you of your lab results and make any changes if needed Continue to work on healthy diet and regular exercise- you're doing great! Call with any questions or concerns Happy Holidays!!

## 2022-06-03 NOTE — Assessment & Plan Note (Signed)
New to provider, ongoing for pt.  Weight is stable.  Pt is doing home PT exercises and riding her recumbent bike.  Encouraged her to continue regular physical activity and eat a healthy diet.

## 2022-06-03 NOTE — Assessment & Plan Note (Signed)
Ongoing issue for pt.  Refill on Flonase provided.

## 2022-06-04 ENCOUNTER — Telehealth: Payer: Self-pay

## 2022-06-04 LAB — BASIC METABOLIC PANEL
BUN: 16 mg/dL (ref 6–23)
CO2: 30 mEq/L (ref 19–32)
Calcium: 9.5 mg/dL (ref 8.4–10.5)
Chloride: 102 mEq/L (ref 96–112)
Creatinine, Ser: 0.79 mg/dL (ref 0.40–1.20)
GFR: 75.16 mL/min (ref >60.00)
Glucose, Bld: 73 mg/dL (ref 70–99)
Potassium: 3.8 mEq/L (ref 3.5–5.1)
Sodium: 141 mEq/L (ref 135–145)

## 2022-06-04 LAB — LIPID PANEL
Cholesterol: 137 mg/dL (ref 0–200)
HDL: 61.6 mg/dL (ref >39.00)
LDL Cholesterol: 66 mg/dL (ref 0–99)
NonHDL: 75
Total CHOL/HDL Ratio: 2
Triglycerides: 46 mg/dL (ref 0.0–149.0)
VLDL: 9.2 mg/dL (ref 0.0–40.0)

## 2022-06-04 LAB — CBC WITH DIFFERENTIAL/PLATELET
Basophils Absolute: 0 10*3/uL (ref 0.0–0.1)
Basophils Relative: 0.6 % (ref 0.0–3.0)
Eosinophils Absolute: 0.1 10*3/uL (ref 0.0–0.7)
Eosinophils Relative: 1.3 % (ref 0.0–5.0)
HCT: 38.7 % (ref 36.0–46.0)
Hemoglobin: 12.8 g/dL (ref 12.0–15.0)
Lymphocytes Relative: 21.5 % (ref 12.0–46.0)
Lymphs Abs: 1.5 10*3/uL (ref 0.7–4.0)
MCHC: 33 g/dL (ref 30.0–36.0)
MCV: 91.1 fl (ref 78.0–100.0)
Monocytes Absolute: 0.6 10*3/uL (ref 0.1–1.0)
Monocytes Relative: 9.2 % (ref 3.0–12.0)
Neutro Abs: 4.7 10*3/uL (ref 1.4–7.7)
Neutrophils Relative %: 67.4 % (ref 43.0–77.0)
Platelets: 268 10*3/uL (ref 150.0–400.0)
RBC: 4.24 Mil/uL (ref 3.87–5.11)
RDW: 13.4 % (ref 11.5–15.5)
WBC: 6.9 10*3/uL (ref 4.0–10.5)

## 2022-06-04 LAB — HEPATIC FUNCTION PANEL
ALT: 19 U/L (ref 0–35)
AST: 21 U/L (ref 0–37)
Albumin: 4.1 g/dL (ref 3.5–5.2)
Alkaline Phosphatase: 57 U/L (ref 39–117)
Bilirubin, Direct: 0.1 mg/dL (ref 0.0–0.3)
Total Bilirubin: 0.4 mg/dL (ref 0.2–1.2)
Total Protein: 7.3 g/dL (ref 6.0–8.3)

## 2022-06-04 LAB — TSH: TSH: 2.38 u[IU]/mL (ref 0.35–5.50)

## 2022-06-04 NOTE — Telephone Encounter (Signed)
Pt seen results Via my chart  

## 2022-06-04 NOTE — Telephone Encounter (Signed)
-----   Message from Katherine E Tabori, MD sent at 06/04/2022  3:49 PM EST ----- Labs look great!  No changes at this time 

## 2022-08-31 NOTE — Progress Notes (Signed)
Office Visit Note  Patient: Frances Hunter             Date of Birth: May 28, 1951           MRN: [id number]             PCP: Maximiano Coss, NP Referring: Maximiano Coss, NP Visit Date: 09/14/2022 Occupation: @GUAROCC$ @  Subjective:  Pain in both hands  History of Present Illness: Frances Hunter is a 72 y.o. female in consultation per request of her PCP.  According the patient her symptoms started in July 2023 with right hand pain and discomfort.  She states she notices intermittent swelling in her right hand.  She has been also having difficulty making a fist with her right fourth finger.  She states when she is in the grocery stores and holding onto something tight she notices her right fourth finger turns white and cold which improves when she loses the grip.  She has not noticed similar symptoms when she is exposed to the colder temperatures.  She has noticed that the symptoms have been going on for the last 2 to 3 years.  She has mild discomfort in her left hand and also her right wrist joint.  She has been having intermittent swelling in her right knee joint for the last 1 year.  She has tried diclofenac gel and physical therapy which was helpful.  None of the other joints are painful.  There is no history of oral ulcers, nasal ulcers, sicca symptoms, malar rash, photosensitivity, lymphadenopathy.  There is no family history of autoimmune disease.  She is gravida 4, para 3, miscarriage 1.  There is no history of toxemia or preeclampsia during pregnancy.    Activities of Daily Living:  Patient reports morning stiffness for 5-10 minutes.   Patient Denies nocturnal pain.  Difficulty dressing/grooming: Denies Difficulty climbing stairs: Denies Difficulty getting out of chair: Denies Difficulty using hands for taps, buttons, cutlery, and/or writing: Denies  Review of Systems  Constitutional: Negative.  Negative for fatigue.  HENT: Negative.  Negative for mouth sores and mouth  dryness.   Eyes: Negative.  Negative for dryness.  Respiratory: Negative.  Negative for shortness of breath.   Cardiovascular: Negative.  Negative for chest pain and palpitations.  Gastrointestinal: Negative.  Negative for blood in stool, constipation and diarrhea.  Endocrine: Negative.  Negative for increased urination.  Genitourinary: Negative.  Negative for involuntary urination.  Musculoskeletal:  Positive for joint pain, joint pain, joint swelling and morning stiffness. Negative for gait problem, myalgias, muscle weakness, muscle tenderness and myalgias.  Skin:  Positive for color change. Negative for rash, hair loss and sensitivity to sunlight.  Allergic/Immunologic: Negative.  Negative for susceptible to infections.  Neurological: Negative.  Negative for dizziness and headaches.  Hematological: Negative.  Negative for swollen glands.  Psychiatric/Behavioral: Negative.  Negative for depressed mood and sleep disturbance. The patient is not nervous/anxious.     PMFS History:  Patient Active Problem List   Diagnosis Date Noted   Obesity (BMI 30-39.9) 06/03/2022   Seasonal allergies    Osteopenia    Osteopenia of thigh 07/07/2015   Chronic venous insufficiency 07/12/2014   Essential hypertension 12/10/2012   Allergic rhinitis 12/10/2012   GERD (gastroesophageal reflux disease) 12/10/2012   Varicose veins of bilateral lower extremities with pain 08/24/2008    Past Medical History:  Diagnosis Date   GERD (gastroesophageal reflux disease)    Hypertension    Osteopenia    low Frax score,  jan 2019   Seasonal allergies     Family History  Problem Relation Age of Onset   Hypertension Father    Lymphoma Father 3       lymphoma   Diabetes Brother    Hyperlipidemia Brother    Hypertension Brother    Diabetes Maternal Grandfather    Lymphoma Paternal Grandfather    Liver disease Mother 25       cyst   Hyperlipidemia Sister    Breast cancer Neg Hx    Past Surgical History:   Procedure Laterality Date   TUBAL LIGATION     Social History   Social History Narrative   Married. College: Yes. Exercise: Yes.   Immunization History  Administered Date(s) Administered   Fluad Quad(high Dose 65+) 03/27/2019, 03/27/2020, 03/27/2021   Influenza, High Dose Seasonal PF 09/25/2018, 03/26/2020, 06/03/2022   Influenza,inj,Quad PF,6+ Mos 05/17/2014, 05/22/2015, 05/27/2016, 06/11/2017   Pneumococcal Conjugate-13 05/27/2016   Pneumococcal Polysaccharide-23 02/23/2010, 06/11/2017   Tdap 02/24/2007, 05/22/2015   Zoster Recombinat (Shingrix) 05/14/2021, 09/17/2021     Objective: Vital Signs: BP (!) 125/56 (BP Location: Left Arm, Patient Position: Sitting, Cuff Size: Large)   Pulse 94   Resp 16   Ht 5' 3.5" (1.613 m)   Wt 183 lb (83 kg)   BMI 31.91 kg/m    Physical Exam Vitals and nursing note reviewed.  Constitutional:      Appearance: She is well-developed.  HENT:     Head: Normocephalic and atraumatic.  Eyes:     Conjunctiva/sclera: Conjunctivae normal.  Cardiovascular:     Rate and Rhythm: Normal rate and regular rhythm.     Heart sounds: Normal heart sounds.  Pulmonary:     Effort: Pulmonary effort is normal.     Breath sounds: Normal breath sounds.  Abdominal:     General: Bowel sounds are normal.     Palpations: Abdomen is soft.  Musculoskeletal:     Cervical back: Normal range of motion.  Lymphadenopathy:     Cervical: No cervical adenopathy.  Skin:    General: Skin is warm and dry.     Capillary Refill: Capillary refill takes less than 2 seconds.  Neurological:     Mental Status: She is alert and oriented to person, place, and time.  Psychiatric:        Behavior: Behavior normal.      Musculoskeletal Exam: Goal, thoracic and lumbar spine were in good range of motion.  Shoulder joints, elbow joints, wrist joints, MCPs PIPs and DIPs been good range of motion.  She had bilateral PIP and DIP thickening.  Mucinous cyst was noted over left third  DIP.  Thickening of flexor tendon was noted of the right second and fourth fingers.  She had right fourth trigger finger.  She had mild tenderness over right wrist joint.  No synovitis was noted.  Hip joints and knee joints in good range of motion without any warmth swelling or effusion.  There was no tenderness over ankles or MTPs.  CDAI Exam: CDAI Score: -- Patient Global: --; Provider Global: -- Swollen: --; Tender: -- Joint Exam 09/14/2022   No joint exam has been documented for this visit   There is currently no information documented on the homunculus. Go to the Rheumatology activity and complete the homunculus joint exam.  Investigation: No additional findings.  Imaging: No results found.  Recent Labs: Lab Results  Component Value Date   WBC 6.9 06/03/2022   HGB 12.8 06/03/2022   PLT  268.0 06/03/2022   NA 141 06/03/2022   K 3.8 06/03/2022   CL 102 06/03/2022   CO2 30 06/03/2022   GLUCOSE 73 06/03/2022   BUN 16 06/03/2022   CREATININE 0.79 06/03/2022   BILITOT 0.4 06/03/2022   ALKPHOS 57 06/03/2022   AST 21 06/03/2022   ALT 19 06/03/2022   PROT 7.3 06/03/2022   ALBUMIN 4.1 06/03/2022   CALCIUM 9.5 06/03/2022   GFRAA 88 03/27/2020    Speciality Comments: No specialty comments available.  Procedures:  No procedures performed Allergies: Patient has no known allergies.   Assessment / Plan:     Visit Diagnoses: Bilateral hand pain - RF negative on 02/15/22. -She has been experiencing pain and discomfort in her bilateral hands.  No synovitis was noted.  She had bilateral PIP and DIP thickening.  She gives history of intermittent swelling in her hands.  She has some discomfort in the right wrist joint.  Mild tenderness was noted over right wrist joint.  Clinical findings were consistent with osteoarthritis.  Detailed counsel regarding osteoarthritis was provided.  No synovitis was noted.  Will obtain x-rays and labs today.  Plan: XR Hand 2 View Right, XR Hand 2 View  Left, x-rays of bilateral hands were consistent with osteoarthritis.  Sedimentation rate, Cyclic citrul peptide antibody, IgG, Uric acid.  A handout on hand muscle strengthening exercises was given.  Trigger finger, right ring finger-she has been experiencing triggering of the right ring finger.  She has difficulty completely flexing her right ring finger.  Different treatment options were discussed.  I discussed the option of using Voltaren gel and all splinting or a cortisone injection.  She is leaning towards a cortisone injection because it is interfering with her routine activities.  Will schedule ultrasound-guided cortisone injection.  Information regarding trigger finger was placed in the AVS.  Chronic pain of right knee -she complains of discomfort in her right knee joint over the last 1 year.  She has also noticed intermittent swelling.  No warmth swelling or effusion was noted today.  Plan: XR KNEE 3 VIEW RIGHT.  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  A handout on lower extremity muscle strengthening exercises was given.  She has been using diclofenac gel on as needed basis.  She tried physical therapy in the past which was helpful.  Essential hypertension-blood pressure was 125/56 today.  She is on lisinopril and HCTZ.  Varicose veins of bilateral lower extremities with pain-use of compression sock with advised.  Gastroesophageal reflux disease without esophagitis-she takes Pepcid as needed.  Osteopenia of neck of right femur-Nov 30, 2019 the BMD measured at Femur Neck Right is 0.854 g/cm2 with a T-score of -1.3.  Patient exercises on a daily basis.  She uses a recumbent bike and walks.  She also does gardening when the weather permits.  Calcium rich diet and vitamin D was advised.  Seasonal allergies  Orders: Orders Placed This Encounter  Procedures   XR Hand 2 View Right   XR Hand 2 View Left   XR KNEE 3 VIEW RIGHT    Sedimentation rate   Cyclic citrul peptide antibody, IgG   Uric acid   No orders of the defined types were placed in this encounter.    Follow-Up Instructions: Return for Osteoarthritis,Trigger finger.   Bo Merino, MD  Note - This record has been created using Editor, commissioning.  Chart creation errors have been sought, but may not always  have been  located. Such creation errors do not reflect on  the standard of medical care.

## 2022-09-14 ENCOUNTER — Ambulatory Visit: Payer: Federal, State, Local not specified - PPO | Attending: Rheumatology | Admitting: Rheumatology

## 2022-09-14 ENCOUNTER — Ambulatory Visit (INDEPENDENT_AMBULATORY_CARE_PROVIDER_SITE_OTHER): Payer: Federal, State, Local not specified - PPO

## 2022-09-14 ENCOUNTER — Encounter: Payer: Self-pay | Admitting: Rheumatology

## 2022-09-14 ENCOUNTER — Ambulatory Visit: Payer: Federal, State, Local not specified - PPO

## 2022-09-14 VITALS — BP 125/56 | HR 94 | Resp 16 | Ht 63.5 in | Wt 183.0 lb

## 2022-09-14 DIAGNOSIS — I10 Essential (primary) hypertension: Secondary | ICD-10-CM

## 2022-09-14 DIAGNOSIS — M79642 Pain in left hand: Secondary | ICD-10-CM

## 2022-09-14 DIAGNOSIS — J302 Other seasonal allergic rhinitis: Secondary | ICD-10-CM

## 2022-09-14 DIAGNOSIS — M25561 Pain in right knee: Secondary | ICD-10-CM | POA: Diagnosis not present

## 2022-09-14 DIAGNOSIS — I83813 Varicose veins of bilateral lower extremities with pain: Secondary | ICD-10-CM | POA: Diagnosis not present

## 2022-09-14 DIAGNOSIS — M79641 Pain in right hand: Secondary | ICD-10-CM

## 2022-09-14 DIAGNOSIS — M85851 Other specified disorders of bone density and structure, right thigh: Secondary | ICD-10-CM | POA: Diagnosis not present

## 2022-09-14 DIAGNOSIS — G8929 Other chronic pain: Secondary | ICD-10-CM

## 2022-09-14 DIAGNOSIS — K219 Gastro-esophageal reflux disease without esophagitis: Secondary | ICD-10-CM | POA: Diagnosis not present

## 2022-09-14 DIAGNOSIS — I872 Venous insufficiency (chronic) (peripheral): Secondary | ICD-10-CM

## 2022-09-14 DIAGNOSIS — M65341 Trigger finger, right ring finger: Secondary | ICD-10-CM | POA: Diagnosis not present

## 2022-09-14 NOTE — Patient Instructions (Addendum)
Osteoarthritis  Osteoarthritis is a type of arthritis. It refers to joint pain or joint disease. Osteoarthritis affects tissue that covers the ends of bones in joints (cartilage). Cartilage acts as a cushion between the bones and helps them move smoothly. Osteoarthritis occurs when cartilage in the joints gets worn down. Osteoarthritis is sometimes called "wear and tear" arthritis. Osteoarthritis is the most common form of arthritis. It often occurs in older people. It is a condition that gets worse over time. The joints most often affected by this condition are in the fingers, toes, hips, knees, and spine, including the neck and lower back. What are the causes? This condition is caused by the wearing down of cartilage that covers the ends of bones. What increases the risk? The following factors may make you more likely to develop this condition: Being age 50 or older. Obesity. Overuse of joints. Past injury of a joint. Past surgery on a joint. Family history of osteoarthritis. What are the signs or symptoms? The main symptoms of this condition are pain, swelling, and stiffness in the joint. Other symptoms may include: An enlarged joint. More pain and further damage caused by small pieces of bone or cartilage that break off and float inside of the joint. Small deposits of bone (osteophytes) that grow on the edges of the joint. A grating or scraping feeling inside the joint when you move it. Popping or creaking sounds when you move. Difficulty walking or exercising. An inability to grip items, twist your hand(s), or control the movements of your hands and fingers. How is this diagnosed? This condition may be diagnosed based on: Your medical history. A physical exam. Your symptoms. X-rays of the affected joint(s). Blood tests to rule out other types of arthritis. How is this treated? There is no cure for this condition, but treatment can help control pain and improve joint function.  Treatment may include a combination of therapies, such as: Pain relief techniques, such as: Applying heat and cold to the joint. Massage. A form of talk therapy called cognitive behavioral therapy (CBT). This therapy helps you set goals and follow up on the changes that you make. Medicines for pain and inflammation. The medicines can be taken by mouth or applied to the skin. They include: NSAIDs, such as ibuprofen. Prescription medicines. Strong anti-inflammatory medicines (corticosteroids). Certain nutritional supplements. A prescribed exercise program. You may work with a physical therapist. Assistive devices, such as a brace, wrap, splint, specialized glove, or cane. A weight control plan. Surgery, such as: An osteotomy. This is done to reposition the bones and relieve pain or to remove loose pieces of bone and cartilage. Joint replacement surgery. You may need this surgery if you have advanced osteoarthritis. Follow these instructions at home: Activity Rest your affected joints as told by your health care provider. Exercise as told by your health care provider. He or she may recommend specific types of exercise, such as: Strengthening exercises. These are done to strengthen the muscles that support joints affected by arthritis. Aerobic activities. These are exercises, such as brisk walking or water aerobics, that increase your heart rate. Range-of-motion activities. These help your joints move more easily. Balance and agility exercises. Managing pain, stiffness, and swelling     If directed, apply heat to the affected area as often as told by your health care provider. Use the heat source that your health care provider recommends, such as a moist heat pack or a heating pad. If you have a removable assistive device, remove it   as told by your health care provider. Place a towel between your skin and the heat source. If your health care provider tells you to keep the assistive device  on while you apply heat, place a towel between the assistive device and the heat source. Leave the heat on for 20-30 minutes. Remove the heat if your skin turns bright red. This is especially important if you are unable to feel pain, heat, or cold. You may have a greater risk of getting burned. If directed, put ice on the affected area. To do this: If you have a removable assistive device, remove it as told by your health care provider. Put ice in a plastic bag. Place a towel between your skin and the bag. If your health care provider tells you to keep the assistive device on during icing, place a towel between the assistive device and the bag. Leave the ice on for 20 minutes, 2-3 times a day. Move your fingers or toes often to reduce stiffness and swelling. Raise (elevate) the injured area above the level of your heart while you are sitting or lying down. General instructions Take over-the-counter and prescription medicines only as told by your health care provider. Maintain a healthy weight. Follow instructions from your health care provider for weight control. Do not use any products that contain nicotine or tobacco, such as cigarettes, e-cigarettes, and chewing tobacco. If you need help quitting, ask your health care provider. Use assistive devices as told by your health care provider. Keep all follow-up visits as told by your health care provider. This is important. Where to find more information [employer] of Arthritis and Musculoskeletal and Skin Diseases: www.niams.SouthEx[employer].es [employer] on Aging: http://kim-miller.com/ American College of Rheumatology: www.rheumatology.org Contact a health care provider if: You have redness, swelling, or a feeling of warmth in a joint that gets worse. You have a fever along with joint or muscle aches. You develop a rash. You have trouble doing your normal activities. Get help right away if: You have pain that gets worse and is not relieved by  pain medicine. Summary Osteoarthritis is a type of arthritis that affects tissue covering the ends of bones in joints (cartilage). This condition is caused by the wearing down of cartilage that covers the ends of bones. The main symptom of this condition is pain, swelling, and stiffness in the joint. There is no cure for this condition, but treatment can help control pain and improve joint function. This information is not intended to replace advice given to you by your health care provider. Make sure you discuss any questions you have with your health care provider. Document Revised: 01/12/2022 Document Reviewed: 07/09/2019 Elsevier Patient Education  [place]. Trigger Finger  Trigger finger, also called stenosing tenosynovitis,  is a condition that causes a finger to get stuck in a bent position. Each finger has a tendon, which is a tough, cord-like tissue that connects muscle to bone, and each tendon passes through a tunnel of tissue called a tendon sheath. To move your finger, your tendon needs to glide freely through the sheath. Trigger finger happens when the tendon or the sheath thickens, making it difficult to move your finger. Trigger finger can affect any finger or a thumb. It may affect more than one finger. Mild cases may clear up with rest and medicine. Severe cases require more treatment. What are the causes? Trigger finger is caused by a thickened finger tendon or tendon sheath. The cause of this thickening is  not known. What increases the risk? The following factors may make you more likely to develop this condition: Doing activities that require a strong grip. Having rheumatoid arthritis, gout, or diabetes. Being 7-13 years old. Being female. What are the signs or symptoms? Symptoms of this condition include: Pain when bending or straightening your finger. Tenderness or swelling where your finger attaches to the palm of your hand. A lump in the palm of your hand  or on the inside of your finger. Hearing a noise like a pop or a snap when you try to straighten your finger. Feeling a catching or locking sensation when you try to straighten your finger. Being unable to straighten your finger. How is this diagnosed? This condition is diagnosed based on your symptoms and a physical exam. How is this treated? This condition may be treated by: Resting your finger and avoiding activities that make symptoms worse. Wearing a finger splint to keep your finger extended. Taking NSAIDs, such as ibuprofen, to relieve pain and swelling. Doing gentle exercises to stretch the finger as told by your health care provider. Having medicine that reduces swelling and inflammation (steroids) injected into the tendon sheath. Injections may need to be repeated. Having surgery to open the tendon sheath. This may be done if other treatments do not work and you cannot straighten your finger. You may need physical therapy after surgery. Follow these instructions at home: If you have a splint: Wear the splint as told by your health care provider. Remove it only as told by your health care provider. Loosen it if your fingers tingle, become numb, or turn cold and blue. Keep it clean. If the splint is not waterproof: Do not let it get wet. Cover it with a watertight covering when you take a bath or shower. Managing pain, stiffness, and swelling     If directed, apply heat to the affected area as often as told by your health care provider. Use the heat source that your health care provider recommends, such as a moist heat pack or a heating pad. Place a towel between your skin and the heat source. Leave the heat on for 20-30 minutes. Remove the heat if your skin turns bright red. This is especially important if you are unable to feel pain, heat, or cold. You may have a greater risk of getting burned. If directed, put ice on the painful area. To do this: If you have a removable  splint, remove it as told by your health care provider. Put ice in a plastic bag. Place a towel between your skin and the bag or between your splint and the bag. Leave the ice on for 20 minutes, 2-3 times a day.  Activity Rest your finger as told by your health care provider. Avoid activities that make the pain worse. Return to your normal activities as told by your health care provider. Ask your health care provider what activities are safe for you. Do exercises as told by your health care provider. Ask your health care provider when it is safe to drive if you have a splint on your hand. General instructions Take over-the-counter and prescription medicines only as told by your health care provider. Keep all follow-up visits as told by your health care provider. This is important. Contact a health care provider if: Your symptoms are not improving with home care. Summary Trigger finger, also called stenosing tenosynovitis, causes your finger to get stuck in a bent position. This can make it difficult and painful  to straighten your finger. This condition develops when a finger tendon or tendon sheath thickens. Treatment may include resting your finger, wearing a splint, and taking medicines. In severe cases, surgery to open the tendon sheath may be needed. This information is not intended to replace advice given to you by your health care provider. Make sure you discuss any questions you have with your health care provider. Document Revised: 11/27/2018 Document Reviewed: 11/27/2018 Elsevier Patient Education  [place]. Hand Exercises Hand exercises can be helpful for almost anyone. These exercises can strengthen the hands, improve flexibility and movement, and increase blood flow to the hands. These results can make work and daily tasks easier. Hand exercises can be especially helpful for people who have joint pain from arthritis or have nerve damage from overuse (carpal tunnel  syndrome). These exercises can also help people who have injured a hand. Exercises Most of these hand exercises are gentle stretching and motion exercises. It is usually safe to do them often throughout the day. Warming up your hands before exercise may help to reduce stiffness. You can do this with gentle massage or by placing your hands in warm water for 10-15 minutes. It is normal to feel some stretching, pulling, tightness, or mild discomfort as you begin new exercises. This will gradually improve. Stop an exercise right away if you feel sudden, severe pain or your pain gets worse. Ask your health care provider which exercises are best for you. Knuckle bend or "claw" fist  Stand or sit with your arm, hand, and all five fingers pointed straight up. Make sure to keep your wrist straight during the exercise. Gently bend your fingers down toward your palm until the tips of your fingers are touching the top of your palm. Keep your big knuckle straight and just bend the small knuckles in your fingers. Hold this position for __________ seconds. Straighten (extend) your fingers back to the starting position. Repeat this exercise 5-10 times with each hand. Full finger fist  Stand or sit with your arm, hand, and all five fingers pointed straight up. Make sure to keep your wrist straight during the exercise. Gently bend your fingers into your palm until the tips of your fingers are touching the middle of your palm. Hold this position for __________ seconds. Extend your fingers back to the starting position, stretching every joint fully. Repeat this exercise 5-10 times with each hand. Straight fist Stand or sit with your arm, hand, and all five fingers pointed straight up. Make sure to keep your wrist straight during the exercise. Gently bend your fingers at the big knuckle, where your fingers meet your hand, and the middle knuckle. Keep the knuckle at the tips of your fingers straight and try to touch  the bottom of your palm. Hold this position for __________ seconds. Extend your fingers back to the starting position, stretching every joint fully. Repeat this exercise 5-10 times with each hand. Tabletop  Stand or sit with your arm, hand, and all five fingers pointed straight up. Make sure to keep your wrist straight during the exercise. Gently bend your fingers at the big knuckle, where your fingers meet your hand, as far down as you can while keeping the small knuckles in your fingers straight. Think of forming a tabletop with your fingers. Hold this position for __________ seconds. Extend your fingers back to the starting position, stretching every joint fully. Repeat this exercise 5-10 times with each hand. Finger spread  Place your hand flat  on a table with your palm facing down. Make sure your wrist stays straight as you do this exercise. Spread your fingers and thumb apart from each other as far as you can until you feel a gentle stretch. Hold this position for __________ seconds. Bring your fingers and thumb tight together again. Hold this position for __________ seconds. Repeat this exercise 5-10 times with each hand. Making circles  Stand or sit with your arm, hand, and all five fingers pointed straight up. Make sure to keep your wrist straight during the exercise. Make a circle by touching the tip of your thumb to the tip of your index finger. Hold for __________ seconds. Then open your hand wide. Repeat this motion with your thumb and each finger on your hand. Repeat this exercise 5-10 times with each hand. Thumb motion  Sit with your forearm resting on a table and your wrist straight. Your thumb should be facing up toward the ceiling. Keep your fingers relaxed as you move your thumb. Lift your thumb up as high as you can toward the ceiling. Hold for __________ seconds. Bend your thumb across your palm as far as you can, reaching the tip of your thumb for the small finger  (pinkie) side of your palm. Hold for __________ seconds. Repeat this exercise 5-10 times with each hand. Grip strengthening  Hold a stress ball or other soft ball in the middle of your hand. Slowly increase the pressure, squeezing the ball as much as you can without causing pain. Think of bringing the tips of your fingers into the middle of your palm. All of your finger joints should bend when doing this exercise. Hold your squeeze for __________ seconds, then relax. Repeat this exercise 5-10 times with each hand. Contact a health care provider if: Your hand pain or discomfort gets much worse when you do an exercise. Your hand pain or discomfort does not improve within 2 hours after you exercise. If you have any of these problems, stop doing these exercises right away. Do not do them again unless your health care provider says that you can. Get help right away if: You develop sudden, severe hand pain or swelling. If this happens, stop doing these exercises right away. Do not do them again unless your health care provider says that you can. This information is not intended to replace advice given to you by your health care provider. Make sure you discuss any questions you have with your health care provider. Document Revised: 10/30/2020 Document Reviewed: 10/30/2020 Elsevier Patient Education  [place]. Pain in both hands exercises for Chronic Knee Pain Chronic knee pain is pain that lasts longer than 3 months. For most people with chronic knee pain, exercise and weight loss is an important part of treatment. Your health care provider may want you to focus on: Strengthening the muscles that support your knee. This can take pressure off your knee and lessen pain. Preventing knee stiffness. Maintaining or increasing how far you can move your knee. Losing weight (if this applies) to take pressure off your knee, decrease your risk for injury, and make it easier for you to exercise. Your  health care provider will help you develop an exercise program that matches your needs and physical abilities. Below are simple, low-impact exercises you can do at home. Ask your health care provider or a physical therapist how often you should do your exercise program and how many times to repeat each exercise. General safety tips Follow these safety  tips for exercising with chronic knee pain: Get your health care provider's approval before doing any exercises. Start slowly and stop any time an exercise causes pain. Do not exercise if your knee pain is flaring up. Warm up first. Stretching a cold muscle can cause an injury. Do 5-10 minutes of easy movement or light stretching before beginning your exercise routine. Do 5-10 minutes of low-impact activity (like walking or cycling) before starting strengthening exercises. Contact your health care provider any time you have pain during or after exercising. Exercise may cause discomfort but should not be painful. It is normal to be a little stiff or sore after exercising.  Stretching and range-of-motion exercises Front thigh stretch  Stand up straight and support your body by holding on to a chair or resting one hand on a wall. With your legs straight and close together, bend one knee to lift your heel up toward your buttocks. Using one hand for support, grab your ankle with your free hand. Pull your foot up closer toward your buttocks to feel the stretch in front of your thigh. Hold the stretch for 30 seconds. Repeat __________ times. Complete this exercise __________ times a day. Back thigh stretch  Sit on the floor with your back straight and your legs out straight in front of you. Place the palms of your hands on the floor and slide them toward your feet as you bend at the hip. Try to touch your nose to your knees and feel the stretch in the back of your thighs. Hold for 30 seconds. Repeat __________ times. Complete this exercise __________  times a day. Calf stretch  Stand facing a wall. Place the palms of your hands flat against the wall, arms extended, and lean slightly against the wall. Get into a lunge position with one leg bent at the knee and the other leg stretched out straight behind you. Keep both feet facing the wall and increase the bend in your knee while keeping the heel of the other leg flat on the ground. You should feel the stretch in your calf. Hold for 30 seconds. Repeat __________ times. Complete this exercise __________ times a day. Strengthening exercises Straight leg lift Lie on your back with one knee bent and the other leg out straight. Slowly lift the straight leg without bending the knee. Lift until your foot is about 12 inches (30 cm) off the floor. Hold for 3-5 seconds and slowly lower your leg. Repeat __________ times. Complete this exercise __________ times a day. Single leg dip Stand between two chairs and put both hands on the backs of the chairs for support. Extend one leg out straight with your body weight resting on the heel of the standing leg. Slowly bend your standing knee to dip your body to the level that is comfortable for you. Hold for 3-5 seconds. Repeat __________ times. Complete this exercise __________ times a day. Hamstring curls Stand straight, knees close together, facing the back of a chair. Hold on to the back of a chair with both hands. Keep one leg straight. Bend the other knee while bringing the heel up toward the buttock until the knee is bent at a 90-degree angle (right angle). Hold for 3-5 seconds. Repeat __________ times. Complete this exercise __________ times a day. Wall squat Stand straight with your back, hips, and head against a wall. Step forward one foot at a time with your back still against the wall. Your feet should be 2 feet (61 cm) from the  wall at shoulder width. Keeping your back, hips, and head against the wall, slide down the wall to as close of a  sitting position as you can get. Hold for 5-10 seconds, then slowly slide back up. Repeat __________ times. Complete this exercise __________ times a day. Step-ups Step up with one foot onto a sturdy platform or stool that is about 6 inches (15 cm) high. Face sideways with one foot on the platform and one on the ground. Place all your weight on the platform foot and lift your body off the ground until your knee extends. Let your other leg hang free to the side. Hold for 3-5 seconds then slowly lower your weight down to the floor foot. Repeat __________ times. Complete this exercise __________ times a day. Contact a health care provider if: Your exercise causes pain. Your pain is worse after you exercise. Your pain prevents you from doing your exercises. This information is not intended to replace advice given to you by your health care provider. Make sure you discuss any questions you have with your health care provider. Document Revised: 11/15/2019 Document Reviewed: 07/09/2019 Elsevier Patient Education  [place].

## 2022-09-16 LAB — CYCLIC CITRUL PEPTIDE ANTIBODY, IGG: Cyclic Citrullin Peptide Ab: <16 UNITS

## 2022-09-16 LAB — SEDIMENTATION RATE: Sed Rate: 45 mm/h — ABNORMAL HIGH (ref 0–30)

## 2022-09-16 LAB — URIC ACID: Uric Acid, Serum: 5.6 mg/dL (ref 2.5–7.0)

## 2022-09-16 NOTE — Progress Notes (Signed)
Sedimentation rate is elevated which indicates inflammation.  Anti-CCP antibody for rheumatoid arthritis was negative.  Uric acid was within normal limits.  I would suggest getting ultrasound of bilateral hands to evaluate for inflammation.  If patient is in agreement please schedule ultrasound of bilateral hands.

## 2022-09-21 NOTE — Progress Notes (Signed)
Office Visit Note  Patient: Frances Hunter             Date of Birth: [birth year]           MRN: [id number]             PCP: Maximiano Coss, NP Referring: Maximiano Coss, NP Visit Date: 10/05/2022 Occupation: @GUAROCC @  Subjective:  Right knee pain  History of Present Illness: Frances Hunter is a 72 y.o. female with osteoarthritis.  She returns for a follow-up visit today.  She states that she had good response to right trigger ring finger injection.  She states her finger is not locking anymore.  She continues to have some stiffness in her hands which is tolerable.  She has not noticed any joint swelling.  She continues to have discomfort in her right knee joint.  She notices intermittent swelling in her right knee joint.  None of the other joints are painful.  She denies any history of oral ulcers, nasal ulcers, malar rash, photosensitivity or Raynaud's phenomenon.    Activities of Daily Living:  Patient reports morning stiffness for 0 minute.   Patient Denies nocturnal pain.  Difficulty dressing/grooming: Denies Difficulty climbing stairs: Denies Difficulty getting out of chair: Denies Difficulty using hands for taps, buttons, cutlery, and/or writing: Denies  Review of Systems  Constitutional:  Negative for fatigue.  HENT:  Negative for mouth sores and mouth dryness.   Eyes:  Negative for dryness.  Respiratory:  Negative for shortness of breath.   Cardiovascular:  Negative for chest pain and palpitations.  Gastrointestinal:  Negative for blood in stool, constipation and diarrhea.  Endocrine: Negative for increased urination.  Genitourinary:  Negative for involuntary urination.  Musculoskeletal:  Positive for joint pain, joint pain and joint swelling. Negative for gait problem, myalgias, muscle weakness, morning stiffness, muscle tenderness and myalgias.  Skin:  Negative for color change, rash, hair loss and sensitivity to sunlight.  Allergic/Immunologic: Negative for  susceptible to infections.  Neurological:  Negative for dizziness and headaches.  Hematological:  Negative for swollen glands.  Psychiatric/Behavioral:  Negative for depressed mood and sleep disturbance. The patient is not nervous/anxious.     PMFS History:  Patient Active Problem List   Diagnosis Date Noted   Obesity (BMI 30-39.9) 06/03/2022   Seasonal allergies    Osteopenia    Osteopenia of thigh 07/07/2015   Chronic venous insufficiency 07/12/2014   Essential hypertension 12/10/2012   Allergic rhinitis 12/10/2012   GERD (gastroesophageal reflux disease) 12/10/2012   Varicose veins of bilateral lower extremities with pain 08/24/2008    Past Medical History:  Diagnosis Date   GERD (gastroesophageal reflux disease)    Hypertension    Osteopenia    low Frax score, jan 2019   Seasonal allergies     Family History  Problem Relation Age of Onset   Hypertension Father    Lymphoma Father 82       lymphoma   Diabetes Brother    Hyperlipidemia Brother    Hypertension Brother    Diabetes Maternal Grandfather    Lymphoma Paternal Grandfather    Liver disease Mother 19       cyst   Hyperlipidemia Sister    Breast cancer Neg Hx    Past Surgical History:  Procedure Laterality Date   TUBAL LIGATION     Social History   Social History Narrative   Married. College: Yes. Exercise: Yes.   Immunization History  Administered Date(s) Administered   [employer]  Quad(high Dose 65+) 03/27/2019, 03/27/2020, 03/27/2021   Influenza, High Dose Seasonal PF 09/25/2018, 03/26/2020, 06/03/2022   Influenza,inj,Quad PF,6+ Mos 05/17/2014, 05/22/2015, 05/27/2016, 06/11/2017   Pneumococcal Conjugate-13 05/27/2016   Pneumococcal Polysaccharide-23 02/23/2010, 06/11/2017   Tdap 02/24/2007, 05/22/2015   Zoster Recombinat (Shingrix) 05/14/2021, 09/17/2021     Objective: Vital Signs: BP 126/69 (BP Location: Left Arm, Patient Position: Sitting, Cuff Size: Normal)   Pulse 85   Resp 14   Ht 5' 3.5"  (1.613 m)   Wt 172 lb (78 kg)   BMI 29.99 kg/m    Physical Exam Vitals and nursing note reviewed.  Constitutional:      Appearance: She is well-developed.  HENT:     Head: Normocephalic and atraumatic.  Eyes:     Conjunctiva/sclera: Conjunctivae normal.  Cardiovascular:     Rate and Rhythm: Normal rate and regular rhythm.     Heart sounds: Normal heart sounds.  Pulmonary:     Effort: Pulmonary effort is normal.     Breath sounds: Normal breath sounds.  Abdominal:     General: Bowel sounds are normal.     Palpations: Abdomen is soft.  Musculoskeletal:     Cervical back: Normal range of motion.  Lymphadenopathy:     Cervical: No cervical adenopathy.  Skin:    General: Skin is warm and dry.     Capillary Refill: Capillary refill takes less than 2 seconds.  Neurological:     Mental Status: She is alert and oriented to person, place, and time.  Psychiatric:        Behavior: Behavior normal.      Musculoskeletal Exam: Cervical, thoracic and lumbar spine were in good range of motion.  Shoulder joints, elbow joints, wrist joints, MCPs PIPs and DIPs with good range of motion.  She had bilateral CMC PIP and DIP thickening with no synovitis.  Flexor tendon thickening of the right ring finger has resolved.  She had good range of motion of her hip joints.  She had crepitus and discomfort with range of motion of her right knee joint.  No warmth swelling or effusion was noted.  There was no tenderness over ankles or MTPs.  CDAI Exam: CDAI Score: -- Patient Global: --; Provider Global: -- Swollen: --; Tender: -- Joint Exam 10/05/2022   No joint exam has been documented for this visit   There is currently no information documented on the homunculus. Go to the Rheumatology activity and complete the homunculus joint exam.  Investigation: No additional findings.  Imaging: US Guided Needle Placement  Result Date: 09/22/2022 Ultrasound guided injection is preferred based studies that  show increased duration, increased effect, greater accuracy, decreased procedural pain, increased response rate, and decreased cost with ultrasound guided versus blind injection.   Verbal informed consent obtained.  Time-out conducted.  Noted no overlying erythema, induration, or other signs of local infection. Ultrasound-guided trigger finger injection: After sterile prep with Betadine, injected 0.5 mL of 1% lidocaine and 10 mg Kenalog using a 27 needle, and the flexor tendon sheath.    XR KNEE 3 VIEW RIGHT  Result Date: 09/14/2022 Moderate to severe lateral compartment narrowing with medial, intercondylar and lateral osteophytes was noted.  Severe patellofemoral narrowing was noted.  No chondrocalcinosis was noted. Impression: These findings are consistent with moderate to severe osteoarthritis and severe chondromalacia patella.  XR Hand 2 View Left  Result Date: 09/14/2022 CMC, PIP and DIP narrowing was noted.  No intercarpal or radiocarpal joint space narrowing was noted.  No  erosive changes were noted. Impression: These findings are consistent with osteoarthritis of the hand.  XR Hand 2 View Right  Result Date: 09/14/2022 CMC, PIP and DIP narrowing was noted.  No intercarpal or radiocarpal joint space narrowing was noted.  No erosive changes were noted. Impression: These findings are consistent with osteoarthritis of the hand.   Recent Labs: Lab Results  Component Value Date   WBC 6.9 06/03/2022   HGB 12.8 06/03/2022   PLT 268.0 06/03/2022   NA 141 06/03/2022   K 3.8 06/03/2022   CL 102 06/03/2022   CO2 30 06/03/2022   GLUCOSE 73 06/03/2022   BUN 16 06/03/2022   CREATININE 0.79 06/03/2022   BILITOT 0.4 06/03/2022   ALKPHOS 57 06/03/2022   AST 21 06/03/2022   ALT 19 06/03/2022   PROT 7.3 06/03/2022   ALBUMIN 4.1 06/03/2022   CALCIUM 9.5 06/03/2022   GFRAA 88 03/27/2020   September 22, 2022 ESR 45, anti-CCP negative, uric acid 5.6 February 15, 2022 RF negative  Speciality  Comments: No specialty comments available.  Procedures:  Large Joint Inj: R knee on 10/05/2022 3:54 PM Indications: pain Details: 27 G 1.5 in needle, medial approach  Arthrogram: No  Medications: 60 mg triamcinolone acetonide 40 MG/ML; 3 mL lidocaine 1 % Aspirate: 0 mL Outcome: tolerated well, no immediate complications Procedure, treatment alternatives, risks and benefits explained, specific risks discussed. Consent was given by the patient. Immediately prior to procedure a time out was called to verify the correct patient, procedure, equipment, support staff and site/side marked as required. Patient was prepped and draped in the usual sterile fashion.     Allergies: Patient has no known allergies.   Assessment / Plan:     Visit Diagnoses: Primary osteoarthritis of both hands - History of pain in both hands and wrist joints.  No synovitis noted.  Clinical and radiographic findings are consistent with osteoarthritis.  X-ray findings were reviewed with the patient.  Detailed counsel regarding osteoarthritis was provided.  Joint protection muscle strengthening was discussed.  Rheumatoid factor and anti-CCP was negative.  Uric acid was normal.  Labs were reviewed with the patient.  Her sedimentation rate was elevated at 45.  Elevated sed rate-patient states that she had an upper respiratory tract infection at the last visit when her sed rate was elevated.  I advised her to contact me if she develops any joint swelling and will repeat the sedimentation rate.  I do not see the need to repeat sedimentation rate today.  Trigger finger, right ring finger - trigger finger injection 09/22/22.  She had good response to trigger finger injection.  No flexor tendon thickening was noted today.  She was able to make complete fist.  Primary osteoarthritis of both knees - Moderate to severe lateral compartment narrowing consistent with osteoarthritis and severe chondromalacia patella was noted in the right  knee joint.  X-ray findings were reviewed with the patient.  She states she is not ready for total knee replacement.  She continues to have some discomfort in her knee joint.  No warmth swelling or effusion was noted.  Different treatment options and their side effects were discussed.  Patient wanted to proceed with the cortisone injection.  After informed consent was obtained right knee joint was injected with lidocaine and cortisone as described above.  Patient tolerated the procedure well.  Postprocedure instructions were given.  She was advised to continue to do lower extremity exercises.  Use of sleeve brace was also advised.  I also discussed possibility of viscosupplement injections in the future if she continues to have discomfort after cortisone injection.  Osteopenia of neck of right femur - Nov 30, 2019 the BMD measured at Femur Neck Right is 0.854 g/cm2 with a T-score of -1.3.  Use of calcium rich diet and vitamin D was advised.  Essential hypertension-her blood pressure with normal at 126/69.  She takes lisinopril and HCTZ.  Gastroesophageal reflux disease without esophagitis  Varicose veins of bilateral lower extremities with pain  Seasonal allergies  Orders: Orders Placed This Encounter  Procedures   Large Joint Inj   No orders of the defined types were placed in this encounter.    Follow-Up Instructions: Return in about 4 months (around 02/04/2023) for Osteoarthritis.   Bo Merino, MD  Note - This record has been created using Editor, commissioning.  Chart creation errors have been sought, but may not always  have been located. Such creation errors do not reflect on  the standard of medical care.

## 2022-09-22 ENCOUNTER — Ambulatory Visit: Payer: Federal, State, Local not specified - PPO

## 2022-09-22 ENCOUNTER — Ambulatory Visit: Payer: Federal, State, Local not specified - PPO | Attending: Rheumatology | Admitting: Rheumatology

## 2022-09-22 DIAGNOSIS — M65341 Trigger finger, right ring finger: Secondary | ICD-10-CM | POA: Diagnosis not present

## 2022-09-22 MED ORDER — LIDOCAINE HCL 1 % IJ SOLN
0.5000 mL | INTRAMUSCULAR | Status: AC | PRN
  Administered 2022-09-22: .5 mL

## 2022-09-22 MED ORDER — TRIAMCINOLONE ACETONIDE 40 MG/ML IJ SUSP
10.0000 mg | INTRAMUSCULAR | Status: AC | PRN
  Administered 2022-09-22: 10 mg

## 2022-09-22 NOTE — Progress Notes (Signed)
   Procedure Note  Patient: Frances Hunter             Date of Birth: [birth year]           MRN: [id number]             Visit Date: 09/22/2022  Procedures: Visit Diagnoses:  1. Trigger finger, right ring finger    Ultrasound guided injection is preferred based studies that show increased duration, increased effect, greater accuracy, decreased procedural pain, increased response rate, and decreased cost with ultrasound guided versus blind injection.   Verbal informed consent obtained.  Time-out conducted.  Noted no overlying erythema, induration, or other signs of local infection. Ultrasound-guided trigger finger injection: After sterile prep with Betadine, injected 0.5 mL of 1% lidocaine and 10 mg Kenalog using a 27 needle, and the flexor tendon sheath.    Hand/UE Inj: R ring A1 for trigger finger on 09/22/2022 9:13 AM Indications: pain, tendon swelling and therapeutic Details: 27 G needle, ultrasound-guided volar approach Medications: 0.5 mL lidocaine 1 %; 10 mg triamcinolone acetonide 40 MG/ML Aspirate: 0 mL Consent was given by the patient. Immediately prior to procedure a time out was called to verify the correct patient, procedure, equipment, support staff and site/side marked as required. Patient was prepped and draped in the usual sterile fashion.    Patient tolerated the procedure well.  Postprocedure instructions were given.  A splint was applied.  Bo Merino, MD

## 2022-10-05 ENCOUNTER — Encounter: Payer: Self-pay | Admitting: Rheumatology

## 2022-10-05 ENCOUNTER — Ambulatory Visit: Payer: Federal, State, Local not specified - PPO | Attending: Rheumatology | Admitting: Rheumatology

## 2022-10-05 VITALS — BP 126/69 | HR 85 | Resp 14 | Ht 63.5 in | Wt 172.0 lb

## 2022-10-05 DIAGNOSIS — I83813 Varicose veins of bilateral lower extremities with pain: Secondary | ICD-10-CM | POA: Diagnosis not present

## 2022-10-05 DIAGNOSIS — I10 Essential (primary) hypertension: Secondary | ICD-10-CM

## 2022-10-05 DIAGNOSIS — M65341 Trigger finger, right ring finger: Secondary | ICD-10-CM

## 2022-10-05 DIAGNOSIS — J302 Other seasonal allergic rhinitis: Secondary | ICD-10-CM

## 2022-10-05 DIAGNOSIS — M170 Bilateral primary osteoarthritis of knee: Secondary | ICD-10-CM

## 2022-10-05 DIAGNOSIS — M19041 Primary osteoarthritis, right hand: Secondary | ICD-10-CM

## 2022-10-05 DIAGNOSIS — M19042 Primary osteoarthritis, left hand: Secondary | ICD-10-CM | POA: Diagnosis not present

## 2022-10-05 DIAGNOSIS — M85851 Other specified disorders of bone density and structure, right thigh: Secondary | ICD-10-CM

## 2022-10-05 DIAGNOSIS — K219 Gastro-esophageal reflux disease without esophagitis: Secondary | ICD-10-CM | POA: Diagnosis not present

## 2022-10-05 DIAGNOSIS — R700 Elevated erythrocyte sedimentation rate: Secondary | ICD-10-CM | POA: Diagnosis not present

## 2022-10-05 MED ORDER — TRIAMCINOLONE ACETONIDE 40 MG/ML IJ SUSP
60.0000 mg | INTRAMUSCULAR | Status: AC | PRN
  Administered 2022-10-05: 60 mg via INTRA_ARTICULAR

## 2022-10-05 MED ORDER — LIDOCAINE HCL 1 % IJ SOLN
3.0000 mL | INTRAMUSCULAR | Status: AC | PRN
  Administered 2022-10-05: 3 mL

## 2022-10-05 NOTE — Patient Instructions (Addendum)
Hand Exercises Hand exercises can be helpful for almost anyone. These exercises can strengthen the hands, improve flexibility and movement, and increase blood flow to the hands. These results can make work and daily tasks easier. Hand exercises can be especially helpful for people who have joint pain from arthritis or have nerve damage from overuse (carpal tunnel syndrome). These exercises can also help people who have injured a hand. Exercises Most of these hand exercises are gentle stretching and motion exercises. It is usually safe to do them often throughout the day. Warming up your hands before exercise may help to reduce stiffness. You can do this with gentle massage or by placing your hands in warm water for 10-15 minutes. It is normal to feel some stretching, pulling, tightness, or mild discomfort as you begin new exercises. This will gradually improve. Stop an exercise right away if you feel sudden, severe pain or your pain gets worse. Ask your health care provider which exercises are best for you. Knuckle bend or "claw" fist  Stand or sit with your arm, hand, and all five fingers pointed straight up. Make sure to keep your wrist straight during the exercise. Gently bend your fingers down toward your palm until the tips of your fingers are touching the top of your palm. Keep your big knuckle straight and just bend the small knuckles in your fingers. Hold this position for __________ seconds. Straighten (extend) your fingers back to the starting position. Repeat this exercise 5-10 times with each hand. Full finger fist  Stand or sit with your arm, hand, and all five fingers pointed straight up. Make sure to keep your wrist straight during the exercise. Gently bend your fingers into your palm until the tips of your fingers are touching the middle of your palm. Hold this position for __________ seconds. Extend your fingers back to the starting position, stretching every joint fully. Repeat  this exercise 5-10 times with each hand. Straight fist Stand or sit with your arm, hand, and all five fingers pointed straight up. Make sure to keep your wrist straight during the exercise. Gently bend your fingers at the big knuckle, where your fingers meet your hand, and the middle knuckle. Keep the knuckle at the tips of your fingers straight and try to touch the bottom of your palm. Hold this position for __________ seconds. Extend your fingers back to the starting position, stretching every joint fully. Repeat this exercise 5-10 times with each hand. Tabletop  Stand or sit with your arm, hand, and all five fingers pointed straight up. Make sure to keep your wrist straight during the exercise. Gently bend your fingers at the big knuckle, where your fingers meet your hand, as far down as you can while keeping the small knuckles in your fingers straight. Think of forming a tabletop with your fingers. Hold this position for __________ seconds. Extend your fingers back to the starting position, stretching every joint fully. Repeat this exercise 5-10 times with each hand. Finger spread  Place your hand flat on a table with your palm facing down. Make sure your wrist stays straight as you do this exercise. Spread your fingers and thumb apart from each other as far as you can until you feel a gentle stretch. Hold this position for __________ seconds. Bring your fingers and thumb tight together again. Hold this position for __________ seconds. Repeat this exercise 5-10 times with each hand. Making circles  Stand or sit with your arm, hand, and all five fingers pointed  straight up. Make sure to keep your wrist straight during the exercise. Make a circle by touching the tip of your thumb to the tip of your index finger. Hold for __________ seconds. Then open your hand wide. Repeat this motion with your thumb and each finger on your hand. Repeat this exercise 5-10 times with each hand. Thumb  motion  Sit with your forearm resting on a table and your wrist straight. Your thumb should be facing up toward the ceiling. Keep your fingers relaxed as you move your thumb. Lift your thumb up as high as you can toward the ceiling. Hold for __________ seconds. Bend your thumb across your palm as far as you can, reaching the tip of your thumb for the small finger (pinkie) side of your palm. Hold for __________ seconds. Repeat this exercise 5-10 times with each hand. Grip strengthening  Hold a stress ball or other soft ball in the middle of your hand. Slowly increase the pressure, squeezing the ball as much as you can without causing pain. Think of bringing the tips of your fingers into the middle of your palm. All of your finger joints should bend when doing this exercise. Hold your squeeze for __________ seconds, then relax. Repeat this exercise 5-10 times with each hand. Contact a health care provider if: Your hand pain or discomfort gets much worse when you do an exercise. Your hand pain or discomfort does not improve within 2 hours after you exercise. If you have any of these problems, stop doing these exercises right away. Do not do them again unless your health care provider says that you can. Get help right away if: You develop sudden, severe hand pain or swelling. If this happens, stop doing these exercises right away. Do not do them again unless your health care provider says that you can. This information is not intended to replace advice given to you by your health care provider. Make sure you discuss any questions you have with your health care provider. Document Revised: 10/23/2020 Document Reviewed: 10/30/2020 Elsevier Patient Education  [place]. Exercises for Chronic Knee Pain Chronic knee pain is pain that lasts longer than 3 months. For most people with chronic knee pain, exercise and weight loss is an important part of treatment. Your health care provider may want  you to focus on: Strengthening the muscles that support your knee. This can take pressure off your knee and lessen pain. Preventing knee stiffness. Maintaining or increasing how far you can move your knee. Losing weight (if this applies) to take pressure off your knee, decrease your risk for injury, and make it easier for you to exercise. Your health care provider will help you develop an exercise program that matches your needs and physical abilities. Below are simple, low-impact exercises you can do at home. Ask your health care provider or a physical therapist how often you should do your exercise program and how many times to repeat each exercise. General safety tips Follow these safety tips for exercising with chronic knee pain: Get your health care provider's approval before doing any exercises. Start slowly and stop any time an exercise causes pain. Do not exercise if your knee pain is flaring up. Warm up first. Stretching a cold muscle can cause an injury. Do 5-10 minutes of easy movement or light stretching before beginning your exercise routine. Do 5-10 minutes of low-impact activity (like walking or cycling) before starting strengthening exercises. Contact your health care provider any time you have pain  during or after exercising. Exercise may cause discomfort but should not be painful. It is normal to be a little stiff or sore after exercising.  Stretching and range-of-motion exercises Front thigh stretch  Stand up straight and support your body by holding on to a chair or resting one hand on a wall. With your legs straight and close together, bend one knee to lift your heel up toward your buttocks. Using one hand for support, grab your ankle with your free hand. Pull your foot up closer toward your buttocks to feel the stretch in front of your thigh. Hold the stretch for 30 seconds. Repeat __________ times. Complete this exercise __________ times a day. Back thigh stretch  Sit  on the floor with your back straight and your legs out straight in front of you. Place the palms of your hands on the floor and slide them toward your feet as you bend at the hip. Try to touch your nose to your knees and feel the stretch in the back of your thighs. Hold for 30 seconds. Repeat __________ times. Complete this exercise __________ times a day. Calf stretch  Stand facing a wall. Place the palms of your hands flat against the wall, arms extended, and lean slightly against the wall. Get into a lunge position with one leg bent at the knee and the other leg stretched out straight behind you. Keep both feet facing the wall and increase the bend in your knee while keeping the heel of the other leg flat on the ground. You should feel the stretch in your calf. Hold for 30 seconds. Repeat __________ times. Complete this exercise __________ times a day. Strengthening exercises Straight leg lift Lie on your back with one knee bent and the other leg out straight. Slowly lift the straight leg without bending the knee. Lift until your foot is about 12 inches (30 cm) off the floor. Hold for 3-5 seconds and slowly lower your leg. Repeat __________ times. Complete this exercise __________ times a day. Single leg dip Stand between two chairs and put both hands on the backs of the chairs for support. Extend one leg out straight with your body weight resting on the heel of the standing leg. Slowly bend your standing knee to dip your body to the level that is comfortable for you. Hold for 3-5 seconds. Repeat __________ times. Complete this exercise __________ times a day. Hamstring curls Stand straight, knees close together, facing the back of a chair. Hold on to the back of a chair with both hands. Keep one leg straight. Bend the other knee while bringing the heel up toward the buttock until the knee is bent at a 90-degree angle (right angle). Hold for 3-5 seconds. Repeat __________ times.  Complete this exercise __________ times a day. Wall squat Stand straight with your back, hips, and head against a wall. Step forward one foot at a time with your back still against the wall. Your feet should be 2 feet (61 cm) from the wall at shoulder width. Keeping your back, hips, and head against the wall, slide down the wall to as close of a sitting position as you can get. Hold for 5-10 seconds, then slowly slide back up. Repeat __________ times. Complete this exercise __________ times a day. Step-ups Step up with one foot onto a sturdy platform or stool that is about 6 inches (15 cm) high. Face sideways with one foot on the platform and one on the ground. Place all your weight on  the platform foot and lift your body off the ground until your knee extends. Let your other leg hang free to the side. Hold for 3-5 seconds then slowly lower your weight down to the floor foot. Repeat __________ times. Complete this exercise __________ times a day. Contact a health care provider if: Your exercise causes pain. Your pain is worse after you exercise. Your pain prevents you from doing your exercises. This information is not intended to replace advice given to you by your health care provider. Make sure you discuss any questions you have with your health care provider. Document Revised: 11/15/2019 Document Reviewed: 07/09/2019 Elsevier Patient Education  [place]. Osteoarthritis  Osteoarthritis is a type of arthritis. It refers to joint pain or joint disease. Osteoarthritis affects tissue that covers the ends of bones in joints (cartilage). Cartilage acts as a cushion between the bones and helps them move smoothly. Osteoarthritis occurs when cartilage in the joints gets worn down. Osteoarthritis is sometimes called "wear and tear" arthritis. Osteoarthritis is the most common form of arthritis. It often occurs in older people. It is a condition that gets worse over time. The joints most  often affected by this condition are in the fingers, toes, hips, knees, and spine, including the neck and lower back. What are the causes? This condition is caused by the wearing down of cartilage that covers the ends of bones. What increases the risk? The following factors may make you more likely to develop this condition: Being age 46 or older. Obesity. Overuse of joints. Past injury of a joint. Past surgery on a joint. Family history of osteoarthritis. What are the signs or symptoms? The main symptoms of this condition are pain, swelling, and stiffness in the joint. Other symptoms may include: An enlarged joint. More pain and further damage caused by small pieces of bone or cartilage that break off and float inside of the joint. Small deposits of bone (osteophytes) that grow on the edges of the joint. A grating or scraping feeling inside the joint when you move it. Popping or creaking sounds when you move. Difficulty walking or exercising. An inability to grip items, twist your hand, or control the movements of your hands and fingers. How is this diagnosed? This condition may be diagnosed based on: Your medical history. A physical exam. Your symptoms. X-rays of the affected joints. Blood tests to rule out other types of arthritis. How is this treated? There is no cure for this condition, but treatment can help control pain and improve joint function. Treatment may include a combination of therapies, such as: Pain relief techniques, such as: Applying heat and cold to the joint. Massage. A form of talk therapy called cognitive behavioral therapy (CBT). This therapy helps you set goals and follow up on the changes that you make. Medicines for pain and inflammation. The medicines can be taken by mouth or applied to the skin. They include: NSAIDs, such as ibuprofen. Prescription medicines. Strong anti-inflammatory medicines (corticosteroids). Certain nutritional supplements. A  prescribed exercise program. You may work with a physical therapist. Assistive devices, such as a brace, wrap, splint, specialized glove, or cane. A weight control plan. Surgery, such as: An osteotomy. This is done to reposition the bones and relieve pain or to remove loose pieces of bone and cartilage. Joint replacement surgery. You may need this surgery if you have advanced osteoarthritis. Follow these instructions at home: Activity Rest your affected joints as told by your health care provider. Exercise as told by  your provider. The provider may recommend specific types of exercise, such as: Strengthening exercises. These are done to strengthen the muscles that support joints affected by arthritis. Aerobic activities. These are exercises, such as brisk walking or water aerobics, that increase your heart rate. Range-of-motion activities. These help your joints move more easily. Balance and agility exercises. Managing pain, stiffness, and swelling     If told, apply heat to the affected area as often as told by your provider. Use the heat source that your provider recommends, such as a moist heat pack or a heating pad. If you have a removable assistive device, remove it as told by your provider. Place a towel between your skin and the heat source. If your provider tells you to keep the assistive device on while you apply heat, place a towel between the assistive device and the heat source. Leave the heat on for 20-30 minutes. If told, put ice on the affected area. If you have a removable assistive device, remove it as told by your provider. Put ice in a plastic bag. Place a towel between your skin and the bag. If your provider tells you to keep the assistive device on during icing, place a towel between the assistive device and the bag. Leave the ice on for 20 minutes, 2-3 times a day. If your skin turns bright red, remove the ice or heat right away to prevent skin damage. The risk of  damage is higher if you cannot feel pain, heat, or cold. Move your fingers or toes often to reduce stiffness and swelling. Raise (elevate) the affected area above the level of your heart while you are sitting or lying down. General instructions Take over-the-counter and prescription medicines only as told by your provider. Maintain a healthy weight. Follow instructions from your provider for weight control. Do not use any products that contain nicotine or tobacco. These products include cigarettes, chewing tobacco, and vaping devices, such as e-cigarettes. If you need help quitting, ask your provider. Use assistive devices as told by your provider. Where to find more information [employer] of Arthritis and Musculoskeletal and Skin Diseases: niams.SouthExposed.es [employer] on Aging: AquariamThe[employer].co.nz American [employer] of Rheumatology: rheumatology.org Contact a health care provider if: You have redness, swelling, or a feeling of warmth in a joint that gets worse. You have a fever along with joint or muscle aches. You develop a rash. You have trouble doing your normal activities. You have pain that gets worse and is not relieved by pain medicine. This information is not intended to replace advice given to you by your health care provider. Make sure you discuss any questions you have with your health care provider. Document Revised: 03/11/2022 Document Reviewed: 03/11/2022 Elsevier Patient Education  [place].

## 2022-10-07 ENCOUNTER — Other Ambulatory Visit: Payer: Medicare Other | Admitting: Rheumatology

## 2022-11-01 ENCOUNTER — Ambulatory Visit (INDEPENDENT_AMBULATORY_CARE_PROVIDER_SITE_OTHER): Payer: Medicare Other | Admitting: Family Medicine

## 2022-11-01 ENCOUNTER — Encounter: Payer: Self-pay | Admitting: Family Medicine

## 2022-11-01 VITALS — BP 122/68 | HR 70 | Temp 98.0°F | Ht 63.0 in | Wt 177.1 lb

## 2022-11-01 DIAGNOSIS — I10 Essential (primary) hypertension: Secondary | ICD-10-CM | POA: Diagnosis not present

## 2022-11-01 DIAGNOSIS — J302 Other seasonal allergic rhinitis: Secondary | ICD-10-CM | POA: Diagnosis not present

## 2022-11-01 DIAGNOSIS — Z7689 Persons encountering health services in other specified circumstances: Secondary | ICD-10-CM

## 2022-11-01 LAB — COMPREHENSIVE METABOLIC PANEL
ALT: 18 U/L (ref 0–35)
AST: 18 U/L (ref 0–37)
Albumin: 4 g/dL (ref 3.5–5.2)
Alkaline Phosphatase: 70 U/L (ref 39–117)
BUN: 17 mg/dL (ref 6–23)
CO2: 29 mEq/L (ref 19–32)
Calcium: 9.5 mg/dL (ref 8.4–10.5)
Chloride: 103 mEq/L (ref 96–112)
Creatinine, Ser: 0.76 mg/dL (ref 0.40–1.20)
GFR: 78.51 mL/min (ref >60.00)
Glucose, Bld: 88 mg/dL (ref 70–99)
Potassium: 4 mEq/L (ref 3.5–5.1)
Sodium: 140 mEq/L (ref 135–145)
Total Bilirubin: 0.4 mg/dL (ref 0.2–1.2)
Total Protein: 7.3 g/dL (ref 6.0–8.3)

## 2022-11-01 MED ORDER — CETIRIZINE HCL 10 MG PO TABS: 10.0000 mg | ORAL_TABLET | Freq: Every day | ORAL | 1 refills | Status: DC

## 2022-11-01 NOTE — Assessment & Plan Note (Signed)
>>  ASSESSMENT AND PLAN FOR ALLERGIC [facility] WRITTEN ON 11/01/2022  1:41 PM BY Zandra Abts R, NP  Lungs are clear on PE. Dry cough may be from seasonal allergies. Prescribed Zyrtec 10mg  daily at bedtime. Also, recommend to start taking Flonase daily. If not improved, follow up.

## 2022-11-01 NOTE — Assessment & Plan Note (Signed)
Blood pressure is stable. Controlled. Continue with Lisinopril/HCTZ 10-12.5mg  daily. Ordered CMP to assess kidney and potassium level.

## 2022-11-01 NOTE — Assessment & Plan Note (Signed)
Lungs are clear on PE. Dry cough may be from seasonal allergies. Prescribed Zyrtec 10mg  daily at bedtime. Also, recommend to start taking Flonase daily. If not improved, follow up.

## 2022-11-01 NOTE — Progress Notes (Signed)
New Patient Office Visit  Subjective    Patient ID: Frances Hunter, female    DOB: 19-Aug-1950  Age: 72 y.o. MRN: [id number]  CC:  Chief Complaint  Patient presents with   Establish Care    Pt is here today to [facility]. Pt reports she has a dry cough. Pt reports for 1 week. Pt is Fasting. Pt would like a physical at next appt.    HPI Frances Hunter presents to establish care with new provider. Patients last PCP was [name], NP within same practice.   Specialist is [facility] Rheumatology with Dr. Pollyann Savoy. Last seen on 10/05/2022 with a 4 month follow up.  HTN: Chronic. Patient takes Lisinopriol/HCTZ 10-12.5mg  daily. Patient reports she monitors her blood pressure at home. About the same as in office. Denies chest pain, shortness of breath, headaches only from coughing, dizziness, lightheadedness or lower extremity edema. Scheduled for ophthalmology eye exam later this month. Change in eye sight.   Seasonal allergies: Chronic. Patient is taking Flonase 3 times a week. Dry cough, started about a week ago. Noticed it started after being outside. Today, she had a little phlegm. She reports she tried Coricidin with no relief.   Outpatient Encounter Medications as of 11/01/2022  Medication Sig   cetirizine (ZYRTEC) 10 MG tablet Take 1 tablet (10 mg total) by mouth daily.   fluticasone (FLONASE) 50 MCG/ACT nasal spray Place 2 sprays into both nostrils daily.   lisinopril-hydrochlorothiazide (ZESTORETIC) 10-12.5 MG tablet Take 1 tablet by mouth daily.   [DISCONTINUED] Diclofenac Sodium 1 % CREA Apply 4 g topically 4 (four) times daily as needed.   [DISCONTINUED] famotidine (PEPCID) 20 MG tablet Take 1 tablet (20 mg total) by mouth 2 (two) times daily as needed for heartburn or indigestion. (Patient not taking: Reported on 11/01/2022)   [DISCONTINUED] fluticasone (FLONASE) 50 MCG/ACT nasal spray Place 2 sprays into both nostrils daily. (Patient not taking: Reported on  11/01/2022)   [DISCONTINUED] lisinopril-hydrochlorothiazide (ZESTORETIC) 10-12.5 MG tablet Take 1 tablet by mouth daily. (Patient not taking: Reported on 10/05/2022)   No facility-administered encounter medications on file as of 11/01/2022.   Past Medical History:  Diagnosis Date   Arthritis    GERD (gastroesophageal reflux disease)    Hypertension    Osteopenia    low Frax score, jan 2019   Seasonal allergies     Past Surgical History:  Procedure Laterality Date   TUBAL LIGATION      Family History  Problem Relation Age of Onset   Liver disease Mother 64       cyst   Hypertension Father    Lymphoma Father 5       lymphoma   Hyperlipidemia Sister    Diabetes Brother    Hyperlipidemia Brother    Hypertension Brother    Hypertension Daughter    Diabetes Maternal Grandfather    Lymphoma Paternal Grandfather    Breast cancer Neg Hx     Social History   Socioeconomic History   Marital status: Married    Spouse name: Not on file   Number of children: 3   Years of education: Not on file   Highest education level: Bachelor's degree (e.g., BA, AB, BS)  Occupational History   Occupation: Retired  Tobacco Use   Smoking status: Never    Passive exposure: Never   Smokeless tobacco: Never  Vaping Use   Vaping Use: Never used  Substance and Sexual Activity   Alcohol use: Yes  Alcohol/week: 1.0 standard drink of alcohol    Types: 1 Glasses of wine per week    Comment: 2 x a month   Drug use: No   Sexual activity: Not Currently  Other Topics Concern   Not on file  Social History Narrative   Married. College: Yes. Exercise: Yes.   Social Determinants of Health   Financial Resource Strain: Low Risk  (06/04/2021)   Overall Financial Resource Strain (CARDIA)    Difficulty of Paying Living Expenses: Not hard at all  Food Insecurity: No Food Insecurity (06/04/2021)   Hunger Vital Sign    Worried About Running Out of Food in the Last Year: Never true    Ran Out of Food  in the Last Year: Never true  Transportation Needs: No Transportation Needs (06/04/2021)   PRAPARE - Administrator, Civil ServiceTransportation    Lack of Transportation (Medical): No    Lack of Transportation (Non-Medical): No  Physical Activity: Sufficiently Active (06/04/2021)   Exercise Vital Sign    Days of Exercise per Week: 3 days    Minutes of Exercise per Session: 60 min  Stress: No Stress Concern Present (06/04/2021)   [employer] - Occupational Stress Questionnaire    Feeling of Stress : Not at all  Social Connections: Socially Integrated (06/04/2021)   Social Connection and Isolation Panel [NHANES]    Frequency of Communication with Friends and Family: Twice a week    Frequency of Social Gatherings with Friends and Family: Twice a week    Attends Religious Services: More than 4 times per year    Active Member of [employer]ubs or Organizations: Yes    Attends Engineer, structuralClub or Organization Meetings: More than 4 times per year    Marital Status: Married  Catering managerntimate Partner Violence: Not At Risk (06/04/2021)   Humiliation, Afraid, Rape, and Kick questionnaire    Fear of Current or Ex-Partner: No    Emotionally Abused: No    Physically Abused: No    Sexually Abused: No   ROS See HPI above    Objective   BP 122/68   Pulse 70   Temp 98 F (36.7 C)   Ht 5\' 3"  (1.6 m)   Wt 177 lb 2 oz (80.3 kg)   SpO2 98%   BMI 31.38 kg/m   Physical Exam Vitals reviewed.  Constitutional:      General: She is not in acute distress.    Appearance: Normal appearance. She is not ill-appearing, toxic-appearing or diaphoretic.  Eyes:     General:        Right eye: No discharge.        Left eye: No discharge.     Conjunctiva/sclera: Conjunctivae normal.  Cardiovascular:     Rate and Rhythm: Normal rate and regular rhythm.     Heart sounds: Normal heart sounds. No murmur heard.    No friction rub. No gallop.  Pulmonary:     Effort: Pulmonary effort is normal. No respiratory distress.     Breath  sounds: Normal breath sounds.  Musculoskeletal:        General: Normal range of motion.     Right lower leg: No edema.     Left lower leg: No edema.  Skin:    General: Skin is warm and dry.  Neurological:     General: No focal deficit present.     Mental Status: She is alert and oriented to person, place, and time. Mental status is at baseline.  Psychiatric:  Mood and Affect: Mood normal.        Behavior: Behavior normal.        Thought Content: Thought content normal.        Judgment: Judgment normal.     Assessment & Plan:  Essential hypertension Assessment & Plan: Blood pressure is stable. Controlled. Continue with Lisinopril/HCTZ 10-12.5mg  daily. Ordered CMP to assess kidney and potassium level.   Orders: -     Comprehensive metabolic panel  Seasonal allergies Assessment & Plan: >>ASSESSMENT AND PLAN FOR ALLERGIC RHINITIS WRITTEN ON 11/01/2022  1:41 PM BY Alveria Apley, NP  Lungs are clear on PE. Dry cough may be from seasonal allergies. Prescribed Zyrtec 10mg  daily at bedtime. Also, recommend to start taking Flonase daily. If not improved, follow up.   Orders: -     Cetirizine HCl; Take 1 tablet (10 mg total) by mouth daily.  Dispense: 90 tablet; Refill: 1  Encounter to establish care  1.Review health maintenance:  -Recommend RSV and Covid booster at your local pharmacy.   Return in about 3 months (around 01/31/2023) for Physical, [facility] telehealth anytime .   Zandra Abts, NP

## 2022-11-01 NOTE — Patient Instructions (Addendum)
It was a pleasure to meet you and I look forward to taking care of you.  -START Zyrtec 10mg  , 1 tablet once a day at bedtime. Start taking Flonase daily.  -Ordered labs for kidney and potassium. Office will call with lab results and you will see results in MyChart. -Continue all other medications.  -Follow up in 3 months for a physical, does not have to be fasting. Schedule AWV appointment.

## 2022-11-04 ENCOUNTER — Ambulatory Visit (INDEPENDENT_AMBULATORY_CARE_PROVIDER_SITE_OTHER): Payer: Medicare Other | Admitting: *Deleted

## 2022-11-04 DIAGNOSIS — Z13820 Encounter for screening for osteoporosis: Secondary | ICD-10-CM

## 2022-11-04 DIAGNOSIS — Z780 Asymptomatic menopausal state: Secondary | ICD-10-CM

## 2022-11-04 DIAGNOSIS — Z0000 Encounter for general adult medical examination without abnormal findings: Secondary | ICD-10-CM | POA: Diagnosis not present

## 2022-11-04 NOTE — Patient Instructions (Signed)
[name] , Thank you for taking time to come for your Medicare Wellness Visit. I appreciate your ongoing commitment to your health goals. Please review the following plan we discussed and let me know if I can assist you in the future.   Screening recommendations/referrals: Colonoscopy: up to date Mammogram: Education provided Bone Density: Education provided Recommended yearly ophthalmology/optometry visit for glaucoma screening and checkup Recommended yearly dental visit for hygiene and checkup  Vaccinations: Influenza vaccine: up to date Pneumococcal vaccine: up to date Tdap vaccine: up to date Shingles vaccine: up to date    Advanced directives: Education provided     Preventive Care 65 Years and Older, Female Preventive care refers to lifestyle choices and visits with your health care provider that can promote health and wellness. What does preventive care include? A yearly physical exam. This is also called an annual well check. Dental exams once or twice a year. Routine eye exams. Ask your health care provider how often you should have your eyes checked. Personal lifestyle choices, including: Daily care of your teeth and gums. Regular physical activity. Eating a healthy diet. Avoiding tobacco and drug use. Limiting alcohol use. Practicing safe sex. Taking low-dose aspirin every day. Taking vitamin and mineral supplements as recommended by your health care provider. What happens during an annual well check? The services and screenings done by your health care provider during your annual well check will depend on your age, overall health, lifestyle risk factors, and family history of disease. Counseling  Your health care provider may ask you questions about your: Alcohol use. Tobacco use. Drug use. Emotional well-being. Home and relationship well-being. Sexual activity. Eating habits. History of falls. Memory and ability to understand (cognition). Work and work  Astronomer. Reproductive health. Screening  You may have the following tests or measurements: Height, weight, and BMI. Blood pressure. Lipid and cholesterol levels. These may be checked every 5 years, or more frequently if you are over 76 years old. Skin check. Lung cancer screening. You may have this screening every year starting at age 57 if you have a 30-pack-year history of smoking and currently smoke or have quit within the past 15 years. Fecal occult blood test (FOBT) of the stool. You may have this test every year starting at age 62. Flexible sigmoidoscopy or colonoscopy. You may have a sigmoidoscopy every 5 years or a colonoscopy every 10 years starting at age 3. Hepatitis C blood test. Hepatitis B blood test. Sexually transmitted disease (STD) testing. Diabetes screening. This is done by checking your blood sugar (glucose) after you have not eaten for a while (fasting). You may have this done every 1-3 years. Bone density scan. This is done to screen for osteoporosis. You may have this done starting at age 54. Mammogram. This may be done every 1-2 years. Talk to your health care provider about how often you should have regular mammograms. Talk with your health care provider about your test results, treatment options, and if necessary, the need for more tests. Vaccines  Your health care provider may recommend certain vaccines, such as: Influenza vaccine. This is recommended every year. Tetanus, diphtheria, and acellular pertussis (Tdap, Td) vaccine. You may need a Td booster every 10 years. Zoster vaccine. You may need this after age 20. Pneumococcal 13-valent conjugate (PCV13) vaccine. One dose is recommended after age 38. Pneumococcal polysaccharide (PPSV23) vaccine. One dose is recommended after age 39. Talk to your health care provider about which screenings and vaccines you need and how often  you need them. This information is not intended to replace advice given to you by  your health care provider. Make sure you discuss any questions you have with your health care provider. Document Released: 08/08/2015 Document Revised: 03/31/2016 Document Reviewed: 05/13/2015 Elsevier Interactive Patient Education  2017 [place] Prevention in the Home Falls can cause injuries. They can happen to people of all ages. There are many things you can do to make your home safe and to help prevent falls. What can I do on the outside of my home? Regularly fix the edges of walkways and driveways and fix any cracks. Remove anything that might make you trip as you walk through a door, such as a raised step or threshold. Trim any bushes or trees on the path to your home. Use bright outdoor lighting. Clear any walking paths of anything that might make someone trip, such as rocks or tools. Regularly check to see if handrails are loose or broken. Make sure that both sides of any steps have handrails. Any raised decks and porches should have guardrails on the edges. Have any leaves, snow, or ice cleared regularly. Use sand or salt on walking paths during winter. Clean up any spills in your garage right away. This includes oil or grease spills. What can I do in the bathroom? Use night lights. Install grab bars by the toilet and in the tub and shower. Do not use towel bars as grab bars. Use non-skid mats or decals in the tub or shower. If you need to sit down in the shower, use a plastic, non-slip stool. Keep the floor dry. Clean up any water that spills on the floor as soon as it happens. Remove soap buildup in the tub or shower regularly. Attach bath mats securely with double-sided non-slip rug tape. Do not have throw rugs and other things on the floor that can make you trip. What can I do in the bedroom? Use night lights. Make sure that you have a light by your bed that is easy to reach. Do not use any sheets or blankets that are too big for your bed. They should not hang  down onto the floor. Have a firm chair that has side arms. You can use this for support while you get dressed. Do not have throw rugs and other things on the floor that can make you trip. What can I do in the kitchen? Clean up any spills right away. Avoid walking on wet floors. Keep items that you use a lot in easy-to-reach places. If you need to reach something above you, use a strong step stool that has a grab bar. Keep electrical cords out of the way. Do not use floor polish or wax that makes floors slippery. If you must use wax, use non-skid floor wax. Do not have throw rugs and other things on the floor that can make you trip. What can I do with my stairs? Do not leave any items on the stairs. Make sure that there are handrails on both sides of the stairs and use them. Fix handrails that are broken or loose. Make sure that handrails are as long as the stairways. Check any carpeting to make sure that it is firmly attached to the stairs. Fix any carpet that is loose or worn. Avoid having throw rugs at the top or bottom of the stairs. If you do have throw rugs, attach them to the floor with carpet tape. Make sure that you have a light  switch at the top of the stairs and the bottom of the stairs. If you do not have them, ask someone to add them for you. What else can I do to help prevent falls? Wear shoes that: Do not have high heels. Have rubber bottoms. Are comfortable and fit you well. Are closed at the toe. Do not wear sandals. If you use a stepladder: Make sure that it is fully opened. Do not climb a closed stepladder. Make sure that both sides of the stepladder are locked into place. Ask someone to hold it for you, if possible. Clearly mark and make sure that you can see: Any grab bars or handrails. First and last steps. Where the edge of each step is. Use tools that help you move around (mobility aids) if they are needed. These  include: Canes. Walkers. Scooters. Crutches. Turn on the lights when you go into a dark area. Replace any light bulbs as soon as they burn out. Set up your furniture so you have a clear path. Avoid moving your furniture around. If any of your floors are uneven, fix them. If there are any pets around you, be aware of where they are. Review your medicines with your doctor. Some medicines can make you feel dizzy. This can increase your chance of falling. Ask your doctor what other things that you can do to help prevent falls. This information is not intended to replace advice given to you by your health care provider. Make sure you discuss any questions you have with your health care provider. Document Released: 05/08/2009 Document Revised: 12/18/2015 Document Reviewed: 08/16/2014 Elsevier Interactive Patient Education  2017 [employer].

## 2022-11-04 NOTE — Progress Notes (Signed)
Subjective:   Frances Hunter is a 72 y.o. female who presents for Medicare Annual (Subsequent) preventive examination.  I connected with  Frances Hunter on 11/04/22 by a telephone enabled telemedicine application and verified that I am speaking with the correct person using two identifiers.   I discussed the limitations of evaluation and management by telemedicine. The patient expressed understanding and agreed to proceed.  Patient location: home  Provider location: telephone/home   Review of Systems     Cardiac Risk Factors include: advanced age (>3555men, 50>65 women);hypertension     Objective:    There were no vitals filed for this visit. There is no height or weight on file to calculate BMI.     11/04/2022   11:35 AM 03/05/2022    8:05 AM 06/04/2021   11:25 AM 03/27/2019    8:32 AM  Advanced Directives  Does Patient Have a Medical Advance Directive? No Yes Yes No  Type of Special educational needs teacherAdvance Directive  Healthcare Power of [employer] Corporation[employer] Healthcare Power of [place];Living will   Does patient want to make changes to medical advance directive?  No - Patient declined    Copy of Healthcare Power of Attorney in Chart?  No - copy requested No - copy requested   Would patient like information on creating a medical advance directive? No - Patient declined   No - Patient declined    Current Medications (verified) Outpatient Encounter Medications as of 11/04/2022  Medication Sig   cetirizine (ZYRTEC) 10 MG tablet Take 1 tablet (10 mg total) by mouth daily.   fluticasone (FLONASE) 50 MCG/ACT nasal spray Place 2 sprays into both nostrils daily.   lisinopril-hydrochlorothiazide (ZESTORETIC) 10-12.5 MG tablet Take 1 tablet by mouth daily.   No facility-administered encounter medications on file as of 11/04/2022.    Allergies (verified) Patient has no known allergies.   History: Past Medical History:  Diagnosis Date   Arthritis    GERD (gastroesophageal reflux disease)    Hypertension     Osteopenia    low Frax score, jan 2019   Seasonal allergies    Past Surgical History:  Procedure Laterality Date   TUBAL LIGATION     Family History  Problem Relation Age of Onset   Liver disease Mother [id number]       cyst   Hypertension Father    Lymphoma Father [id number]       lymphoma   Hyperlipidemia Sister    Diabetes Brother    Hyperlipidemia Brother    Hypertension Brother    Hypertension Daughter    Diabetes Maternal Grandfather    Lymphoma Paternal Grandfather    Breast cancer Neg Hx    Social History   Socioeconomic History   Marital status: Married    Spouse name: Not on file   Number of children: 3   Years of education: Not on file   Highest education level: Bachelor's degree (e.g., BA, AB, BS)  Occupational History   Occupation: Retired  Tobacco Use   Smoking status: Never    Passive exposure: Never   Smokeless tobacco: Never  Vaping Use   Vaping Use: Never used  Substance and Sexual Activity   Alcohol use: Yes    Alcohol/week: 1.0 standard drink of alcohol    Types: 1 Glasses of wine per week    Comment: 2 x a month   Drug use: No   Sexual activity: Not Currently  Other Topics Concern   Not on file  Social History Narrative  Married. College: Yes. Exercise: Yes.   Social Determinants of Health   Financial Resource Strain: Low Risk  (11/04/2022)   Overall Financial Resource Strain (CARDIA)    Difficulty of Paying Living Expenses: Not hard at all  Food Insecurity: No Food Insecurity (11/04/2022)   Hunger Vital Sign    Worried About Running Out of Food in the Last Year: Never true    Ran Out of Food in the Last Year: Never true  Transportation Needs: No Transportation Needs (11/04/2022)   PRAPARE - Administrator, Civil Service (Medical): No    Lack of Transportation (Non-Medical): No  Physical Activity: Insufficiently Active (11/04/2022)   Exercise Vital Sign    Days of Exercise per Week: 3 days    Minutes of Exercise per Session: 30 min   Stress: No Stress Concern Present (11/04/2022)   [employer] of Occupational Health - Occupational Stress Questionnaire    Feeling of Stress : Not at all  Social Connections: Socially Integrated (11/04/2022)   Social Connection and Isolation Panel [NHANES]    Frequency of Communication with Friends and Family: Three times a week    Frequency of Social Gatherings with Friends and Family: Three times a week    Attends Religious Services: More than 4 times per year    Active Member of Clubs or Organizations: Yes    Attends Engineer, structural: More than 4 times per year    Marital Status: Married    Tobacco Counseling Counseling given: Not Answered   Clinical Intake:  Pre-visit preparation completed: Yes  Pain : No/denies pain     Diabetes: No     Diabetic?  no  Interpreter Needed?: No  Information entered by :: Remi Haggard LPN   Activities of Daily Living    11/04/2022   11:36 AM  In your present state of health, do you have any difficulty performing the following activities:  Hearing? 0  Vision? 0  Difficulty concentrating or making decisions? 0  Walking or climbing stairs? 0  Dressing or bathing? 0  Doing errands, shopping? 0  Preparing Food and eating ? N  Using the Toilet? N  In the past six months, have you accidently leaked urine? N  Do you have problems with loss of bowel control? N  Managing your Medications? N  Managing your Finances? N  Housekeeping or managing your Housekeeping? N    Patient Care Team: Alveria Apley, NP as PCP - General (Family Medicine) Pollyann Savoy, MD as Consulting Physician (Rheumatology)  Indicate any recent Medical Services you may have received from other than [facility] providers in the past year (date may be approximate).     Assessment:   This is a routine wellness examination for Frances Hunter.  Hearing/Vision screen Hearing Screening - Comments:: No trouble hearing Vision Screening - Comments::  Up to date Frances Hunter  Dietary issues and exercise activities discussed: Current Exercise Habits: Home exercise routine, Type of exercise: strength training/weights;Other - see comments;walking, Time (Minutes): 35, Frequency (Times/Week): 4, Weekly Exercise (Minutes/Week): 140, Intensity: Mild   Goals Addressed             This Visit's Progress    Weight (lb) < 200 lb (90.7 kg)         Depression Screen    11/04/2022   11:40 AM 11/01/2022    1:01 PM 06/03/2022    2:07 PM 09/24/2021   10:45 AM 06/04/2021   11:26 AM 06/04/2021   11:23 AM 03/27/2021  9:40 AM  PHQ 2/9 Scores  PHQ - 2 Score 0 0 0 0 0 0 0  PHQ- 9 Score 0 1 0 1   1    Fall Risk    11/04/2022   11:34 AM 11/01/2022    1:00 PM 11/01/2022   12:58 PM 06/03/2022    2:07 PM 09/24/2021   10:44 AM  Fall Risk   Falls in the past year? 0 0 0 0 1  Number falls in past yr: 0 0 0  0  Injury with Fall? 0 0 0  0  Risk for fall due to :  History of fall(s) No Fall Risks No Fall Risks No Fall Risks  Follow up Falls evaluation completed;Education provided;Falls prevention discussed Falls evaluation completed Falls evaluation completed Falls evaluation completed Falls evaluation completed    FALL RISK PREVENTION PERTAINING TO THE HOME:  Any stairs in or around the home? No  If so, are there any without handrails? No  Home free of loose throw rugs in walkways, pet beds, electrical cords, etc? Yes  Adequate lighting in your home to reduce risk of falls? Yes   ASSISTIVE DEVICES UTILIZED TO PREVENT FALLS:  Life alert? No  Use of a cane, walker or w/c? No  Grab bars in the bathroom? Yes  Shower chair or bench in shower? Yes  Elevated toilet seat or a handicapped toilet? Yes   TIMED UP AND GO:  Was the test performed? No .    Cognitive Function:        11/04/2022   11:37 AM 03/27/2019    8:33 AM  6CIT Screen  What Year? 0 points 0 points  What month? 0 points 0 points  What time? 0 points 0 points  Count back from 20 0 points  0 points  Months in reverse 0 points 0 points  Repeat phrase 0 points 0 points  Total Score 0 points 0 points    Immunizations Immunization History  Administered Date(s) Administered   Fluad Quad(high Dose 65+) 03/27/2019, 03/27/2020, 03/27/2021   Influenza, High Dose Seasonal PF 09/25/2018, 03/26/2020, 06/03/2022   Influenza,inj,Quad PF,6+ Mos 05/17/2014, 05/22/2015, 05/27/2016, 06/11/2017   Pneumococcal Conjugate-13 05/27/2016   Pneumococcal Polysaccharide-23 02/23/2010, 06/11/2017   Tdap 02/24/2007, 05/22/2015   Zoster Recombinat (Shingrix) 05/14/2021, 09/17/2021    TDAP status: Up to date  Flu Vaccine status: Up to date  Pneumococcal vaccine status: Up to date  Covid-19 vaccine status: Information provided on how to obtain vaccines.   Qualifies for Shingles Vaccine? No   Zostavax completed No   Shingrix Completed?: Yes  Screening Tests Health Maintenance  Topic Date Due   MAMMOGRAM  12/16/2022   INFLUENZA VACCINE  02/24/2023   Medicare Annual Wellness (AWV)  11/04/2023   Fecal DNA (Cologuard)  10/06/2024   DTaP/Tdap/Td (3 - Td or Tdap) 05/21/2025   Pneumonia Vaccine 62+ Years old  Completed   DEXA SCAN  Completed   Hepatitis C Screening  Completed   Zoster Vaccines- Shingrix  Completed   HPV VACCINES  Aged Out   COVID-19 Vaccine  Discontinued    Health Maintenance  There are no preventive care reminders to display for this patient.   Colorectal cancer screening: Type of screening: Cologuard. Completed 2023. Repeat every 3 years  Mammogram status: Ordered 2023. Pt provided with contact info and advised to call to schedule appt.   Bone Density status: Ordered 2021. Pt provided with contact info and advised to call to schedule appt.  Lung Cancer  Screening: (Low Dose CT Chest recommended if Age 42-80 years, 30 pack-year currently smoking OR have quit w/in 15years.) does not qualify.   Lung Cancer Screening Referral:   Additional Screening:  Hepatitis C  Screening: does not qualify; Completed 2017  Vision Screening: Recommended annual ophthalmology exams for early detection of glaucoma and other disorders of the eye. Is the patient up to date with their annual eye exam?  Yes  Who is the provider or what is the name of the office in which the patient attends annual eye exams? Frances Hunter If pt is not established with a provider, would they like to be referred to a provider to establish care? No .   Dental Screening: Recommended annual dental exams for proper oral hygiene  Community Resource Referral / Chronic Care Management: CRR required this visit?  No   CCM required this visit?  No      Plan:     I have personally reviewed and noted the following in the patient's chart:   Medical and social history Use of alcohol, tobacco or illicit drugs  Current medications and supplements including opioid prescriptions. Patient is not currently taking opioid prescriptions. Functional ability and status Nutritional status Physical activity Advanced directives List of other physicians Hospitalizations, surgeries, and ER visits in previous 12 months Vitals Screenings to include cognitive, depression, and falls Referrals and appointments  In addition, I have reviewed and discussed with patient certain preventive protocols, quality metrics, and best practice recommendations. A written personalized care plan for preventive services as well as general preventive health recommendations were provided to patient.     Remi Haggard, LPN   [id number]   Nurse Notes:

## 2022-12-06 DIAGNOSIS — H524 Presbyopia: Secondary | ICD-10-CM | POA: Diagnosis not present

## 2022-12-06 DIAGNOSIS — H53143 Visual discomfort, bilateral: Secondary | ICD-10-CM | POA: Diagnosis not present

## 2022-12-06 DIAGNOSIS — H25813 Combined forms of age-related cataract, bilateral: Secondary | ICD-10-CM | POA: Diagnosis not present

## 2022-12-06 DIAGNOSIS — H52223 Regular astigmatism, bilateral: Secondary | ICD-10-CM | POA: Diagnosis not present

## 2022-12-06 DIAGNOSIS — H5213 Myopia, bilateral: Secondary | ICD-10-CM | POA: Diagnosis not present

## 2023-01-05 ENCOUNTER — Encounter: Payer: Self-pay | Admitting: Registered Nurse

## 2023-01-10 ENCOUNTER — Other Ambulatory Visit: Payer: Self-pay | Admitting: Family Medicine

## 2023-01-10 DIAGNOSIS — Z1231 Encounter for screening mammogram for malignant neoplasm of breast: Secondary | ICD-10-CM

## 2023-01-11 ENCOUNTER — Ambulatory Visit
Admission: RE | Admit: 2023-01-11 | Discharge: 2023-01-11 | Disposition: A | Discharge status: Home / Self Care | Payer: Federal, State, Local not specified - PPO | Source: Ambulatory Visit | Attending: Family Medicine | Admitting: Family Medicine

## 2023-01-11 DIAGNOSIS — Z1231 Encounter for screening mammogram for malignant neoplasm of breast: Secondary | ICD-10-CM

## 2023-01-13 ENCOUNTER — Telehealth: Payer: Self-pay

## 2023-01-13 NOTE — Telephone Encounter (Signed)
Patient called the office back, said you can send a mychart message about results if nothing is urgent to speak with her about

## 2023-01-13 NOTE — Telephone Encounter (Signed)
Noted  

## 2023-01-13 NOTE — Telephone Encounter (Signed)
-----   Message from Alveria Apley, NP sent at 01/13/2023 10:20 AM EDT ----- Mammogram looks good. No suspicious findings of malignancy. Recommend to re-screen in 1 year.

## 2023-01-31 ENCOUNTER — Ambulatory Visit (INDEPENDENT_AMBULATORY_CARE_PROVIDER_SITE_OTHER): Payer: Medicare Other | Admitting: Family Medicine

## 2023-01-31 ENCOUNTER — Encounter: Payer: Self-pay | Admitting: Family Medicine

## 2023-01-31 VITALS — BP 132/70 | HR 78 | Temp 97.8°F | Ht 63.0 in | Wt 179.0 lb

## 2023-01-31 DIAGNOSIS — Z0001 Encounter for general adult medical examination with abnormal findings: Secondary | ICD-10-CM | POA: Diagnosis not present

## 2023-01-31 DIAGNOSIS — H6121 Impacted cerumen, right ear: Secondary | ICD-10-CM | POA: Diagnosis not present

## 2023-01-31 DIAGNOSIS — Z136 Encounter for screening for cardiovascular disorders: Secondary | ICD-10-CM | POA: Diagnosis not present

## 2023-01-31 DIAGNOSIS — R7303 Prediabetes: Secondary | ICD-10-CM

## 2023-01-31 DIAGNOSIS — Z0000 Encounter for general adult medical examination without abnormal findings: Secondary | ICD-10-CM

## 2023-01-31 DIAGNOSIS — Z13220 Encounter for screening for lipoid disorders: Secondary | ICD-10-CM | POA: Diagnosis not present

## 2023-01-31 LAB — LIPID PANEL
Cholesterol: 144 mg/dL (ref 0–200)
HDL: 60.2 mg/dL (ref >39.00)
LDL Cholesterol: 75 mg/dL (ref 0–99)
NonHDL: 84.22
Total CHOL/HDL Ratio: 2
Triglycerides: 46 mg/dL (ref 0.0–149.0)
VLDL: 9.2 mg/dL (ref 0.0–40.0)

## 2023-01-31 LAB — HEMOGLOBIN A1C: Hgb A1c MFr Bld: 5.5 % (ref 4.6–6.5)

## 2023-01-31 NOTE — Progress Notes (Signed)
Complete physical exam  Patient: Frances Hunter   DOB: Oct 14, 1950   72 y.o. Female  MRN: [id number]  Subjective:    Chief Complaint  Patient presents with   Annual Exam    Pt is here today for annual Physical Pt is FASTING Pt has no concerns  Pt reports she had a fall yesterday walking up, she reports she landed on her belly. She reports she had no injury.    Frances Hunter is a 72 y.o. female who presents today for a complete physical exam. She reports consuming a low sodium, low fat, and less carbohydrates diet. Home exercise routine includes walking, bike, and gardening. At least one of them every day.. She generally feels well. She reports sleeping fairly well. She does not have additional problems to discuss today.    Most recent fall risk assessment:    01/31/2023    1:01 PM  Fall Risk   Falls in the past year? 1  Number falls in past yr: 0  Injury with Fall? 0  Risk for fall due to : No Fall Risks  Follow up Falls evaluation completed     Most recent depression screenings:    01/31/2023    1:02 PM 11/04/2022   11:40 AM  PHQ 2/9 Scores  PHQ - 2 Score 0 0  PHQ- 9 Score 1 0    Vision:Not within last year  and Dental: No current dental problems and Receives regular dental care  Patient Active Problem List   Diagnosis Date Noted   Obesity (BMI 30-39.9) 06/03/2022   Osteopenia    Osteopenia of thigh 07/07/2015   Chronic venous insufficiency 07/12/2014   Essential hypertension 12/10/2012   GERD (gastroesophageal reflux disease) 12/10/2012   Seasonal allergies 12/10/2012   Varicose veins of bilateral lower extremities with pain 08/24/2008   Past Medical History:  Diagnosis Date   Arthritis    GERD (gastroesophageal reflux disease)    Hypertension    Osteopenia    low Frax score, jan 2019   Seasonal allergies    Past Surgical History:  Procedure Laterality Date   TUBAL LIGATION     Social History   Tobacco Use   Smoking status: Never    Passive  exposure: Never   Smokeless tobacco: Never  Vaping Use   Vaping Use: Never used  Substance Use Topics   Alcohol use: Yes    Alcohol/week: 1.0 standard drink of alcohol    Types: 1 Glasses of wine per week    Comment: 2 x a month   Drug use: No   Social History   Socioeconomic History   Marital status: Married    Spouse name: Not on file   Number of children: 3   Years of education: Not on file   Highest education level: Bachelor's degree (e.g., BA, AB, BS)  Occupational History   Occupation: Retired  Tobacco Use   Smoking status: Never    Passive exposure: Never   Smokeless tobacco: Never  Vaping Use   Vaping Use: Never used  Substance and Sexual Activity   Alcohol use: Yes    Alcohol/week: 1.0 standard drink of alcohol    Types: 1 Glasses of wine per week    Comment: 2 x a month   Drug use: No   Sexual activity: Not Currently  Other Topics Concern   Not on file  Social History Narrative   Married. College: Yes. Exercise: Yes.   Social Determinants of Health  Financial Resource Strain: Low Risk  (11/04/2022)   Overall Financial Resource Strain (CARDIA)    Difficulty of Paying Living Expenses: Not hard at all  Food Insecurity: No Food Insecurity (11/04/2022)   Hunger Vital Sign    Worried About Running Out of Food in the Last Year: Never true    Ran Out of Food in the Last Year: Never true  Transportation Needs: No Transportation Needs (11/04/2022)   PRAPARE - Administrator, Civil Service (Medical): No    Lack of Transportation (Non-Medical): No  Physical Activity: Insufficiently Active (11/04/2022)   Exercise Vital Sign    Days of Exercise per Week: 3 days    Minutes of Exercise per Session: 30 min  Stress: No Stress Concern Present (11/04/2022)   [employer] of Occupational Health - Occupational Stress Questionnaire    Feeling of Stress : Not at all  Social Connections: Socially Integrated (11/04/2022)   Social Connection and Isolation Panel  [NHANES]    Frequency of Communication with Friends and Family: Three times a week    Frequency of Social Gatherings with Friends and Family: Three times a week    Attends Religious Services: More than 4 times per year    Active Member of Clubs or Organizations: Yes    Attends Banker Meetings: More than 4 times per year    Marital Status: Married  Catering manager Violence: Not At Risk (11/04/2022)   Humiliation, Afraid, Rape, and Kick questionnaire    Fear of Current or Ex-Partner: No    Emotionally Abused: No    Physically Abused: No    Sexually Abused: No   Family Status  Relation Name Status   Mother  Deceased   Father  Deceased   Sister  Alive   Sister  Alive   Brother  Alive   Daughter  Alive   Daughter  Alive   Son  Alive   MGF  Deceased   PGF  Deceased   Neg Hx  (Not Specified)   Family History  Problem Relation Age of Onset   Liver disease Mother 78       cyst   Hypertension Father    Lymphoma Father 20       lymphoma   Hyperlipidemia Sister    Diabetes Brother    Hyperlipidemia Brother    Hypertension Brother    Hypertension Daughter    Diabetes Maternal Grandfather    Lymphoma Paternal Grandfather    Breast cancer Neg Hx    No Known Allergies    Patient Care Team: Alveria Apley, NP as PCP - General (Family Medicine) Pollyann Savoy, MD as Consulting Physician (Rheumatology)   Outpatient Medications Prior to Visit  Medication Sig   calcium-vitamin D (OSCAL WITH D) 500-5 MG-MCG tablet Take 1 tablet by mouth.   cetirizine (ZYRTEC) 10 MG tablet Take 1 tablet (10 mg total) by mouth daily.   fluticasone (FLONASE) 50 MCG/ACT nasal spray Place 2 sprays into both nostrils daily.   lisinopril-hydrochlorothiazide (ZESTORETIC) 10-12.5 MG tablet Take 1 tablet by mouth daily.   No facility-administered medications prior to visit.    Review of Systems  Constitutional:  Negative for chills, fever, malaise/fatigue and weight loss.   Respiratory:  Negative for cough, shortness of breath and wheezing.   Cardiovascular:  Negative for chest pain, palpitations and leg swelling.  Gastrointestinal:  Negative for abdominal pain, blood in stool, constipation, diarrhea, heartburn, melena, nausea and vomiting.  Genitourinary:  Negative for dysuria,  frequency, hematuria and urgency.  Musculoskeletal:  Negative for joint pain.   See HPI above    Objective:    BP 132/70   Pulse 78   Temp 97.8 F (36.6 C)   Ht 5\' 3"  (1.6 m)   Wt 179 lb (81.2 kg)   SpO2 98%   BMI 31.71 kg/m    Physical Exam Vitals reviewed.  Constitutional:      General: She is not in acute distress.    Appearance: Normal appearance. She is obese. She is not ill-appearing or toxic-appearing.  HENT:     Head: Normocephalic and atraumatic.     Right Ear: There is impacted cerumen.     Left Ear: Tympanic membrane, ear canal and external ear normal. There is no impacted cerumen.     Nose:     Right Sinus: No maxillary sinus tenderness or frontal sinus tenderness.     Left Sinus: No maxillary sinus tenderness or frontal sinus tenderness.     Mouth/Throat:     Mouth: Mucous membranes are moist.     Pharynx: Oropharynx is clear. No oropharyngeal exudate or posterior oropharyngeal erythema.  Eyes:     General:        Right eye: No discharge.        Left eye: No discharge.     Conjunctiva/sclera: Conjunctivae normal.     Pupils: Pupils are equal, round, and reactive to light.  Neck:     Thyroid: No thyromegaly.  Cardiovascular:     Rate and Rhythm: Normal rate and regular rhythm.     Pulses:          Dorsalis pedis pulses are 3+ on the right side and 3+ on the left side.     Heart sounds: Normal heart sounds. No murmur heard.    No friction rub. No gallop.  Pulmonary:     Effort: Pulmonary effort is normal. No respiratory distress.     Breath sounds: Normal breath sounds.  Abdominal:     General: Abdomen is flat. Bowel sounds are normal. There is  no distension.     Palpations: Abdomen is soft.     Tenderness: There is no abdominal tenderness. There is no right CVA tenderness or left CVA tenderness.  Musculoskeletal:        General: No tenderness (Spine). Normal range of motion.     Cervical back: Normal range of motion.     Right lower leg: No edema.     Left lower leg: No edema.  Lymphadenopathy:     Head:     Right side of head: No submental, submandibular or tonsillar adenopathy.     Left side of head: No submental, submandibular or tonsillar adenopathy.     Cervical: No cervical adenopathy.  Skin:    General: Skin is warm and dry.  Neurological:     General: No focal deficit present.     Mental Status: She is alert and oriented to person, place, and time. Mental status is at baseline.  Psychiatric:        Mood and Affect: Mood normal.        Behavior: Behavior normal.        Thought Content: Thought content normal.        Judgment: Judgment normal.    PRE-PROCEDURE EXAM: Right TM cannot be visualized due to total occlusion/impaction of the ear canal. PROCEDURE INDICATION: Remove wax to visualize ear drum & relieve discomfort CONSENT:  Verbal PROCEDURE NOTE: Right ear:  The CMA, Tonya Hutchins, irrigated right ear with warm water and ear drops to remove the wax. 100% of the wax was removed.  POST- PROCEDURE EXAM: TMs successfully visualized. TM with no erythema. The patient tolerated the procedure well.  Initially had some dizziness, but improved.      Assessment & Plan:    Routine Health Maintenance and Physical Exam  Immunization History  Administered Date(s) Administered   Fluad Quad(high Dose 65+) 03/27/2019, 03/27/2020, 03/27/2021   Influenza, High Dose Seasonal PF 09/25/2018, 03/26/2020, 06/03/2022   Influenza,inj,Quad PF,6+ Mos 05/17/2014, 05/22/2015, 05/27/2016, 06/11/2017   Pneumococcal Conjugate-13 05/27/2016   Pneumococcal Polysaccharide-23 02/23/2010, 06/11/2017   Tdap 02/24/2007, 05/22/2015   Zoster  Recombinant(Shingrix) 05/14/2021, 09/17/2021    Health Maintenance  Topic Date Due   INFLUENZA VACCINE  02/24/2023   Medicare Annual Wellness (AWV)  11/04/2023   MAMMOGRAM  01/11/2024   Fecal DNA (Cologuard)  10/06/2024   DTaP/Tdap/Td (3 - Td or Tdap) 05/21/2025   Pneumonia Vaccine 56+ Years old  Completed   DEXA SCAN  Completed   Hepatitis C Screening  Completed   Zoster Vaccines- Shingrix  Completed   HPV VACCINES  Aged Out   COVID-19 Vaccine  Discontinued    Discussed health benefits of physical activity, and encouraged her to engage in regular exercise appropriate for her age and condition.  Annual physical exam  Encounter for lipid screening for cardiovascular disease -     Lipid panel  Prediabetes -     Hemoglobin A1c  Impacted cerumen, right ear  -Physical exam completed today with no concerns.  -Labs ordered today. Office will call with lab results and you may see your results on MyChart. -Right ear lavage completed today. -Keep up with regular exercise and healthy diet.  -Review health maintenance: all up to date.   Return in about 9 months (around 11/01/2023) for chronic management.   Zandra Abts, NP

## 2023-01-31 NOTE — Patient Instructions (Signed)
-  Physical exam completed today with no concerns.  -Labs ordered today. Office will call with lab results and you may see your results on MyChart. -Right ear lavage completed today. -Keep up with regular exercise and healthy diet.  -Enjoy those grand babies!

## 2023-02-09 NOTE — Progress Notes (Signed)
Office Visit Note  Patient: Frances Hunter             Date of Birth: Sep 24, 1950           MRN: [id number]             PCP: Alveria Apley, NP Referring: [name], NP Visit Date: 02/23/2023 Occupation: @GUAROCC @  Subjective:  Joint stiffness  History of Present Illness: Frances Hunter is a 72 y.o. female with osteoarthritis and osteopenia.  She had right ring trigger finger injection in February 2024.  She had good response to the injection.  She had some recurrence of symptoms which are mild.  She also had right knee joint cortisone injection for osteoarthritis on October 05, 2022 with good response.  She continues to have some stiffness in her hands and her knee joints for 5 minutes in the morning.  She denies any joint pain or joint swelling currently.  Has been taking calcium and vitamin D for osteopenia.  She is walking biking and doing gardening on a regular basis for exercise.    Activities of Daily Living:  Patient reports morning stiffness for 5 minutes.   Patient Denies nocturnal pain.  Difficulty dressing/grooming: Denies Difficulty climbing stairs: Denies Difficulty getting out of chair: Denies Difficulty using hands for taps, buttons, cutlery, and/or writing: Denies  Review of Systems  Constitutional:  Negative for fatigue.  HENT:  Negative for mouth sores and mouth dryness.   Eyes:  Negative for dryness.  Respiratory:  Negative for shortness of breath.   Cardiovascular:  Negative for palpitations.  Gastrointestinal:  Negative for blood in stool, constipation and diarrhea.  Endocrine: Negative for increased urination.  Genitourinary:  Negative for difficulty urinating.  Musculoskeletal:  Positive for morning stiffness. Negative for joint pain, joint pain, joint swelling, myalgias and myalgias.  Skin:  Negative for color change, rash and sensitivity to sunlight.  Allergic/Immunologic: Negative for susceptible to infections.  Neurological:  Negative  for dizziness.  Psychiatric/Behavioral:  Negative for depressed mood and sleep disturbance. The patient is not nervous/anxious.     PMFS History:  Patient Active Problem List   Diagnosis Date Noted   Obesity (BMI 30-39.9) 06/03/2022   Osteopenia    Osteopenia of thigh 07/07/2015   Chronic venous insufficiency 07/12/2014   Essential hypertension 12/10/2012   GERD (gastroesophageal reflux disease) 12/10/2012   Seasonal allergies 12/10/2012   Varicose veins of bilateral lower extremities with pain 08/24/2008    Past Medical History:  Diagnosis Date   Arthritis    GERD (gastroesophageal reflux disease)    Hypertension    Osteopenia    low Frax score, jan 2019   Seasonal allergies     Family History  Problem Relation Age of Onset   Liver disease Mother 51       cyst   Hypertension Father    Lymphoma Father 54       lymphoma   Hyperlipidemia Sister    Diabetes Brother    Hyperlipidemia Brother    Hypertension Brother    Hypertension Daughter    Diabetes Maternal Grandfather    Lymphoma Paternal Grandfather    Breast cancer Neg Hx    Past Surgical History:  Procedure Laterality Date   TUBAL LIGATION     Social History   Social History Narrative   Married. College: Yes. Exercise: Yes.   Immunization History  Administered Date(s) Administered   Fluad Quad(high Dose 65+) 03/27/2019, 03/27/2020, 03/27/2021   Influenza, High Dose  Seasonal PF 09/25/2018, 03/26/2020, 06/03/2022   Influenza,inj,Quad PF,6+ Mos 05/17/2014, 05/22/2015, 05/27/2016, 06/11/2017   Pneumococcal Conjugate-13 05/27/2016   Pneumococcal Polysaccharide-23 02/23/2010, 06/11/2017   Tdap 02/24/2007, 05/22/2015   Zoster Recombinant(Shingrix) 05/14/2021, 09/17/2021     Objective: Vital Signs: BP 111/66 (BP Location: Left Arm, Patient Position: Sitting, Cuff Size: Normal)   Pulse 80   Resp 15   Ht 5' 3.5" (1.613 m)   Wt 178 lb (80.7 kg)   BMI 31.04 kg/m    Physical Exam Vitals and nursing note  reviewed.  Constitutional:      Appearance: She is well-developed.  HENT:     Head: Normocephalic and atraumatic.  Eyes:     Conjunctiva/sclera: Conjunctivae normal.  Cardiovascular:     Rate and Rhythm: Normal rate and regular rhythm.     Heart sounds: Normal heart sounds.  Pulmonary:     Effort: Pulmonary effort is normal.     Breath sounds: Normal breath sounds.  Abdominal:     General: Bowel sounds are normal.     Palpations: Abdomen is soft.  Musculoskeletal:     Cervical back: Normal range of motion.  Lymphadenopathy:     Cervical: No cervical adenopathy.  Skin:    General: Skin is warm and dry.     Capillary Refill: Capillary refill takes less than 2 seconds.  Neurological:     Mental Status: She is alert and oriented to person, place, and time.  Psychiatric:        Behavior: Behavior normal.      Musculoskeletal Exam: Cervical, thoracic and lumbar spine were in good range of motion.  Shoulder joints, elbow joints, wrist joints, MCPs PIPs and DIPs been good range of motion.  PIP and DIP thickening with no synovitis was noted.  Right middle and ring finger flexor tendon thickening without discomfort was noted.  No triggering of fingers was noted.  Hip joints and knee joints in good range of motion without any warmth swelling or effusion.  There was no tenderness over ankles or MTPs.  CDAI Exam: CDAI Score: -- Patient Global: --; Provider Global: -- Swollen: --; Tender: -- Joint Exam 02/23/2023   No joint exam has been documented for this visit   There is currently no information documented on the homunculus. Go to the Rheumatology activity and complete the homunculus joint exam.  Investigation: No additional findings.  Imaging: No results found.  Recent Labs: Lab Results  Component Value Date   WBC 6.9 06/03/2022   HGB 12.8 06/03/2022   PLT 268.0 06/03/2022   NA 140 11/01/2022   K 4.0 11/01/2022   CL 103 11/01/2022   CO2 29 11/01/2022   GLUCOSE 88  11/01/2022   BUN 17 11/01/2022   CREATININE 0.76 11/01/2022   BILITOT 0.4 11/01/2022   ALKPHOS 70 11/01/2022   AST 18 11/01/2022   ALT 18 11/01/2022   PROT 7.3 11/01/2022   ALBUMIN 4.0 11/01/2022   CALCIUM 9.5 11/01/2022   GFRAA 88 03/27/2020    Speciality Comments: No specialty comments available.  Procedures:  No procedures performed Allergies: Patient has no known allergies.   Assessment / Plan:     Visit Diagnoses: Primary osteoarthritis of both hands -she had bilateral PIP and DIP thickening with no synovitis.  Clinical and radiographic findings are consistent with osteoarthritis.  Joint protection muscle strengthening was discussed.  A handout on hand exercises was given.  Elevated sed rate-she had noticed neuritis on the examination.  Trigger finger, right ring finger -  trigger finger injection 09/22/22.  She had good response to trigger finger injection.  Use of topical Voltaren gel was discussed.  Primary osteoarthritis of both knees-she denies any discomfort in her knee joints today.  She had right knee joint cortisone injection on October 05, 2022.  She had good response to injection.  No warmth swelling or effusion was noted.  A handout on lower extremity exercises was given.  Osteopenia of neck of right femur - Nov 30, 2019 the BMD measured at Femur Neck Right is 0.854 g/cm2 with a T-score of -1.3.  Use of calcium and vitamin D was discussed.  Regular exercise was emphasized.  Patient has been walking, biking and also doing strengthening exercises.  Essential hypertension  Gastroesophageal reflux disease without esophagitis  Varicose veins of bilateral lower extremities with pain  Seasonal allergies  Orders: No orders of the defined types were placed in this encounter.  No orders of the defined types were placed in this encounter.   Follow-Up Instructions: Return in about 6 months (around 08/26/2023) for Osteoarthritis.   Pollyann Savoy, MD  Note - This  record has been created using Animal nutritionist.  Chart creation errors have been sought, but may not always  have been located. Such creation errors do not reflect on  the standard of medical care.

## 2023-02-23 ENCOUNTER — Ambulatory Visit: Payer: Federal, State, Local not specified - PPO | Attending: Rheumatology | Admitting: Rheumatology

## 2023-02-23 ENCOUNTER — Encounter: Payer: Self-pay | Admitting: Rheumatology

## 2023-02-23 VITALS — BP 111/66 | HR 80 | Resp 15 | Ht 63.5 in | Wt 178.0 lb

## 2023-02-23 DIAGNOSIS — M170 Bilateral primary osteoarthritis of knee: Secondary | ICD-10-CM | POA: Diagnosis not present

## 2023-02-23 DIAGNOSIS — M85851 Other specified disorders of bone density and structure, right thigh: Secondary | ICD-10-CM | POA: Diagnosis not present

## 2023-02-23 DIAGNOSIS — I10 Essential (primary) hypertension: Secondary | ICD-10-CM | POA: Diagnosis not present

## 2023-02-23 DIAGNOSIS — M19041 Primary osteoarthritis, right hand: Secondary | ICD-10-CM

## 2023-02-23 DIAGNOSIS — M19042 Primary osteoarthritis, left hand: Secondary | ICD-10-CM | POA: Diagnosis not present

## 2023-02-23 DIAGNOSIS — K219 Gastro-esophageal reflux disease without esophagitis: Secondary | ICD-10-CM | POA: Diagnosis not present

## 2023-02-23 DIAGNOSIS — J302 Other seasonal allergic rhinitis: Secondary | ICD-10-CM | POA: Diagnosis not present

## 2023-02-23 DIAGNOSIS — I83813 Varicose veins of bilateral lower extremities with pain: Secondary | ICD-10-CM | POA: Diagnosis not present

## 2023-02-23 DIAGNOSIS — R700 Elevated erythrocyte sedimentation rate: Secondary | ICD-10-CM | POA: Diagnosis not present

## 2023-02-23 DIAGNOSIS — M65341 Trigger finger, right ring finger: Secondary | ICD-10-CM | POA: Diagnosis not present

## 2023-02-23 NOTE — Patient Instructions (Signed)
Hand Exercises Hand exercises can be helpful for almost anyone. They can strengthen your hands and improve flexibility and movement. The exercises can also increase blood flow to the hands. These results can make your work and daily tasks easier for you. Hand exercises can be especially helpful for people who have joint pain from arthritis or nerve damage from using their hands over and over. These exercises can also help people who injure a hand. Exercises Most of these hand exercises are gentle stretching and motion exercises. It is usually safe to do them often throughout the day. Warming up your hands before exercise may help reduce stiffness. You can do this with gentle massage or by placing your hands in warm water for 10-15 minutes. It is normal to feel some stretching, pulling, tightness, or mild discomfort when you begin new exercises. In time, this will improve. Remember to always be careful and stop right away if you feel sudden, very bad pain or your pain gets worse. You want to get better and be safe. Ask your health care provider which exercises are safe for you. Do exercises exactly as told by your provider and adjust them as told. Do not begin these exercises until told by your provider. Knuckle bend or "claw" fist  Stand or sit with your arm, hand, and all five fingers pointed straight up. Make sure to keep your wrist straight. Gently bend your fingers down toward your palm until the tips of your fingers are touching your palm. Keep your big knuckle straight and only bend the small knuckles in your fingers. Hold this position for 10 seconds. Straighten your fingers back to your starting position. Repeat this exercise 5-10 times with each hand. Full finger fist  Stand or sit with your arm, hand, and all five fingers pointed straight up. Make sure to keep your wrist straight. Gently bend your fingers into your palm until the tips of your fingers are touching the middle of your  palm. Hold this position for 10 seconds. Extend your fingers back to your starting position, stretching every joint fully. Repeat this exercise 5-10 times with each hand. Straight fist  Stand or sit with your arm, hand, and all five fingers pointed straight up. Make sure to keep your wrist straight. Gently bend your fingers at the big knuckle, where your fingers meet your hand, and at the middle knuckle. Keep the knuckle at the tips of your fingers straight and try to touch the bottom of your palm. Hold this position for 10 seconds. Extend your fingers back to your starting position, stretching every joint fully. Repeat this exercise 5-10 times with each hand. Tabletop  Stand or sit with your arm, hand, and all five fingers pointed straight up. Make sure to keep your wrist straight. Gently bend your fingers at the big knuckle, where your fingers meet your hand, as far down as you can. Keep the small knuckles in your fingers straight. Think of forming a tabletop with your fingers. Hold this position for 10 seconds. Extend your fingers back to your starting position, stretching every joint fully. Repeat this exercise 5-10 times with each hand. Finger spread  Place your hand flat on a table with your palm facing down. Make sure your wrist stays straight. Spread your fingers and thumb apart from each other as far as you can until you feel a gentle stretch. Hold this position for 10 seconds. Bring your fingers and thumb tight together again. Hold this position for 10 seconds. Repeat   this exercise 5-10 times with each hand. Making circles  Stand or sit with your arm, hand, and all five fingers pointed straight up. Make sure to keep your wrist straight. Make a circle by touching the tip of your thumb to the tip of your index finger. Hold for 10 seconds. Then open your hand wide. Repeat this motion with your thumb and each of your fingers. Repeat this exercise 5-10 times with each hand. Thumb  motion  Sit with your forearm resting on a table and your wrist straight. Your thumb should be facing up toward the ceiling. Keep your fingers relaxed as you move your thumb. Lift your thumb up as high as you can toward the ceiling. Hold for 10 seconds. Bend your thumb across your palm as far as you can, reaching the tip of your thumb for the small finger (pinkie) side of your palm. Hold for 10 seconds. Repeat this exercise 5-10 times with each hand. Grip strengthening  Hold a stress ball or other soft ball in the middle of your hand. Slowly increase the pressure, squeezing the ball as much as you can without causing pain. Think of bringing the tips of your fingers into the middle of your palm. All of your finger joints should bend when doing this exercise. Hold your squeeze for 10 seconds, then relax. Repeat this exercise 5-10 times with each hand. Contact a health care provider if: Your hand pain or discomfort gets much worse when you do an exercise. Your hand pain or discomfort does not improve within 2 hours after you exercise. If you have either of these problems, stop doing these exercises right away. Do not do them again unless your provider says that you can. Get help right away if: You develop sudden, severe hand pain or swelling. If this happens, stop doing these exercises right away. Do not do them again unless your provider says that you can. This information is not intended to replace advice given to you by your health care provider. Make sure you discuss any questions you have with your health care provider. Document Revised: 07/27/2022 Document Reviewed: 07/27/2022 Elsevier Patient Education  2024 [employer] Exercises for Chronic Knee Pain Chronic knee pain is pain that lasts longer than 3 months. For most people with chronic knee pain, exercise and weight loss is an important part of treatment. Your health care provider may want you to focus on: Making the muscles that  support your knee stronger. This can take pressure off your knee and reduce pain. Preventing knee stiffness. How far you can move your knee, keeping it there or making it farther. Losing weight (if this applies) to take pressure off your knee, lower your risk for injury, and make it easier for you to exercise. Your provider will help you make an exercise program that fits your needs and physical abilities. Below are simple, low-impact exercises you can do at home. Ask your provider or physical therapist how often you should do your exercise program and how many times to repeat each exercise. General safety tips  Get your provider's approval before doing any exercises. Start slowly and stop any time you feel pain. Do not exercise if your knee pain is flaring up. Warm up first. Stretching a cold muscle can cause an injury. Do 5-10 minutes of easy movement or light stretching before beginning your exercises. Do 5-10 minutes of low-impact activity (like walking or cycling) before starting strengthening exercises. Contact your provider any time you have pain during   or after exercising. Exercise can cause discomfort but should not be painful. It is normal to be a little stiff or sore after exercising. Stretching and range-of-motion exercises Front thigh stretch  Stand up straight and support your body by holding on to a chair or resting one hand on a wall. With your legs straight and close together, bend one knee to lift your heel up toward your butt. Using one hand for support, grab your ankle with your free hand. Pull your foot up closer toward your butt to feel the stretch in front of your thigh. Hold the stretch for 30 seconds. Repeat __________ times. Complete this exercise __________ times a day. Back thigh stretch  Sit on the floor with your back straight and your legs out straight in front of you. Place the palms of your hands on the floor and slide them toward your feet as you bend at the  hip. Try to touch your nose to your knees and feel the stretch in the back of your thighs. Hold for 30 seconds. Repeat __________ times. Complete this exercise __________ times a day. Calf stretch  Stand facing a wall. Place the palms of your hands flat against the wall, arms extended, and lean slightly against the wall. Get into a lunge position with one leg bent at the knee and the other leg stretched out straight behind you. Keep both feet facing the wall and increase the bend in your knee while keeping the heel of the other leg flat on the ground. You should feel the stretch in your calf. Hold for 30 seconds. Repeat __________ times. Complete this exercise __________ times a day. Strengthening exercises Straight leg lift  Lie on your back with one knee bent and the other leg out straight. Slowly lift the straight leg without bending the knee. Lift until your foot is about 12 inches (30 cm) off the floor. Hold for 3-5 seconds and slowly lower your leg. Repeat __________ times. Complete this exercise __________ times a day. Single leg dip  Stand between two chairs and put both hands on the backs of the chairs for support. Extend one leg out straight with your body weight resting on the heel of the standing leg. Slowly bend your standing knee to dip your body to the level that is comfortable for you. Hold for 3-5 seconds. Repeat __________ times. Complete this exercise __________ times a day. Hamstring curls  Stand straight, knees close together, facing the back of a chair. Hold on to the back of a chair with both hands. Keep one leg straight. Bend the other knee while bringing the heel up toward the butt until the knee is bent at a 90-degree angle (right angle). Hold for 3-5 seconds. Repeat __________ times. Complete this exercise __________ times a day. Wall squat  Stand straight with your back, hips, and head against a wall. Step forward one foot at a time with your back  still against the wall. Your feet should be 2 feet (61 cm) from the wall at shoulder width. Keeping your back, hips, and head against the wall, slide down the wall to as close to a sitting position as you can get. Hold for 5-10 seconds, then slowly slide back up. Repeat __________ times. Complete this exercise __________ times a day. Step-ups  Stand in front of a sturdy platform or stool that is about 6 inches (15 cm) high. Slowly step up with your left / right foot, keeping your knee in line with your hip   and foot. Do not let your knee bend so far that you cannot see your toes. Hold on to a chair for balance, but do not use it for support. Slowly unlock your knee and lower yourself to the starting position. Repeat __________ times. Complete this exercise __________ times a day. Contact a health care provider if: Your exercises cause pain. Your pain is worse after you exercise. Your pain prevents you from doing your exercises. This information is not intended to replace advice given to you by your health care provider. Make sure you discuss any questions you have with your health care provider. Document Revised: 07/27/2022 Document Reviewed: 07/27/2022 Elsevier Patient Education  2024 [employer]  

## 2023-03-04 DIAGNOSIS — H25043 Posterior subcapsular polar age-related cataract, bilateral: Secondary | ICD-10-CM | POA: Diagnosis not present

## 2023-03-04 DIAGNOSIS — H18413 Arcus senilis, bilateral: Secondary | ICD-10-CM | POA: Diagnosis not present

## 2023-03-04 DIAGNOSIS — H2511 Age-related nuclear cataract, right eye: Secondary | ICD-10-CM | POA: Diagnosis not present

## 2023-03-04 DIAGNOSIS — H2513 Age-related nuclear cataract, bilateral: Secondary | ICD-10-CM | POA: Diagnosis not present

## 2023-03-04 DIAGNOSIS — H25013 Cortical age-related cataract, bilateral: Secondary | ICD-10-CM | POA: Diagnosis not present

## 2023-03-23 ENCOUNTER — Other Ambulatory Visit: Payer: Self-pay | Admitting: Family Medicine

## 2023-03-23 DIAGNOSIS — J301 Allergic rhinitis due to pollen: Secondary | ICD-10-CM

## 2023-04-26 HISTORY — PX: CATARACT EXTRACTION, BILATERAL: SHX1313

## 2023-05-09 DIAGNOSIS — Z23 Encounter for immunization: Secondary | ICD-10-CM | POA: Diagnosis not present

## 2023-05-16 DIAGNOSIS — H2512 Age-related nuclear cataract, left eye: Secondary | ICD-10-CM | POA: Diagnosis not present

## 2023-05-16 DIAGNOSIS — H52209 Unspecified astigmatism, unspecified eye: Secondary | ICD-10-CM | POA: Diagnosis not present

## 2023-05-17 DIAGNOSIS — H25011 Cortical age-related cataract, right eye: Secondary | ICD-10-CM | POA: Diagnosis not present

## 2023-05-17 DIAGNOSIS — H2511 Age-related nuclear cataract, right eye: Secondary | ICD-10-CM | POA: Diagnosis not present

## 2023-05-17 DIAGNOSIS — H25041 Posterior subcapsular polar age-related cataract, right eye: Secondary | ICD-10-CM | POA: Diagnosis not present

## 2023-05-30 DIAGNOSIS — H52209 Unspecified astigmatism, unspecified eye: Secondary | ICD-10-CM | POA: Diagnosis not present

## 2023-05-30 DIAGNOSIS — H2511 Age-related nuclear cataract, right eye: Secondary | ICD-10-CM | POA: Diagnosis not present

## 2023-06-03 ENCOUNTER — Other Ambulatory Visit: Payer: Self-pay | Admitting: Family Medicine

## 2023-06-03 DIAGNOSIS — I10 Essential (primary) hypertension: Secondary | ICD-10-CM

## 2023-06-08 ENCOUNTER — Telehealth: Payer: Self-pay | Admitting: Family Medicine

## 2023-06-08 NOTE — Telephone Encounter (Signed)
error 

## 2023-06-09 ENCOUNTER — Other Ambulatory Visit: Payer: Self-pay | Admitting: Family Medicine

## 2023-06-09 DIAGNOSIS — I10 Essential (primary) hypertension: Secondary | ICD-10-CM

## 2023-06-10 ENCOUNTER — Telehealth: Payer: Self-pay | Admitting: Family Medicine

## 2023-06-10 ENCOUNTER — Ambulatory Visit
Admission: RE | Admit: 2023-06-10 | Discharge: 2023-06-10 | Disposition: A | Discharge status: Home / Self Care | Payer: Federal, State, Local not specified - PPO | Source: Ambulatory Visit | Attending: Family Medicine

## 2023-06-10 ENCOUNTER — Other Ambulatory Visit: Payer: Self-pay | Admitting: Family

## 2023-06-10 DIAGNOSIS — I10 Essential (primary) hypertension: Secondary | ICD-10-CM

## 2023-06-10 MED ORDER — LISINOPRIL-HYDROCHLOROTHIAZIDE 10-12.5 MG PO TABS: 1.0000 | ORAL_TABLET | Freq: Every day | ORAL | 1 refills | Status: DC

## 2023-06-10 NOTE — Telephone Encounter (Signed)
Prescription Request  06/10/2023  LOV: 01/31/2023  What is the name of the medication or equipment? lisinopril-hydrochlorothiazide (ZESTORETIC) 10-12.5 MG table   Have you contacted your pharmacy to request a refill? No   Which pharmacy would you like this sent to?  [id number] [place], [place] - [address] [place] [address] Phone: [phone] Fax: [phone]    Patient notified that their request is being sent to the clinical staff for review and that they should receive a response within 2 business days.   Please advise at Mobile [phone] (mobile)

## 2023-08-12 NOTE — Progress Notes (Signed)
Office Visit Note  Patient: Frances Hunter             Date of Birth: [birth year]           MRN: [id number]             PCP: Alveria Apley, NP Referring: Alveria Apley, NP Visit Date: 08/26/2023 Occupation: @GUAROCC @  Subjective:  Joint stiffness  History of Present Illness: Frances Hunter is a 73 y.o. female with osteoarthritis and osteopenia.  She states her right knee joint did well after the last cortisone injection in March 2024.  Now the pain in the right knee joint has come back.  She also feels intermittent swelling in her right knee joint.  She has been having stiffness in her hands.  She states the right ring trigger finger resolved after the cortisone injection with no recurrence.  She has not noticed joint swelling any other joints.  She states she has not been very active this winter.  She continues to take calcium and vitamin D for osteopenia.    Activities of Daily Living:  Patient reports morning stiffness for 30 minutes.   Patient Reports nocturnal pain.  Difficulty dressing/grooming: Denies Difficulty climbing stairs: Denies Difficulty getting out of chair: Denies Difficulty using hands for taps, buttons, cutlery, and/or writing: Denies  Review of Systems  Constitutional:  Negative for fatigue.  HENT:  Positive for nose dryness. Negative for mouth sores and mouth dryness.   Eyes:  Positive for dryness. Negative for pain.  Respiratory:  Negative for shortness of breath and difficulty breathing.   Cardiovascular:  Negative for chest pain and palpitations.  Gastrointestinal:  Negative for blood in stool, constipation and diarrhea.  Endocrine: Negative for increased urination.  Genitourinary:  Negative for painful urination and involuntary urination.  Musculoskeletal:  Positive for joint pain, joint pain, joint swelling and morning stiffness. Negative for gait problem, myalgias, muscle weakness, muscle tenderness and myalgias.  Skin:  Negative  for color change, rash, hair loss and sensitivity to sunlight.  Allergic/Immunologic: Negative for susceptible to infections.  Neurological:  Negative for dizziness and headaches.  Hematological:  Negative for swollen glands.  Psychiatric/Behavioral:  Negative for depressed mood and sleep disturbance. The patient is not nervous/anxious.     PMFS History:  Patient Active Problem List   Diagnosis Date Noted   Obesity (BMI 30-39.9) 06/03/2022   Osteopenia    Osteopenia of thigh 07/07/2015   Chronic venous insufficiency 07/12/2014   Essential hypertension 12/10/2012   GERD (gastroesophageal reflux disease) 12/10/2012   Seasonal allergies 12/10/2012   Varicose veins of bilateral lower extremities with pain 08/24/2008    Past Medical History:  Diagnosis Date   Arthritis    GERD (gastroesophageal reflux disease)    Hypertension    Osteopenia    low Frax score, jan 2019   Seasonal allergies     Family History  Problem Relation Age of Onset   Liver disease Mother 67       cyst   Hypertension Father    Lymphoma Father 22       lymphoma   Hyperlipidemia Sister    Diabetes Brother    Hyperlipidemia Brother    Hypertension Brother    Diabetes Maternal Grandfather    Lymphoma Paternal Grandfather    Hypertension Daughter    Breast cancer Neg Hx    Past Surgical History:  Procedure Laterality Date   CATARACT EXTRACTION, BILATERAL  04/2023   TUBAL LIGATION  Social History   Social History Narrative   Married. College: Yes. Exercise: Yes.   Immunization History  Administered Date(s) Administered   Fluad Quad(high Dose 65+) 03/27/2019, 03/27/2020, 03/27/2021   Fluad Trivalent(High Dose 65+) 05/09/2023   Influenza, High Dose Seasonal PF 09/25/2018, 03/26/2020, 06/03/2022   Influenza,inj,Quad PF,6+ Mos 05/17/2014, 05/22/2015, 05/27/2016, 06/11/2017   Moderna Covid-19 Fall Seasonal Vaccine 69yrs & older 05/09/2023   Pneumococcal Conjugate-13 05/27/2016   Pneumococcal  Polysaccharide-23 02/23/2010, 06/11/2017   Tdap 02/24/2007, 05/22/2015   Zoster Recombinant(Shingrix) 05/14/2021, 09/17/2021     Objective: Vital Signs: BP (!) 144/75 (BP Location: Left Arm, Patient Position: Sitting, Cuff Size: Normal)   Pulse 82   Resp 14   Ht 5' 3.5" (1.613 m)   Wt 179 lb 6.4 oz (81.4 kg)   BMI 31.28 kg/m    Physical Exam Vitals and nursing note reviewed.  Constitutional:      Appearance: She is well-developed.  HENT:     Head: Normocephalic and atraumatic.  Eyes:     Conjunctiva/sclera: Conjunctivae normal.  Cardiovascular:     Rate and Rhythm: Normal rate and regular rhythm.     Heart sounds: Normal heart sounds.  Pulmonary:     Effort: Pulmonary effort is normal.     Breath sounds: Normal breath sounds.  Abdominal:     General: Bowel sounds are normal.     Palpations: Abdomen is soft.  Musculoskeletal:     Cervical back: Normal range of motion.  Lymphadenopathy:     Cervical: No cervical adenopathy.  Skin:    General: Skin is warm and dry.     Capillary Refill: Capillary refill takes less than 2 seconds.  Neurological:     Mental Status: She is alert and oriented to person, place, and time.  Psychiatric:        Behavior: Behavior normal.      Musculoskeletal Exam: Cervical spine was in good range of motion.  She had no tenderness over thoracic or lumbar spine.  Shoulders, elbows, wrist joints, MCPs PIPs and DIPs Juengel range of motion with no synovitis.  Bilateral PIP and DIP thickening was noted.  No flexor tendon thickening was noted.  Hip joints and knee joints in good range of motion.  She had some warmth on palpation of her right knee joint.  There was no tenderness over ankles or MTPs.  Overcrowding of toes was noted.  CDAI Exam: CDAI Score: -- Patient Global: --; Provider Global: -- Swollen: --; Tender: -- Joint Exam 08/26/2023   No joint exam has been documented for this visit   There is currently no information documented on the  homunculus. Go to the Rheumatology activity and complete the homunculus joint exam.  Investigation: No additional findings.  Imaging: No results found.  Recent Labs: Lab Results  Component Value Date   WBC 6.9 06/03/2022   HGB 12.8 06/03/2022   PLT 268.0 06/03/2022   NA 140 11/01/2022   K 4.0 11/01/2022   CL 103 11/01/2022   CO2 29 11/01/2022   GLUCOSE 88 11/01/2022   BUN 17 11/01/2022   CREATININE 0.76 11/01/2022   BILITOT 0.4 11/01/2022   ALKPHOS 70 11/01/2022   AST 18 11/01/2022   ALT 18 11/01/2022   PROT 7.3 11/01/2022   ALBUMIN 4.0 11/01/2022   CALCIUM 9.5 11/01/2022   GFRAA 88 03/27/2020    Speciality Comments: No specialty comments available.  Procedures:  Large Joint Inj: R knee on 08/26/2023 10:21 AM Indications: pain Details: 27 G  1.5 in needle, medial approach  Arthrogram: No  Medications: 40 mg triamcinolone acetonide 40 MG/ML; 1.5 mL lidocaine 1 % Aspirate: 0 mL Outcome: tolerated well, no immediate complications Procedure, treatment alternatives, risks and benefits explained, specific risks discussed. Consent was given by the patient. Immediately prior to procedure a time out was called to verify the correct patient, procedure, equipment, support staff and site/side marked as required. Patient was prepped and draped in the usual sterile fashion.     Allergies: Patient has no known allergies.   Assessment / Plan:     Visit Diagnoses: Primary osteoarthritis of both hands-patient complains of pain and stiffness in her bilateral hands.  No synovitis was noted.  Bilateral PIP and DIP thickening was noted.  Joint protection muscle strengthening was discussed.  Use of topical Voltaren gel was discussed.  A handout on hand exercises was given.  Trigger finger, right ring finger -resolved.  Trigger finger injection September 22, 2022.  Primary osteoarthritis of both knees -she has been having increased pain and discomfort in her right knee joint.  She had  right knee injection October 05, 2022 which relieved discomfort for many months.  Now the pain has recurred.  She had some warmth on palpation of her right knee joint.  Previous x-rays showed moderate to severe osteoarthritis and chondromalacia patella.  Per patient's request right knee joint was prepped in sterile fashion and injected with lidocaine and Kenalog as described above.  She tolerated the procedure well.  Postprocedure instructions were given.  Lower extremity muscle strength exercises were discussed.  Weight loss diet and exercise was also emphasized.  Osteopenia of neck of right femur -June 10, 2023 DEXA scan: The BMD measured at Femur Neck Right is 0.807 g/cm2 with a T-score of -1.7.  There is no significant change when compared to the DEXA scan of 2021.  Patient has been taking calcium and vitamin D.  Elevated sed rate-no synovitis was noted.  Essential hypertension-blood pressure was elevated at 144 over 75.  Patient was advised to monitor blood pressure closely and follow-up with the PCP.  She is on lisinopril HCTZ.  Varicose veins of bilateral lower extremities with pain-use of compression socks was advised.  Gastroesophageal reflux disease without esophagitis  Seasonal allergies-she used Zyrtec as needed.  Orders: Orders Placed This Encounter  Procedures   Large Joint Inj   No orders of the defined types were placed in this encounter.   Follow-Up Instructions: Return in about 6 months (around 02/23/2024) for Osteoarthritis.   Pollyann Savoy, MD  Note - This record has been created using Animal nutritionist.  Chart creation errors have been sought, but may not always  have been located. Such creation errors do not reflect on  the standard of medical care.

## 2023-08-26 ENCOUNTER — Ambulatory Visit: Payer: Federal, State, Local not specified - PPO | Attending: Rheumatology | Admitting: Rheumatology

## 2023-08-26 ENCOUNTER — Encounter: Payer: Self-pay | Admitting: Rheumatology

## 2023-08-26 VITALS — BP 144/75 | HR 82 | Resp 14 | Ht 63.5 in | Wt 179.4 lb

## 2023-08-26 DIAGNOSIS — M19041 Primary osteoarthritis, right hand: Secondary | ICD-10-CM

## 2023-08-26 DIAGNOSIS — M170 Bilateral primary osteoarthritis of knee: Secondary | ICD-10-CM | POA: Diagnosis not present

## 2023-08-26 DIAGNOSIS — M19042 Primary osteoarthritis, left hand: Secondary | ICD-10-CM

## 2023-08-26 DIAGNOSIS — M85851 Other specified disorders of bone density and structure, right thigh: Secondary | ICD-10-CM

## 2023-08-26 DIAGNOSIS — K219 Gastro-esophageal reflux disease without esophagitis: Secondary | ICD-10-CM | POA: Diagnosis not present

## 2023-08-26 DIAGNOSIS — M1711 Unilateral primary osteoarthritis, right knee: Secondary | ICD-10-CM

## 2023-08-26 DIAGNOSIS — I10 Essential (primary) hypertension: Secondary | ICD-10-CM | POA: Diagnosis not present

## 2023-08-26 DIAGNOSIS — J302 Other seasonal allergic rhinitis: Secondary | ICD-10-CM

## 2023-08-26 DIAGNOSIS — M65341 Trigger finger, right ring finger: Secondary | ICD-10-CM | POA: Diagnosis not present

## 2023-08-26 DIAGNOSIS — R700 Elevated erythrocyte sedimentation rate: Secondary | ICD-10-CM | POA: Diagnosis not present

## 2023-08-26 DIAGNOSIS — I83813 Varicose veins of bilateral lower extremities with pain: Secondary | ICD-10-CM

## 2023-08-26 MED ORDER — LIDOCAINE HCL 1 % IJ SOLN
1.5000 mL | INTRAMUSCULAR | Status: AC | PRN
Start: 2023-08-26 — End: 2023-08-26
  Administered 2023-08-26: 1.5 mL

## 2023-08-26 MED ORDER — TRIAMCINOLONE ACETONIDE 40 MG/ML IJ SUSP
40.0000 mg | INTRAMUSCULAR | Status: AC | PRN
  Administered 2023-08-26: 40 mg via INTRA_ARTICULAR

## 2023-08-26 NOTE — Patient Instructions (Addendum)
 Hand Exercises Hand exercises can be helpful for almost anyone. They can strengthen your hands and improve flexibility and movement. The exercises can also increase blood flow to the hands. These results can make your work and daily tasks easier for you. Hand exercises can be especially helpful for people who have joint pain from arthritis or nerve damage from using their hands over and over. These exercises can also help people who injure a hand. Exercises Most of these hand exercises are gentle stretching and motion exercises. It is usually safe to do them often throughout the day. Warming up your hands before exercise may help reduce stiffness. You can do this with gentle massage or by placing your hands in warm water for 10-15 minutes. It is normal to feel some stretching, pulling, tightness, or mild discomfort when you begin new exercises. In time, this will improve. Remember to always be careful and stop right away if you feel sudden, very bad pain or your pain gets worse. You want to get better and be safe. Ask your health care provider which exercises are safe for you. Do exercises exactly as told by your provider and adjust them as told. Do not begin these exercises until told by your provider. Knuckle bend or "claw" fist  Stand or sit with your arm, hand, and all five fingers pointed straight up. Make sure to keep your wrist straight. Gently bend your fingers down toward your palm until the tips of your fingers are touching your palm. Keep your big knuckle straight and only bend the small knuckles in your fingers. Hold this position for 10 seconds. Straighten your fingers back to your starting position. Repeat this exercise 5-10 times with each hand. Full finger fist  Stand or sit with your arm, hand, and all five fingers pointed straight up. Make sure to keep your wrist straight. Gently bend your fingers into your palm until the tips of your fingers are touching the middle of your  palm. Hold this position for 10 seconds. Extend your fingers back to your starting position, stretching every joint fully. Repeat this exercise 5-10 times with each hand. Straight fist  Stand or sit with your arm, hand, and all five fingers pointed straight up. Make sure to keep your wrist straight. Gently bend your fingers at the big knuckle, where your fingers meet your hand, and at the middle knuckle. Keep the knuckle at the tips of your fingers straight and try to touch the bottom of your palm. Hold this position for 10 seconds. Extend your fingers back to your starting position, stretching every joint fully. Repeat this exercise 5-10 times with each hand. Tabletop  Stand or sit with your arm, hand, and all five fingers pointed straight up. Make sure to keep your wrist straight. Gently bend your fingers at the big knuckle, where your fingers meet your hand, as far down as you can. Keep the small knuckles in your fingers straight. Think of forming a tabletop with your fingers. Hold this position for 10 seconds. Extend your fingers back to your starting position, stretching every joint fully. Repeat this exercise 5-10 times with each hand. Finger spread  Place your hand flat on a table with your palm facing down. Make sure your wrist stays straight. Spread your fingers and thumb apart from each other as far as you can until you feel a gentle stretch. Hold this position for 10 seconds. Bring your fingers and thumb tight together again. Hold this position for 10 seconds. Repeat  this exercise 5-10 times with each hand. Making circles  Stand or sit with your arm, hand, and all five fingers pointed straight up. Make sure to keep your wrist straight. Make a circle by touching the tip of your thumb to the tip of your index finger. Hold for 10 seconds. Then open your hand wide. Repeat this motion with your thumb and each of your fingers. Repeat this exercise 5-10 times with each hand. Thumb  motion  Sit with your forearm resting on a table and your wrist straight. Your thumb should be facing up toward the ceiling. Keep your fingers relaxed as you move your thumb. Lift your thumb up as high as you can toward the ceiling. Hold for 10 seconds. Bend your thumb across your palm as far as you can, reaching the tip of your thumb for the small finger (pinkie) side of your palm. Hold for 10 seconds. Repeat this exercise 5-10 times with each hand. Grip strengthening  Hold a stress ball or other soft ball in the middle of your hand. Slowly increase the pressure, squeezing the ball as much as you can without causing pain. Think of bringing the tips of your fingers into the middle of your palm. All of your finger joints should bend when doing this exercise. Hold your squeeze for 10 seconds, then relax. Repeat this exercise 5-10 times with each hand. Contact a health care provider if: Your hand pain or discomfort gets much worse when you do an exercise. Your hand pain or discomfort does not improve within 2 hours after you exercise. If you have either of these problems, stop doing these exercises right away. Do not do them again unless your provider says that you can. Get help right away if: You develop sudden, severe hand pain or swelling. If this happens, stop doing these exercises right away. Do not do them again unless your provider says that you can. This information is not intended to replace advice given to you by your health care provider. Make sure you discuss any questions you have with your health care provider. Document Revised: 07/27/2022 Document Reviewed: 07/27/2022 Elsevier Patient Education  2024 [employer]  Exercises for Chronic Knee Pain Chronic knee pain is pain that lasts longer than 3 months. For most people with chronic knee pain, exercise and weight loss is an important part of treatment. Your health care provider may want you to focus on: Making the muscles that  support your knee stronger. This can take pressure off your knee and reduce pain. Preventing knee stiffness. How far you can move your knee, keeping it there or making it farther. Losing weight (if this applies) to take pressure off your knee, lower your risk for injury, and make it easier for you to exercise. Your provider will help you make an exercise program that fits your needs and physical abilities. Below are simple, low-impact exercises you can do at home. Ask your provider or physical therapist how often you should do your exercise program and how many times to repeat each exercise. General safety tips  Get your provider's approval before doing any exercises. Start slowly and stop any time you feel pain. Do not exercise if your knee pain is flaring up. Warm up first. Stretching a cold muscle can cause an injury. Do 5-10 minutes of easy movement or light stretching before beginning your exercises. Do 5-10 minutes of low-impact activity (like walking or cycling) before starting strengthening exercises. Contact your provider any time you have pain  during or after exercising. Exercise can cause discomfort but should not be painful. It is normal to be a little stiff or sore after exercising. Stretching and range-of-motion exercises Front thigh stretch  Stand up straight and support your body by holding on to a chair or resting one hand on a wall. With your legs straight and close together, bend one knee to lift your heel up toward your butt. Using one hand for support, grab your ankle with your free hand. Pull your foot up closer toward your butt to feel the stretch in front of your thigh. Hold the stretch for 30 seconds. Repeat __________ times. Complete this exercise __________ times a day. Back thigh stretch  Sit on the floor with your back straight and your legs out straight in front of you. Place the palms of your hands on the floor and slide them toward your feet as you bend at the  hip. Try to touch your nose to your knees and feel the stretch in the back of your thighs. Hold for 30 seconds. Repeat __________ times. Complete this exercise __________ times a day. Calf stretch  Stand facing a wall. Place the palms of your hands flat against the wall, arms extended, and lean slightly against the wall. Get into a lunge position with one leg bent at the knee and the other leg stretched out straight behind you. Keep both feet facing the wall and increase the bend in your knee while keeping the heel of the other leg flat on the ground. You should feel the stretch in your calf. Hold for 30 seconds. Repeat __________ times. Complete this exercise __________ times a day. Strengthening exercises Straight leg lift  Lie on your back with one knee bent and the other leg out straight. Slowly lift the straight leg without bending the knee. Lift until your foot is about 12 inches (30 cm) off the floor. Hold for 3-5 seconds and slowly lower your leg. Repeat __________ times. Complete this exercise __________ times a day. Single leg dip  Stand between two chairs and put both hands on the backs of the chairs for support. Extend one leg out straight with your body weight resting on the heel of the standing leg. Slowly bend your standing knee to dip your body to the level that is comfortable for you. Hold for 3-5 seconds. Repeat __________ times. Complete this exercise __________ times a day. Hamstring curls  Stand straight, knees close together, facing the back of a chair. Hold on to the back of a chair with both hands. Keep one leg straight. Bend the other knee while bringing the heel up toward the butt until the knee is bent at a 90-degree angle (right angle). Hold for 3-5 seconds. Repeat __________ times. Complete this exercise __________ times a day. Wall squat  Stand straight with your back, hips, and head against a wall. Step forward one foot at a time with your back  still against the wall. Your feet should be 2 feet (61 cm) from the wall at shoulder width. Keeping your back, hips, and head against the wall, slide down the wall to as close to a sitting position as you can get. Hold for 5-10 seconds, then slowly slide back up. Repeat __________ times. Complete this exercise __________ times a day. Step-ups  Stand in front of a sturdy platform or stool that is about 6 inches (15 cm) high. Slowly step up with your left / right foot, keeping your knee in line with your  hip and foot. Do not let your knee bend so far that you cannot see your toes. Hold on to a chair for balance, but do not use it for support. Slowly unlock your knee and lower yourself to the starting position. Repeat __________ times. Complete this exercise __________ times a day. Contact a health care provider if: Your exercises cause pain. Your pain is worse after you exercise. Your pain prevents you from doing your exercises. This information is not intended to replace advice given to you by your health care provider. Make sure you discuss any questions you have with your health care provider. Document Revised: 07/27/2022 Document Reviewed: 07/27/2022 Elsevier Patient Education  2024 [employer]  Osteoarthritis  Osteoarthritis is a type of arthritis. It refers to joint pain or joint disease. Osteoarthritis affects tissue that covers the ends of bones in joints (cartilage). Cartilage acts as a cushion between the bones and helps them move smoothly. Osteoarthritis occurs when cartilage in the joints gets worn down. Osteoarthritis is sometimes called "wear and tear" arthritis. Osteoarthritis is the most common form of arthritis. It often occurs in older people. It is a condition that gets worse over time. The joints most often affected by this condition are in the fingers, toes, hips, knees, and spine, including the neck and lower back. What are the causes? This condition is caused by the  wearing down of cartilage that covers the ends of bones. What increases the risk? The following factors may make you more likely to develop this condition: Being age 15 or older. Obesity. Overuse of joints. Past injury of a joint. Past surgery on a joint. Family history of osteoarthritis. What are the signs or symptoms? The main symptoms of this condition are pain, swelling, and stiffness in the joint. Other symptoms may include: An enlarged joint. More pain and further damage caused by small pieces of bone or cartilage that break off and float inside of the joint. Small deposits of bone (osteophytes) that grow on the edges of the joint. A grating or scraping feeling inside the joint when you move it. Popping or creaking sounds when you move. Difficulty walking or exercising. An inability to grip items, twist your hand, or control the movements of your hands and fingers. How is this diagnosed? This condition may be diagnosed based on: Your medical history. A physical exam. Your symptoms. X-rays of the affected joints. Blood tests to rule out other types of arthritis. How is this treated? There is no cure for this condition, but treatment can help control pain and improve joint function. Treatment may include a combination of therapies, such as: Pain relief techniques, such as: Applying heat and cold to the joint. Massage. A form of talk therapy called cognitive behavioral therapy (CBT). This therapy helps you set goals and follow up on the changes that you make. Medicines for pain and inflammation. The medicines can be taken by mouth or applied to the skin. They include: NSAIDs, such as ibuprofen. Prescription medicines. Strong anti-inflammatory medicines (corticosteroids). Certain nutritional supplements. A prescribed exercise program. You may work with a physical therapist. Assistive devices, such as a brace, wrap, splint, specialized glove, or cane. A weight control  plan. Surgery, such as: An osteotomy. This is done to reposition the bones and relieve pain or to remove loose pieces of bone and cartilage. Joint replacement surgery. You may need this surgery if you have advanced osteoarthritis. Follow these instructions at home: Activity Rest your affected joints as told by your health  care provider. Exercise as told by your provider. The provider may recommend specific types of exercise, such as: Strengthening exercises. These are done to strengthen the muscles that support joints affected by arthritis. Aerobic activities. These are exercises, such as brisk walking or water aerobics, that increase your heart rate. Range-of-motion activities. These help your joints move more easily. Balance and agility exercises. Managing pain, stiffness, and swelling     If told, apply heat to the affected area as often as told by your provider. Use the heat source that your provider recommends, such as a moist heat pack or a heating pad. If you have a removable assistive device, remove it as told by your provider. Place a towel between your skin and the heat source. If your provider tells you to keep the assistive device on while you apply heat, place a towel between the assistive device and the heat source. Leave the heat on for 20-30 minutes. If told, put ice on the affected area. If you have a removable assistive device, remove it as told by your provider. Put ice in a plastic bag. Place a towel between your skin and the bag. If your provider tells you to keep the assistive device on during icing, place a towel between the assistive device and the bag. Leave the ice on for 20 minutes, 2-3 times a day. If your skin turns bright red, remove the ice or heat right away to prevent skin damage. The risk of damage is higher if you cannot feel pain, heat, or cold. Move your fingers or toes often to reduce stiffness and swelling. Raise (elevate) the affected area above the  level of your heart while you are sitting or lying down. General instructions Take over-the-counter and prescription medicines only as told by your provider. Maintain a healthy weight. Follow instructions from your provider for weight control. Do not use any products that contain nicotine or tobacco. These products include cigarettes, chewing tobacco, and vaping devices, such as e-cigarettes. If you need help quitting, ask your provider. Use assistive devices as told by your provider. Where to find more information [employer] of Arthritis and Musculoskeletal and Skin Diseases: niams.http://www.myers.net/ [employer] on Aging: BaseRingTones.pl American College of Rheumatology: rheumatology.org Contact a health care provider if: You have redness, swelling, or a feeling of warmth in a joint that gets worse. You have a fever along with joint or muscle aches. You develop a rash. You have trouble doing your normal activities. You have pain that gets worse and is not relieved by pain medicine. This information is not intended to replace advice given to you by your health care provider. Make sure you discuss any questions you have with your health care provider. Document Revised: 03/11/2022 Document Reviewed: 03/11/2022 Elsevier Patient Education  2024 [employer].

## 2023-09-17 ENCOUNTER — Other Ambulatory Visit: Payer: Self-pay | Admitting: Family

## 2023-09-17 DIAGNOSIS — I10 Essential (primary) hypertension: Secondary | ICD-10-CM

## 2023-10-31 ENCOUNTER — Ambulatory Visit (INDEPENDENT_AMBULATORY_CARE_PROVIDER_SITE_OTHER): Payer: Federal, State, Local not specified - PPO | Admitting: Family Medicine

## 2023-10-31 DIAGNOSIS — I10 Essential (primary) hypertension: Secondary | ICD-10-CM | POA: Diagnosis not present

## 2023-10-31 DIAGNOSIS — J302 Other seasonal allergic rhinitis: Secondary | ICD-10-CM

## 2023-10-31 LAB — COMPREHENSIVE METABOLIC PANEL WITH GFR
ALT: 17 U/L (ref 0–35)
AST: 17 U/L (ref 0–37)
Albumin: 4.3 g/dL (ref 3.5–5.2)
Alkaline Phosphatase: 59 U/L (ref 39–117)
BUN: 25 mg/dL — ABNORMAL HIGH (ref 6–23)
CO2: 31 meq/L (ref 19–32)
Calcium: 9.5 mg/dL (ref 8.4–10.5)
Chloride: 104 meq/L (ref 96–112)
Creatinine, Ser: 0.77 mg/dL (ref 0.40–1.20)
GFR: 76.75 mL/min (ref >60.00)
Glucose, Bld: 91 mg/dL (ref 70–99)
Potassium: 4.2 meq/L (ref 3.5–5.1)
Sodium: 142 meq/L (ref 135–145)
Total Bilirubin: 0.4 mg/dL (ref 0.2–1.2)
Total Protein: 7.5 g/dL (ref 6.0–8.3)

## 2023-10-31 MED ORDER — CETIRIZINE HCL 10 MG PO TABS
10.0000 mg | ORAL_TABLET | Freq: Every day | ORAL | 1 refills | Status: AC
Start: 2023-10-31 — End: ?

## 2023-10-31 MED ORDER — LISINOPRIL-HYDROCHLOROTHIAZIDE 10-12.5 MG PO TABS
1.0000 | ORAL_TABLET | Freq: Every day | ORAL | 1 refills | Status: DC
Start: 2023-10-31 — End: 2024-06-14

## 2023-10-31 NOTE — Progress Notes (Signed)
   Established Patient Office Visit   Subjective:  Patient ID: Frances Hunter, female    DOB: 12-12-1950  Age: 73 y.o. MRN: [id number]  Chief Complaint  Patient presents with   Medical Management of Chronic Issues    HPI HTN: Chronic. Patient takes Lisinopriol/HCTZ 10-12.5mg  daily. Patient reports she monitors her blood pressure at home. Ranges around 138/70s Denies chest pain, shortness of breath, headaches, dizziness, or lower extremity edema. Once or twice a month will have lightheadedness. Contributes to not eating as much or not drinking water as much.    Seasonal allergies: Chronic. Patient is prescribed Cetirizine 10mg  tablet as needed. Not took in a while. Also, using Flonase as needed.   ROS See HPI above     Objective:   BP 134/70   Pulse 88   Temp 97.9 F (36.6 C) (Oral)   Wt 178 lb (80.7 kg)   SpO2 98%   BMI 31.04 kg/m    Physical Exam Vitals reviewed.  Constitutional:      General: She is not in acute distress.    Appearance: Normal appearance. She is obese. She is not ill-appearing, toxic-appearing or diaphoretic.  HENT:     Head: Normocephalic and atraumatic.  Eyes:     General:        Right eye: No discharge.        Left eye: No discharge.     Conjunctiva/sclera: Conjunctivae normal.  Cardiovascular:     Rate and Rhythm: Normal rate and regular rhythm.     Heart sounds: Normal heart sounds. No murmur heard.    No friction rub. No gallop.  Pulmonary:     Effort: Pulmonary effort is normal. No respiratory distress.     Breath sounds: Normal breath sounds.  Musculoskeletal:        General: Normal range of motion.     Right lower leg: No edema.     Left lower leg: No edema.  Skin:    General: Skin is warm and dry.  Neurological:     General: No focal deficit present.     Mental Status: She is alert and oriented to person, place, and time. Mental status is at baseline.  Psychiatric:        Mood and Affect: Mood normal.        Behavior: Behavior  normal.        Thought Content: Thought content normal.        Judgment: Judgment normal.      Assessment & Plan:  Essential hypertension Assessment & Plan: Blood pressure is stable. Controlled. Continue with Lisinopril/HCTZ 10-12.5mg  daily. Ordered CMP to assess kidney and potassium level.   Orders: -     Lisinopril-hydroCHLOROthiazide; Take 1 tablet by mouth daily.  Dispense: 90 tablet; Refill: 1 -     Comprehensive metabolic panel with GFR  Seasonal allergies Assessment & Plan: Stable. Continue with Cetirizine 10mg  daily and Flonase as needed. Refilled Cetirizine.   Orders: -     Cetirizine HCl; Take 1 tablet (10 mg total) by mouth daily.  Dispense: 90 tablet; Refill: 1   Return in about 3 months (around 01/30/2024) for physical; [facility]-telehealth .   Zandra Abts, NP

## 2023-10-31 NOTE — Patient Instructions (Addendum)
-  It was great to see you today.  -Blood pressure is stable. Continue with Lisinopril/Hydrochlorothiazide 10-12.5mg  daily.  -Continue with Cetirizine 10mg  daily and Flonase as needed. Refilled Cetirizine.  -Ordered labs. Office will call with lab results.  -Follow up in 3 months for a physical and Annual Wellness Exam via telehealth.

## 2023-10-31 NOTE — Assessment & Plan Note (Signed)
 Stable. Continue with Cetirizine 10mg  daily and Flonase as needed. Refilled Cetirizine.

## 2023-10-31 NOTE — Assessment & Plan Note (Signed)
Blood pressure is stable. Controlled. Continue with Lisinopril/HCTZ 10-12.5mg  daily. Ordered CMP to assess kidney and potassium level.

## 2023-11-01 ENCOUNTER — Encounter: Payer: Self-pay | Admitting: Family Medicine

## 2023-11-15 ENCOUNTER — Encounter

## 2024-01-30 NOTE — Progress Notes (Signed)
 Office Visit Note  Patient: [name]             Date of Birth: 1950-08-12           MRN: [id number]             PCP: Billy Philippe JONELLE, NP Referring: Billy Philippe JONELLE, NP Visit Date: 02/13/2024 Occupation: @GUAROCC @  Subjective:  Right knee pain  History of Present Illness: Frances Hunter is a 73 y.o. female with osteoarthritis and osteopenia.  She returns today after her last visit in January 2025.  She states she had a good response to right knee joint cortisone injection but now the pain has recurred.  She would like to have repeat cortisone injection.  She continues to have some stiffness in her hands but the trigger finger has completely resolved after the cortisone injection.  None of the other joints are painful or swollen.  She continues to take calcium and vitamin D .  She states she forgot to take her blood pressure medication this morning.    Activities of Daily Living:  Patient reports morning stiffness for less than 5 minutes.   Patient Reports nocturnal pain.  Difficulty dressing/grooming: Denies Difficulty climbing stairs: Denies Difficulty getting out of chair: Denies Difficulty using hands for taps, buttons, cutlery, and/or writing: Denies  Review of Systems  Constitutional:  Negative for fatigue.  HENT:  Negative for mouth sores and mouth dryness.   Eyes:  Positive for dryness.  Respiratory:  Negative for shortness of breath.   Cardiovascular:  Negative for chest pain and palpitations.  Gastrointestinal:  Negative for blood in stool, constipation and diarrhea.  Endocrine: Negative for increased urination.  Genitourinary:  Negative for involuntary urination.  Musculoskeletal:  Positive for joint pain, joint pain, joint swelling and morning stiffness. Negative for gait problem, myalgias, muscle weakness, muscle tenderness and myalgias.  Skin:  Positive for sensitivity to sunlight. Negative for color change, rash and hair loss.   Allergic/Immunologic: Negative for susceptible to infections.  Neurological:  Negative for dizziness and headaches.  Hematological:  Negative for swollen glands.  Psychiatric/Behavioral:  Negative for depressed mood and sleep disturbance. The patient is not nervous/anxious.     PMFS History:  Patient Active Problem List   Diagnosis Date Noted   Obesity (BMI 30-39.9) 06/03/2022   Osteopenia    Osteopenia of thigh 07/07/2015   Chronic venous insufficiency 07/12/2014   Essential hypertension 12/10/2012   GERD (gastroesophageal reflux disease) 12/10/2012   Seasonal allergies 12/10/2012   Varicose veins of bilateral lower extremities with pain 08/24/2008    Past Medical History:  Diagnosis Date   Arthritis    GERD (gastroesophageal reflux disease)    Hypertension    Osteopenia    low Frax score, jan 2019   Seasonal allergies     Family History  Problem Relation Age of Onset   Liver disease Mother 38       cyst   Hypertension Father    Lymphoma Father 44       lymphoma   Hyperlipidemia Sister    Diabetes Brother    Hyperlipidemia Brother    Hypertension Brother    Diabetes Maternal Grandfather    Lymphoma Paternal Grandfather    Hypertension Daughter    Breast cancer Neg Hx    Past Surgical History:  Procedure Laterality Date   CATARACT EXTRACTION, BILATERAL  04/2023   TUBAL LIGATION     Social History   Social History Narrative   Married.  College: Yes. Exercise: Yes.   Immunization History  Administered Date(s) Administered   Fluad Quad(high Dose 65+) 03/27/2019, 03/27/2020, 03/27/2021   Fluad Trivalent(High Dose 65+) 05/09/2023   Influenza, High Dose Seasonal PF 09/25/2018, 03/26/2020, 06/03/2022   Influenza,inj,Quad PF,6+ Mos 05/17/2014, 05/22/2015, 05/27/2016, 06/11/2017   Moderna Covid-19 Fall Seasonal Vaccine 55yrs & older 05/09/2023   Pneumococcal Conjugate-13 05/27/2016   Pneumococcal Polysaccharide-23 02/23/2010, 06/11/2017   Tdap 02/24/2007,  05/22/2015   Zoster Recombinant(Shingrix) 05/14/2021, 09/17/2021     Objective: Vital Signs: BP (!) 148/74 (BP Location: Left Arm, Patient Position: Sitting, Cuff Size: Normal)   Pulse 73   Resp 14   Ht 5' 3.5 (1.613 m)   Wt 178 lb (80.7 kg)   BMI 31.04 kg/m    Physical Exam Vitals and nursing note reviewed.  Constitutional:      Appearance: She is well-developed.  HENT:     Head: Normocephalic and atraumatic.  Eyes:     Conjunctiva/sclera: Conjunctivae normal.  Cardiovascular:     Rate and Rhythm: Normal rate and regular rhythm.     Heart sounds: Normal heart sounds.  Pulmonary:     Effort: Pulmonary effort is normal.     Breath sounds: Normal breath sounds.  Abdominal:     General: Bowel sounds are normal.     Palpations: Abdomen is soft.  Musculoskeletal:     Cervical back: Normal range of motion.  Lymphadenopathy:     Cervical: No cervical adenopathy.  Skin:    General: Skin is warm and dry.     Capillary Refill: Capillary refill takes less than 2 seconds.  Neurological:     Mental Status: She is alert and oriented to person, place, and time.  Psychiatric:        Behavior: Behavior normal.      Musculoskeletal Exam: Cervical spine was in good range of motion.  She had no tenderness over thoracic or lumbar spine.  Shoulders, elbows, wrist joints, MCPs PIPs and DIPs were in good range of motion.  She had bilateral PIP and DIP thickening.  She good range of motion of bilateral hip joints and knee joints.  She had discomfort range of motion of her right knee joint without any warmth swelling or effusion.  No tenderness over ankles or MTPs.  CDAI Exam: CDAI Score: -- Patient Global: --; Provider Global: -- Swollen: --; Tender: -- Joint Exam 02/13/2024   No joint exam has been documented for this visit   There is currently no information documented on the homunculus. Go to the Rheumatology activity and complete the homunculus joint exam.  Investigation: No  additional findings.  Imaging: No results found.  Recent Labs: Lab Results  Component Value Date   WBC 6.9 06/03/2022   HGB 12.8 06/03/2022   PLT 268.0 06/03/2022   NA 142 10/31/2023   K 4.2 10/31/2023   CL 104 10/31/2023   CO2 31 10/31/2023   GLUCOSE 91 10/31/2023   BUN 25 (H) 10/31/2023   CREATININE 0.77 10/31/2023   BILITOT 0.4 10/31/2023   ALKPHOS 59 10/31/2023   AST 17 10/31/2023   ALT 17 10/31/2023   PROT 7.5 10/31/2023   ALBUMIN 4.3 10/31/2023   CALCIUM 9.5 10/31/2023   GFRAA 88 03/27/2020    Speciality Comments: No specialty comments available.  Procedures:  Large Joint Inj: R knee on 02/13/2024 12:12 PM Indications: pain Details: 27 G 1.5 in needle, medial approach  Arthrogram: No  Medications: 40 mg triamcinolone  acetonide 40 MG/ML; 1.5 mL lidocaine   1 % Aspirate: 0 mL Outcome: tolerated well, no immediate complications  Risk of infection, tendon injury, nerve injury, hypopigmentation and dermal atrophy were discussed. Procedure, treatment alternatives, risks and benefits explained, specific risks discussed. Consent was given by the patient. Immediately prior to procedure a time out was called to verify the correct patient, procedure, equipment, support staff and site/side marked as required. Patient was prepped and draped in the usual sterile fashion.     Allergies: Patient has no known allergies.   Assessment / Plan:     Visit Diagnoses: Primary osteoarthritis of both hands-she continues to have some discomfort in her hands.  No synovitis was noted.  She had bilateral PIP and DIP thickening suggestive of osteoarthritis.  Trigger finger, right ring finger - Injected September 22, 2022.  Recurrence.  Primary osteoarthritis of both knees - Status post right knee injection August 26, 2023.  She had good response to cortisone injection.  She said the pain has recurred now.  She would like to have repeat injection.  After informed consent was obtained and side  effects were discussed right knee joint was injected with lidocaine  and Kenalog  as described above.  She tolerated the procedure well.  Postprocedure instructions were given.  Will consider applying for viscosupplement injections.  This patient is diagnosed with osteoarthritis of the knee(s).    Radiographs show evidence of joint space narrowing, osteophytes, subchondral sclerosis and/or subchondral cysts.  This patient has knee pain which interferes with functional and activities of daily living.    This patient has experienced inadequate response, adverse effects and/or intolerance with conservative treatments such as acetaminophen , NSAIDS, topical creams, physical therapy or regular exercise, knee bracing and/or weight loss.   This patient has experienced inadequate response or has a contraindication to intra articular steroid injections for at least 3 months.   This patient is not scheduled to have a total knee replacement within 6 months of starting treatment with viscosupplementation.   Osteopenia of neck of right femur - June 10, 2023 DEXA scan: The BMD measured at Femur Neck Right is 0.807 g/cm2 with a T-score of -1.7.  There is no sig change when compared to the DEXA 2021.  Calcium rich diet vitamin D  was advised.  Need for regular exercise was discussed.  She will need repeat DEXA scan after November 2026.  Essential hypertension-blood pressure was elevated 148/74.  Repeat blood pressure was 158/70.  Patient states she did not take her blood pressure medication this morning.  She will take her blood pressure medication when she gets home.  Further medical problems are listed as follows:  Gastroesophageal reflux disease without esophagitis  Varicose veins of bilateral lower extremities with pain  Seasonal allergies  Orders: No orders of the defined types were placed in this encounter.  No orders of the defined types were placed in this encounter.    Follow-Up Instructions:  Return in about 6 months (around 08/15/2024) for Osteoarthritis.   Maya Nash, MD  Note - This record has been created using Animal nutritionist.  Chart creation errors have been sought, but may not always  have been located. Such creation errors do not reflect on  the standard of medical care.

## 2024-02-13 ENCOUNTER — Telehealth: Payer: Self-pay | Admitting: Rheumatology

## 2024-02-13 ENCOUNTER — Encounter: Payer: Self-pay | Admitting: Rheumatology

## 2024-02-13 ENCOUNTER — Ambulatory Visit: Attending: Rheumatology | Admitting: Rheumatology

## 2024-02-13 VITALS — BP 158/70 | HR 72 | Resp 14 | Ht 63.5 in | Wt 178.0 lb

## 2024-02-13 DIAGNOSIS — I83813 Varicose veins of bilateral lower extremities with pain: Secondary | ICD-10-CM

## 2024-02-13 DIAGNOSIS — R700 Elevated erythrocyte sedimentation rate: Secondary | ICD-10-CM

## 2024-02-13 DIAGNOSIS — I10 Essential (primary) hypertension: Secondary | ICD-10-CM

## 2024-02-13 DIAGNOSIS — J302 Other seasonal allergic rhinitis: Secondary | ICD-10-CM | POA: Diagnosis not present

## 2024-02-13 DIAGNOSIS — M1711 Unilateral primary osteoarthritis, right knee: Secondary | ICD-10-CM | POA: Diagnosis not present

## 2024-02-13 DIAGNOSIS — M65341 Trigger finger, right ring finger: Secondary | ICD-10-CM

## 2024-02-13 DIAGNOSIS — M170 Bilateral primary osteoarthritis of knee: Secondary | ICD-10-CM

## 2024-02-13 DIAGNOSIS — K219 Gastro-esophageal reflux disease without esophagitis: Secondary | ICD-10-CM | POA: Diagnosis not present

## 2024-02-13 DIAGNOSIS — M19041 Primary osteoarthritis, right hand: Secondary | ICD-10-CM | POA: Diagnosis not present

## 2024-02-13 DIAGNOSIS — M19042 Primary osteoarthritis, left hand: Secondary | ICD-10-CM | POA: Diagnosis not present

## 2024-02-13 DIAGNOSIS — M85851 Other specified disorders of bone density and structure, right thigh: Secondary | ICD-10-CM

## 2024-02-13 MED ORDER — TRIAMCINOLONE ACETONIDE 40 MG/ML IJ SUSP
40.0000 mg | INTRAMUSCULAR | Status: AC | PRN
  Administered 2024-02-13: 40 mg via INTRA_ARTICULAR

## 2024-02-13 MED ORDER — LIDOCAINE HCL 1 % IJ SOLN
1.5000 mL | INTRAMUSCULAR | Status: AC | PRN
Start: 2024-02-13 — End: 2024-02-13
  Administered 2024-02-13: 1.5 mL

## 2024-02-13 NOTE — Telephone Encounter (Signed)
VOB submitted for Orthovisc, Right knee(s) BV pending 

## 2024-02-13 NOTE — Telephone Encounter (Signed)
 Please call to schedule visco injections.  Approved for Orthovisc, Right knee(s). [employer] Deductible has been met $257(MET: $257) patient is responsible for coinsurance.  no co-pay Deductible  apply No pre-certifications  Secondary Deductible does not apply The drug is covered at 100%  Confirmed with Cecilio Ref [id number]

## 2024-02-13 NOTE — Telephone Encounter (Signed)
-----   Message from [facility] Francina V sent at 02/13/2024 12:18 PM EDT ----- Can you please apply for Visco for right knee,  per Maya Nash. Thank you

## 2024-02-13 NOTE — Patient Instructions (Signed)
 Exercises for Chronic Knee Pain  Chronic knee pain is pain that lasts longer than 3 months. For most people with chronic knee pain, exercise and weight loss is an important part of treatment. Your health care provider may want you to focus on:  Making the muscles that support your knee stronger. This can take pressure off your knee and reduce pain.  Preventing knee stiffness.  How far you can move your knee, keeping it there or making it farther.  Losing weight (if this applies) to take pressure off your knee, lower your risk for injury, and make it easier for you to exercise.  Your provider will help you make an exercise program that fits your needs and physical abilities. Below are simple, low-impact exercises you can do at home. Ask your provider or physical therapist how often you should do your exercise program and how many times to repeat each exercise.  General safety tips    Get your provider's approval before doing any exercises.  Start slowly and stop any time you feel pain.  Do not exercise if your knee pain is flaring up.  Warm up first. Stretching a cold muscle can cause an injury. Do 5-10 minutes of easy movement or light stretching before beginning your exercises.  Do 5-10 minutes of low-impact activity (like walking or cycling) before starting strengthening exercises.  Contact your provider any time you have pain during or after exercising. Exercise can cause discomfort but should not be painful. It is normal to be a little stiff or sore after exercising.  Stretching and range-of-motion exercises  Front thigh stretch    Stand up straight and support your body by holding on to a chair or resting one hand on a Mastandrea.  With your legs straight and close together, bend one knee to lift your heel up toward your butt.  Using one hand for support, grab your ankle with your free hand.  Pull your foot up closer toward your butt to feel the stretch in front of your thigh.  Hold the stretch for 30  seconds.  Repeat __________ times. Complete this exercise __________ times a day.  Back thigh stretch    Sit on the floor with your back straight and your legs out straight in front of you.  Place the palms of your hands on the floor and slide them toward your feet as you bend at the hip.  Try to touch your nose to your knees and feel the stretch in the back of your thighs.  Hold for 30 seconds.  Repeat __________ times. Complete this exercise __________ times a day.  Calf stretch    Stand facing a Sheehy.  Place the palms of your hands flat against the Ghazi, arms extended, and lean slightly against the Muha.  Get into a lunge position with one leg bent at the knee and the other leg stretched out straight behind you.  Keep both feet facing the Ziesmer and increase the bend in your knee while keeping the heel of the other leg flat on the ground.  You should feel the stretch in your calf. Hold for 30 seconds.  Repeat __________ times. Complete this exercise __________ times a day.  Strengthening exercises  Straight leg lift    Lie on your back with one knee bent and the other leg out straight.  Slowly lift the straight leg without bending the knee.  Lift until your foot is about 12 inches (30 cm) off the floor.  Hold for  3-5 seconds and slowly lower your leg.  Repeat __________ times. Complete this exercise __________ times a day.  Single leg dip    Stand between two chairs and put both hands on the backs of the chairs for support.  Extend one leg out straight with your body weight resting on the heel of the standing leg.  Slowly bend your standing knee to dip your body to the level that is comfortable for you.  Hold for 3-5 seconds.  Repeat __________ times. Complete this exercise __________ times a day.  Hamstring curls    Stand straight, knees close together, facing the back of a chair.  Hold on to the back of a chair with both hands.  Keep one leg straight. Bend the other knee while bringing the heel up toward the butt  until the knee is bent at a 90-degree angle (right angle).  Hold for 3-5 seconds.  Repeat __________ times. Complete this exercise __________ times a day.  Neece squat    Stand straight with your back, hips, and head against a Rushing.  Step forward one foot at a time with your back still against the Spargur.  Your feet should be 2 feet (61 cm) from the Jeanpaul at shoulder width. Keeping your back, hips, and head against the Colligan, slide down the Brenner to as close to a sitting position as you can get.  Hold for 5-10 seconds, then slowly slide back up.  Repeat __________ times. Complete this exercise __________ times a day.  Step-ups    Stand in front of a sturdy platform or stool that is about 6 inches (15 cm) high.  Slowly step up with your left / right foot, keeping your knee in line with your hip and foot. Do not let your knee bend so far that you cannot see your toes. Hold on to a chair for balance, but do not use it for support.  Slowly unlock your knee and lower yourself to the starting position.  Repeat __________ times. Complete this exercise __________ times a day.  Contact a health care provider if:  Your exercises cause pain.  Your pain is worse after you exercise.  Your pain prevents you from doing your exercises.  This information is not intended to replace advice given to you by your health care provider. Make sure you discuss any questions you have with your health care provider.  Document Revised: 07/27/2022 Document Reviewed: 07/27/2022  Elsevier Patient Education  2024 [employer].

## 2024-02-16 ENCOUNTER — Other Ambulatory Visit: Payer: Self-pay | Admitting: Family Medicine

## 2024-02-16 DIAGNOSIS — Z1231 Encounter for screening mammogram for malignant neoplasm of breast: Secondary | ICD-10-CM

## 2024-02-23 ENCOUNTER — Ambulatory Visit: Attending: Physician Assistant | Admitting: Physician Assistant

## 2024-02-23 DIAGNOSIS — G8929 Other chronic pain: Secondary | ICD-10-CM | POA: Diagnosis not present

## 2024-02-23 DIAGNOSIS — M1711 Unilateral primary osteoarthritis, right knee: Secondary | ICD-10-CM

## 2024-02-23 DIAGNOSIS — M25561 Pain in right knee: Secondary | ICD-10-CM

## 2024-02-23 MED ORDER — HYALURONAN 30 MG/2ML IX SOSY
30.0000 mg | PREFILLED_SYRINGE | INTRA_ARTICULAR | Status: AC | PRN
Start: 2024-02-23 — End: 2024-02-23
  Administered 2024-02-23: 30 mg via INTRA_ARTICULAR

## 2024-02-23 MED ORDER — LIDOCAINE HCL 1 % IJ SOLN
1.5000 mL | INTRAMUSCULAR | Status: AC | PRN
Start: 2024-02-23 — End: 2024-02-23
  Administered 2024-02-23: 1.5 mL

## 2024-02-23 NOTE — Progress Notes (Signed)
   Procedure Note  Patient: Frances Hunter             Date of Birth: 1951/03/25           MRN: [id number]             Visit Date: 02/23/2024  Procedures: Visit Diagnoses:  1. Chronic pain of right knee   2. Primary osteoarthritis of right knee     Large Joint Inj: R knee on 02/23/2024 9:17 AM Indications: pain Details: 27 G 1.5 in needle, medial approach  Arthrogram: No  Medications: 1.5 mL lidocaine  1 %; 30 mg Hyaluronan 30 MG/2ML Aspirate: 0 mL Outcome: tolerated well, no immediate complications Procedure, treatment alternatives, risks and benefits explained, specific risks discussed. Consent was given by the patient. Immediately prior to procedure a time out was called to verify the correct patient, procedure, equipment, support staff and site/side marked as required. Patient was prepped and draped in the usual sterile fashion.    Patient tolerated the procedure well.  Aftercare was discussed.  Waddell Craze, PA-C

## 2024-02-24 ENCOUNTER — Ambulatory Visit: Payer: Medicare Other | Admitting: Rheumatology

## 2024-03-01 ENCOUNTER — Ambulatory Visit: Attending: Physician Assistant | Admitting: Physician Assistant

## 2024-03-01 ENCOUNTER — Ambulatory Visit
Admission: RE | Admit: 2024-03-01 | Discharge: 2024-03-01 | Disposition: A | Discharge status: Home / Self Care | Source: Ambulatory Visit | Attending: Family Medicine

## 2024-03-01 DIAGNOSIS — M1711 Unilateral primary osteoarthritis, right knee: Secondary | ICD-10-CM

## 2024-03-01 DIAGNOSIS — Z1231 Encounter for screening mammogram for malignant neoplasm of breast: Secondary | ICD-10-CM | POA: Diagnosis not present

## 2024-03-01 MED ORDER — LIDOCAINE HCL 1 % IJ SOLN
1.5000 mL | INTRAMUSCULAR | Status: AC | PRN
Start: 2024-03-01 — End: 2024-03-01
  Administered 2024-03-01: 1.5 mL

## 2024-03-01 MED ORDER — HYALURONAN 30 MG/2ML IX SOSY
30.0000 mg | PREFILLED_SYRINGE | INTRA_ARTICULAR | Status: AC | PRN
Start: 2024-03-01 — End: 2024-03-01
  Administered 2024-03-01: 30 mg via INTRA_ARTICULAR

## 2024-03-01 NOTE — Progress Notes (Signed)
   Procedure Note  Patient: Frances Hunter             Date of Birth: [birth year]           MRN: [id number]             Visit Date: 03/01/2024  Procedures: #2 Orthovisc Right Knee B/B  Visit Diagnoses:  1. Primary osteoarthritis of right knee     Large Joint Inj: R knee on 03/01/2024 8:36 AM Indications: pain Details: 25 G 1.5 in needle, medial approach  Arthrogram: No  Medications: 1.5 mL lidocaine  1 %; 30 mg Hyaluronan 30 MG/2ML Aspirate: 0 mL Outcome: tolerated well, no immediate complications Procedure, treatment alternatives, risks and benefits explained, specific risks discussed. Consent was given by the patient.     Patient tolerated the procedure well.  Aftercare was discussed.  Waddell Craze, PA-C

## 2024-03-05 ENCOUNTER — Ambulatory Visit: Payer: Self-pay | Admitting: Family Medicine

## 2024-03-08 ENCOUNTER — Ambulatory Visit: Attending: Physician Assistant | Admitting: Physician Assistant

## 2024-03-08 DIAGNOSIS — M1711 Unilateral primary osteoarthritis, right knee: Secondary | ICD-10-CM

## 2024-03-08 MED ORDER — LIDOCAINE HCL 1 % IJ SOLN
1.5000 mL | INTRAMUSCULAR | Status: AC | PRN
Start: 2024-03-08 — End: 2024-03-08
  Administered 2024-03-08: 1.5 mL

## 2024-03-08 MED ORDER — HYALURONAN 30 MG/2ML IX SOSY
30.0000 mg | PREFILLED_SYRINGE | INTRA_ARTICULAR | Status: AC | PRN
Start: 2024-03-08 — End: 2024-03-08
  Administered 2024-03-08: 30 mg via INTRA_ARTICULAR

## 2024-03-08 NOTE — Progress Notes (Signed)
   Procedure Note  Patient: Frances Hunter             Date of Birth: [birth year]           MRN: [id number]             Visit Date: 03/08/2024  Procedures:  Orthovisc #3 Right Knee B/B Visit Diagnoses:  1. Primary osteoarthritis of right knee     Large Joint Inj: R knee on 03/08/2024 8:55 AM Indications: pain Details: 25 G 1.5 in needle, medial approach  Arthrogram: No  Medications: 1.5 mL lidocaine  1 %; 30 mg Hyaluronan 30 MG/2ML Aspirate: 0 mL Outcome: tolerated well, no immediate complications Procedure, treatment alternatives, risks and benefits explained, specific risks discussed. Consent was given by the patient.       Patient tolerated the procedure well.  Aftercare was discussed.  Waddell Craze, PA-C

## 2024-03-13 ENCOUNTER — Encounter: Payer: Self-pay | Admitting: Family Medicine

## 2024-03-13 ENCOUNTER — Ambulatory Visit (INDEPENDENT_AMBULATORY_CARE_PROVIDER_SITE_OTHER): Admitting: Family Medicine

## 2024-03-13 DIAGNOSIS — Z0000 Encounter for general adult medical examination without abnormal findings: Secondary | ICD-10-CM | POA: Diagnosis not present

## 2024-03-13 NOTE — Patient Instructions (Signed)
 I really enjoyed getting to talk with you today! I am available on Tuesdays and Thursdays for virtual visits if you have any questions or concerns, or if I can be of any further assistance.   CHECKLIST FROM ANNUAL WELLNESS VISIT:  -Follow up (please call to schedule if not scheduled after visit):   -yearly for annual wellness visit with primary care office  Here is a list of your preventive care/health maintenance measures and the plan for each if any are due:  PLAN For any measures below that may be due:   Health Maintenance  Topic Date Due   INFLUENZA VACCINE  03/25/2024 (Originally 02/24/2024)   Fecal DNA (Cologuard)  10/06/2024   MAMMOGRAM  03/01/2025   Medicare Annual Wellness (AWV)  03/13/2025   DTaP/Tdap/Td (3 - Td or Tdap) 05/21/2025   Pneumococcal Vaccine: 50+ Years  Completed   DEXA SCAN  Completed   Hepatitis C Screening  Completed   Zoster Vaccines- Shingrix  Completed   HPV VACCINES  Aged Out   Meningococcal B Vaccine  Aged Out   Pneumococcal Vaccine  Discontinued   COVID-19 Vaccine  Discontinued    -See a dentist at least yearly  -Get your eyes checked and then per your eye specialist's recommendations  -Other issues addressed today:   -I have included below further information regarding a healthy whole foods based diet, physical activity guidelines for adults, stress management and opportunities for social connections. I hope you find this information useful.   -----------------------------------------------------------------------------------------------------------------------------------------------------------------------------------------------------------------------------------------------------------    NUTRITION: -eat real food: lots of colorful vegetables (half the plate) and fruits -5-7 servings of vegetables and fruits per day (fresh or steamed is best), exp. 2 servings of vegetables with lunch and dinner and 2 servings of fruit per day. Berries  and greens such as kale and collards are great choices.  -consume on a regular basis:  fresh fruits, fresh veggies, fish, nuts, seeds, healthy oils (such as olive oil, avocado oil), whole grains (make sure for bread/pasta/crackers/etc., that the first ingredient on label contains the word whole), legumes. -can eat small amounts of dairy and lean meat (no larger than the palm of your hand), but avoid processed meats such as ham, bacon, lunch meat, etc. -drink water -try to avoid fast food and pre-packaged foods, processed meat, ultra processed foods/beverages (donuts, candy, etc.) -most experts advise limiting sodium to < 2300mg  per day, should limit further is any chronic conditions such as high blood pressure, heart disease, diabetes, etc. The American Heart Association advised that < 1500mg  is is ideal -try to avoid foods/beverages that contain any ingredients with names you do not recognize  -try to avoid foods/beverages  with added sugar or sweeteners/sweets  -try to avoid sweet drinks (including diet drinks): soda, juice, Gatorade, sweet tea, power drinks, diet drinks -try to avoid white rice, white bread, pasta (unless whole grain)  EXERCISE GUIDELINES FOR ADULTS: -if you wish to increase your physical activity, do so gradually and with the approval of your doctor -STOP and seek medical care immediately if you have any chest pain, chest discomfort or trouble breathing when starting or increasing exercise  -move and stretch your body, legs, feet and arms when sitting for long periods -Physical activity guidelines for optimal health in adults: -get at least 150 minutes per week of moderate exercise (can talk, but not sing); this is about 20-30 minutes of sustained activity 5-7 days per week or two 10-15 minute episodes of sustained activity 5-7 days per week -do some  muscle building/resistance training/strength training at least 2 days per week  -balance exercises 3+ days per week:   Stand  somewhere where you have something sturdy to hold onto if you lose balance    1) lift up on toes, then back down, start with 5x per day and work up to 20x   2) stand and lift one leg straight out to the side so that foot is a few inches of the floor, start with 5x each side and work up to 20x each side   3) stand on one foot, start with 5 seconds each side and work up to 20 seconds on each side  If you need ideas or help with getting more active:  -Silver sneakers https://tools.silversneakers.com  -Walk with a Doc: http://www.duncan-williams.[email or web address]/  -try to include resistance (weight lifting/strength building) and balance exercises twice per week: or the following link for ideas: http://castillo-powell.com/  BuyDucts.dk  STRESS MANAGEMENT: -can try meditating, or just sitting quietly with deep breathing while intentionally relaxing all parts of your body for 5 minutes daily -if you need further help with stress, anxiety or depression please follow up with your primary doctor or contact the wonderful folks at [facility]: [phone]  SOCIAL CONNECTIONS: -options in [place] if you wish to engage in more social and exercise related activities:  -Silver sneakers https://tools.silversneakers.com  -Walk with a Doc: http://www.duncan-williams.[email or web address]/  -Check out the [facility] Active Adults 50+ section on the [place] of [employer] (hiking clubs, book clubs, cards and games, chess, exercise classes, aquatic classes and much more) - see the website for details: https://www.-[place].gov/departments/parks-recreation/active-adults50  -YouTube has lots of exercise videos for different ages and abilities as well  -[facility] (a variety of indoor and outdoor inperson activities for adults). [phone]. [address]  -Virtual Online Classes (a variety of topics): see  seniorplanet.org or call [phone]  -consider volunteering at a school, hospice center, church, senior center or elsewhere

## 2024-03-13 NOTE — Progress Notes (Signed)
 PATIENT CHECK-IN and HEALTH RISK ASSESSMENT QUESTIONNAIRE:  -completed by phone/video for upcoming Medicare Preventive Visit   Pre-Visit Check-in: 1)Vitals (height, wt, BP, etc) - record in vitals section for visit on day of visit Request home vitals (wt, BP, etc.) and enter into vitals, THEN update Vital Signs SmartPhrase below at the top of the HPI. See below.  2)Review and Update Medications, Allergies PMH, Surgeries, Social history in Epic 3)Hospitalizations in the last year with date/reason? No   4)Review and Update Care Team (patient's specialists) in Epic 5) Complete PHQ9 in Epic  6) Complete Fall Screening in Epic 7)Review all Health Maintenance Due and order if not done.  Medicare Wellness Patient Questionnaire:  Answer theses question about your habits: How often do you have a drink containing alcohol?Yes, 2 times a month  How many drinks containing alcohol do you have on a typical day when you are drinking? 1 drink  How often do you have six or more drinks on one occasion?Never  Have you ever smoked?NO  Quit date if applicable? NA   How many packs a day do/did you smoke? NA  Do you use smokeless tobacco?NO Do you use an illicit drugs?NO  On average, how many days per week do you engage in moderate to strenuous exercise (like a brisk walk)?Yes 2 days a week, gardening every day On average, how many minutes do you engage in exercise at this level?30 minutes - exercise bike, also does some gardening Are you sexually active? NO Number of partners?NA  Typical breakfast: Fruit, or egg and whole wheat toast  Typical lunch:Nuts or fruit  Typical dinner:Meat Vegetables and starch  Typical snacks: Nuts, popcorn, she is trying to cut back chips   Beverages: Water, rarely OJ  Answer theses question about your everyday activities: Can you perform most household chores?Yes  Are you deaf or have significant trouble hearing?No  Do you feel that you have a problem with memory?NO  Do  you feel safe at home?NO  Last dentist visit?1 week ago  8. Do you have any difficulty performing your everyday activities?N o  Are you having any difficulty walking, taking medications on your own, and or difficulty managing daily home needs?NO  Do you have difficulty walking or climbing stairs?NO  Do you have difficulty dressing or bathing?NO  Do you have difficulty doing errands alone such as visiting a doctor's office or shopping?NO  Do you currently have any difficulty preparing food and eating?NO  Do you currently have any difficulty using the toilet?NO  Do you have any difficulty managing your finances?NO  Do you have any difficulties with housekeeping of managing your housekeeping?NO    Do you have Advanced Directives in place (Living Will, Healthcare Power or Attorney)? Yes    Last eye Exam and location?Feb 2025, [facility]    Do you currently use prescribed or non-prescribed narcotic or opioid pain medications?NO   Do you have a history or close family history of breast, ovarian, tubal or peritoneal cancer or a family member with BRCA (breast cancer susceptibility 1 and 2) gene mutations?NO    Nurse/Assistant Credentials/time stamp: Leah A.Wright CMA 3:38 pm     ----------------------------------------------------------------------------------------------------------------------------------------------------------------------------------------------------------------------  Because this visit was a virtual/telehealth visit, some criteria may be missing or patient reported. Any vitals not documented were not able to be obtained and vitals that have been documented are patient reported.    MEDICARE ANNUAL PREVENTIVE VISIT WITH PROVIDER: (Welcome to Medicare, initial annual wellness or annual wellness exam)  Virtual  Visit via Video Note  I connected with Frances Hunter on 03/13/24 by a video enabled telemedicine application and verified that I am speaking with the  correct person using two identifiers.  Location patient: home Location provider:work or home office Persons participating in the virtual visit: patient, provider  Concerns and/or follow up today: doing well - no concerns today.    See HM section in Epic for other details of completed HM.    ROS: negative for report of fevers, unintentional weight loss, vision changes, vision loss, hearing loss or change, chest pain, sob, hemoptysis, melena, hematochezia, hematuria, falls, bleeding or bruising, thoughts of suicide or self harm, memory loss  Patient-completed extensive health risk assessment - reviewed and discussed with the patient: See Health Risk Assessment completed with patient prior to the visit either above or in recent phone note. This was reviewed in detailed with the patient today and appropriate recommendations, orders and referrals were placed as needed per Summary below and patient instructions.   Review of Medical History: -PMH, PSH, Family History and current specialty and care providers reviewed and updated and listed below   Patient Care Team: Billy Philippe SAUNDERS, NP as PCP - General (Family Medicine) Dolphus Reiter, MD as Consulting Physician (Rheumatology)   Past Medical History:  Diagnosis Date   Arthritis    GERD (gastroesophageal reflux disease)    Hypertension    Osteopenia    low Frax score, jan 2019   Seasonal allergies     Past Surgical History:  Procedure Laterality Date   CATARACT EXTRACTION, BILATERAL  04/2023   TUBAL LIGATION      Social History   Socioeconomic History   Marital status: Married    Spouse name: Not on file   Number of children: 3   Years of education: Not on file   Highest education level: Bachelor's degree (e.g., BA, AB, BS)  Occupational History   Occupation: Retired  Tobacco Use   Smoking status: Never    Passive exposure: Never   Smokeless tobacco: Never  Vaping Use   Vaping status: Never Used  Substance and  Sexual Activity   Alcohol use: Yes    Alcohol/week: 1.0 standard drink of alcohol    Types: 1 Glasses of wine per week    Comment: 2 x a month   Drug use: No   Sexual activity: Not Currently  Other Topics Concern   Not on file  Social History Narrative   Married. College: Yes. Exercise: Yes.   Social Drivers of Corporate investment banker Strain: Low Risk  (03/12/2024)   Overall Financial Resource Strain (CARDIA)    Difficulty of Paying Living Expenses: Not hard at all  Food Insecurity: No Food Insecurity (03/12/2024)   Hunger Vital Sign    Worried About Running Out of Food in the Last Year: Never true    Ran Out of Food in the Last Year: Never true  Transportation Needs: No Transportation Needs (03/12/2024)   PRAPARE - Administrator, Civil Service (Medical): No    Lack of Transportation (Non-Medical): No  Physical Activity: Insufficiently Active (03/12/2024)   Exercise Vital Sign    Days of Exercise per Week: 2 days    Minutes of Exercise per Session: 40 min  Stress: No Stress Concern Present (03/12/2024)   [employer] of Occupational Health - Occupational Stress Questionnaire    Feeling of Stress: Not at all  Social Connections: Socially Integrated (03/12/2024)   Social Connection and Isolation  Panel    Frequency of Communication with Friends and Family: More than three times a week    Frequency of Social Gatherings with Friends and Family: Three times a week    Attends Religious Services: More than 4 times per year    Active Member of Clubs or Organizations: Yes    Attends Banker Meetings: 1 to 4 times per year    Marital Status: Married  Catering manager Violence: Not At Risk (11/04/2022)   Humiliation, Afraid, Rape, and Kick questionnaire    Fear of Current or Ex-Partner: No    Emotionally Abused: No    Physically Abused: No    Sexually Abused: No    Family History  Problem Relation Age of Onset   Liver disease Mother 17       cyst    Hypertension Father    Lymphoma Father 68       lymphoma   Hyperlipidemia Sister    Diabetes Brother    Hyperlipidemia Brother    Hypertension Brother    Diabetes Maternal Grandfather    Lymphoma Paternal Grandfather    Hypertension Daughter    Breast cancer Neg Hx     Current Outpatient Medications on File Prior to Visit  Medication Sig Dispense Refill   calcium-vitamin D  (OSCAL WITH D) 500-5 MG-MCG tablet Take 1 tablet by mouth.     cetirizine  (ZYRTEC ) 10 MG tablet Take 1 tablet (10 mg total) by mouth daily. 90 tablet 1   fluticasone  (FLONASE ) 50 MCG/ACT nasal spray SPRAY 2 SPRAYS INTO EACH NOSTRIL EVERY DAY (Patient taking differently: as needed.) 48 mL 2   lisinopril -hydrochlorothiazide  (ZESTORETIC ) 10-12.5 MG tablet Take 1 tablet by mouth daily. 90 tablet 1   No current facility-administered medications on file prior to visit.    No Known Allergies     Physical Exam Vitals requested from patient and listed below if patient had equipment and was able to obtain at home for this virtual visit: There were no vitals filed for this visit. Estimated body mass index is 31.04 kg/m as calculated from the following:   Height as of 02/13/24: 5' 3.5 (1.613 m).   Weight as of 02/13/24: 178 lb (80.7 kg).  EKG (optional): deferred due to virtual visit  GENERAL: alert, oriented, no acute distress detected, full vision exam deferred due to pandemic and/or virtual encounter   HEENT: atraumatic, conjunttiva clear, no obvious abnormalities on inspection of external nose and ears  NECK: normal movements of the head and neck  LUNGS: on inspection no signs of respiratory distress, breathing rate appears normal, no obvious gross SOB, gasping or wheezing  CV: no obvious cyanosis  MS: moves all visible extremities without noticeable abnormality  PSYCH/NEURO: pleasant and cooperative, no obvious depression or anxiety, speech and thought processing grossly intact, Cognitive function grossly  intact  Flowsheet Row Clinical Support from 03/13/2024 [facility]  PHQ-9 Total Score 0        03/13/2024    3:28 PM 01/31/2023    1:02 PM 11/04/2022   11:40 AM 11/01/2022    1:01 PM 06/03/2022    2:07 PM  Depression screen PHQ 2/9  Decreased Interest 0 0 0 0 0  Down, Depressed, Hopeless 0 0 0 0 0  PHQ - 2 Score 0 0 0 0 0  Altered sleeping 0 1 0 0 0  Tired, decreased energy 0 0 0 1 0  Change in appetite 0 0 0 0 0  Feeling bad or failure about yourself  0 0 0 0 0  Trouble concentrating 0 0 0 0 0  Moving slowly or fidgety/restless 0 0 0 0 0  Suicidal thoughts 0 0 0 0 0  PHQ-9 Score 0 1 0 1 0  Difficult doing work/chores  Not difficult at all Not difficult at all Not difficult at all Not difficult at all       11/04/2022   11:34 AM 01/31/2023    1:01 PM 10/30/2023   11:59 PM 03/12/2024    8:06 AM 03/13/2024    3:28 PM  Fall Risk  Falls in the past year? 0 1 0 1 0  Was there an injury with Fall? 0 0  0 0  Fall Risk Category Calculator 0 1  1  0  Patient at Risk for Falls Due to  No Fall Risks   No Fall Risks  Fall risk Follow up Falls evaluation completed;Education provided;Falls prevention discussed Falls evaluation completed   Falls evaluation completed     Patient-reported     SUMMARY AND PLAN:  Encounter for Medicare annual wellness exam    Discussed applicable health maintenance/preventive health measures and advised and referred or ordered per patient preferences: -discussed vaccines due, can get at office or at pharmacy, advised to let us  know if she does so that we can update her record Health Maintenance  Topic Date Due   INFLUENZA VACCINE  03/25/2024 (Originally 02/24/2024)   Fecal DNA (Cologuard)  10/06/2024   MAMMOGRAM  03/01/2025   Medicare Annual Wellness (AWV)  03/13/2025   DTaP/Tdap/Td (3 - Td or Tdap) 05/21/2025   Pneumococcal Vaccine: 50+ Years  Completed   DEXA SCAN  Completed   Hepatitis C Screening  Completed   Zoster  Vaccines- Shingrix  Completed   HPV VACCINES  Aged Out   Meningococcal B Vaccine  Aged Out   Pneumococcal Vaccine  Discontinued   COVID-19 Vaccine  Discontinued      Education and counseling on the following was provided based on the above review of health and a plan/checklist for the patient, along with additional information discussed, was provided for the patient in the patient instructions :    -Advised and counseled on a healthy lifestyle  -Reviewed patient's current diet. Advised and counseled on a whole foods based healthy diet. A summary of a healthy diet was provided in the Patient Instructions. Discussed ensuring bread is a true whole grain bread, healthier snacks, limiting juice -reviewed patient's current physical activity level and discussed exercise guidelines for adults. Discussed safe exercise at home to assist in meeting exercise guideline recommendations in a safe and healthy way. Further resources provided in patient instructions.  -Advise yearly dental visits at minimum and regular eye exams -Advised and counseled on alcohol safe limits, risks/ tobacco use, risks of smoking and offered counseling/help, drug, opoid use/misuse   Follow up: see patient instructions     Patient Instructions  I really enjoyed getting to talk with you today! I am available on Tuesdays and Thursdays for virtual visits if you have any questions or concerns, or if I can be of any further assistance.   CHECKLIST FROM ANNUAL WELLNESS VISIT:  -Follow up (please call to schedule if not scheduled after visit):   -yearly for annual wellness visit with primary care office  Here is a list of your preventive care/health maintenance measures and the plan for each if any are due:  PLAN For any measures below that may be  due:   Health Maintenance  Topic Date Due   INFLUENZA VACCINE  03/25/2024 (Originally 02/24/2024)   Fecal DNA (Cologuard)  10/06/2024   MAMMOGRAM  03/01/2025   Medicare Annual  Wellness (AWV)  03/13/2025   DTaP/Tdap/Td (3 - Td or Tdap) 05/21/2025   Pneumococcal Vaccine: 50+ Years  Completed   DEXA SCAN  Completed   Hepatitis C Screening  Completed   Zoster Vaccines- Shingrix  Completed   HPV VACCINES  Aged Out   Meningococcal B Vaccine  Aged Out   Pneumococcal Vaccine  Discontinued   COVID-19 Vaccine  Discontinued    -See a dentist at least yearly  -Get your eyes checked and then per your eye specialist's recommendations  -Other issues addressed today:   -I have included below further information regarding a healthy whole foods based diet, physical activity guidelines for adults, stress management and opportunities for social connections. I hope you find this information useful.   -----------------------------------------------------------------------------------------------------------------------------------------------------------------------------------------------------------------------------------------------------------    NUTRITION: -eat real food: lots of colorful vegetables (half the plate) and fruits -5-7 servings of vegetables and fruits per day (fresh or steamed is best), exp. 2 servings of vegetables with lunch and dinner and 2 servings of fruit per day. Berries and greens such as kale and collards are great choices.  -consume on a regular basis:  fresh fruits, fresh veggies, fish, nuts, seeds, healthy oils (such as olive oil, avocado oil), whole grains (make sure for bread/pasta/crackers/etc., that the first ingredient on label contains the word whole), legumes. -can eat small amounts of dairy and lean meat (no larger than the palm of your hand), but avoid processed meats such as ham, bacon, lunch meat, etc. -drink water -try to avoid fast food and pre-packaged foods, processed meat, ultra processed foods/beverages (donuts, candy, etc.) -most experts advise limiting sodium to < 2300mg  per day, should limit further is any chronic conditions  such as high blood pressure, heart disease, diabetes, etc. The American Heart Association advised that < 1500mg  is is ideal -try to avoid foods/beverages that contain any ingredients with names you do not recognize  -try to avoid foods/beverages  with added sugar or sweeteners/sweets  -try to avoid sweet drinks (including diet drinks): soda, juice, Gatorade, sweet tea, power drinks, diet drinks -try to avoid white rice, white bread, pasta (unless whole grain)  EXERCISE GUIDELINES FOR ADULTS: -if you wish to increase your physical activity, do so gradually and with the approval of your doctor -STOP and seek medical care immediately if you have any chest pain, chest discomfort or trouble breathing when starting or increasing exercise  -move and stretch your body, legs, feet and arms when sitting for long periods -Physical activity guidelines for optimal health in adults: -get at least 150 minutes per week of moderate exercise (can talk, but not sing); this is about 20-30 minutes of sustained activity 5-7 days per week or two 10-15 minute episodes of sustained activity 5-7 days per week -do some muscle building/resistance training/strength training at least 2 days per week  -balance exercises 3+ days per week:   Stand somewhere where you have something sturdy to hold onto if you lose balance    1) lift up on toes, then back down, start with 5x per day and work up to 20x   2) stand and lift one leg straight out to the side so that foot is a few inches of the floor, start with 5x each side and work up to 20x each side   3) stand  on one foot, start with 5 seconds each side and work up to 20 seconds on each side  If you need ideas or help with getting more active:  -Silver sneakers https://tools.silversneakers.com  -Walk with a Doc: http://www.duncan-williams.com/  -try to include resistance (weight lifting/strength building) and balance exercises twice per week: or the following link for  ideas: http://castillo-powell.com/  BuyDucts.dk  STRESS MANAGEMENT: -can try meditating, or just sitting quietly with deep breathing while intentionally relaxing all parts of your body for 5 minutes daily -if you need further help with stress, anxiety or depression please follow up with your primary doctor or contact the wonderful folks at [facility]: [phone]  SOCIAL CONNECTIONS: -options in [place] if you wish to engage in more social and exercise related activities:  -Silver sneakers https://tools.silversneakers.com  -Walk with a Doc: http://www.duncan-williams.com/  -Check out the [facility] Active Adults 50+ section on the [place] [employer] (hiking clubs, book clubs, cards and games, chess, exercise classes, aquatic classes and much more) - see the website for details: https://www.[place].gov/departments/parks-recreation/active-adults50  -YouTube has lots of exercise videos for different ages and abilities as well  -[facility] (a variety of indoor and outdoor inperson activities for adults). 8152201911. [address]  -Virtual Online Classes (a variety of topics): see seniorplanet.org or call [phone]  -consider volunteering at a school, hospice center, church, senior center or elsewhere            Chiquita JONELLE Cramp, DO

## 2024-03-13 NOTE — Progress Notes (Signed)
 Patient was unable to self-report due to a lack of equipment at home via telehealth

## 2024-06-13 DIAGNOSIS — Z23 Encounter for immunization: Secondary | ICD-10-CM | POA: Diagnosis not present

## 2024-06-14 ENCOUNTER — Other Ambulatory Visit: Payer: Self-pay | Admitting: Family Medicine

## 2024-06-14 DIAGNOSIS — I10 Essential (primary) hypertension: Secondary | ICD-10-CM

## 2024-08-01 NOTE — Progress Notes (Signed)
 "  Office Visit Note  Patient: Frances Hunter             Date of Birth: Jan 27, 1951           MRN: [id number]             PCP: Billy Philippe JONELLE, NP Referring: Billy Philippe JONELLE, NP Visit Date: 08/15/2024 Occupation: Data Unavailable  Subjective:  Right knee pain  History of Present Illness: Frances Hunter is a 74 y.o. female with osteoarthritis and osteopenia.  She returns today after her last visit in July 2025.  She states her hands are doing okay currently.  She is not having much trouble with the right ring trigger finger.  She states she had a good response to right knee joint viscosupplement injections in August 2025.  The pain has recurred now.  She would like to have a cortisone injection.  She has difficulty sleeping at night.  She is also experiencing nocturnal pain.  She has been through some stressful time as she recently lost her husband after an accident.    Activities of Daily Living:  Patient reports morning stiffness for 0 minutes.   Patient Reports nocturnal pain.  Difficulty dressing/grooming: Denies Difficulty climbing stairs: Reports Difficulty getting out of chair: Denies Difficulty using hands for taps, buttons, cutlery, and/or writing: Denies  Review of Systems  Constitutional:  Positive for fatigue.  HENT:  Positive for mouth dryness. Negative for mouth sores.   Eyes:  Negative for dryness.  Respiratory:  Negative for shortness of breath.   Cardiovascular:  Negative for chest pain and palpitations.  Gastrointestinal:  Negative for blood in stool, constipation and diarrhea.  Endocrine: Negative for increased urination.  Genitourinary:  Negative for involuntary urination.  Musculoskeletal:  Positive for joint pain and joint pain. Negative for gait problem, joint swelling, myalgias, muscle weakness, morning stiffness, muscle tenderness and myalgias.  Skin:  Negative for color change, rash, hair loss and sensitivity to sunlight.   Allergic/Immunologic: Negative for susceptible to infections.  Neurological:  Negative for dizziness and headaches.  Hematological:  Negative for swollen glands.  Psychiatric/Behavioral:  Positive for sleep disturbance. Negative for depressed mood. The patient is not nervous/anxious.     PMFS History:  Patient Active Problem List   Diagnosis Date Noted   Obesity (BMI 30-39.9) 06/03/2022   Osteopenia    Osteopenia of thigh 07/07/2015   Chronic venous insufficiency 07/12/2014   Essential hypertension 12/10/2012   GERD (gastroesophageal reflux disease) 12/10/2012   Seasonal allergies 12/10/2012   Varicose veins of bilateral lower extremities with pain 08/24/2008    Past Medical History:  Diagnosis Date   Arthritis    GERD (gastroesophageal reflux disease)    Hypertension    Osteopenia    low Frax score, jan 2019   Seasonal allergies     Family History  Problem Relation Age of Onset   Liver disease Mother 47       cyst   Hypertension Father    Lymphoma Father 59       lymphoma   Hyperlipidemia Sister    Diabetes Brother    Hyperlipidemia Brother    Hypertension Brother    Diabetes Maternal Grandfather    Lymphoma Paternal Grandfather    Hypertension Daughter    Breast cancer Neg Hx    Past Surgical History:  Procedure Laterality Date   CATARACT EXTRACTION, BILATERAL  04/2023   TUBAL LIGATION     Social History[1] Social History   Social  History Narrative   Married. College: Yes. Exercise: Yes.     Immunization History  Administered Date(s) Administered   Fluad Quad(high Dose 65+) 03/27/2019, 03/27/2020, 03/27/2021   Fluad Trivalent(High Dose 65+) 05/09/2023   INFLUENZA, HIGH DOSE SEASONAL PF 09/25/2018, 03/26/2020, 06/03/2022   Influenza,inj,Quad PF,6+ Mos 05/17/2014, 05/22/2015, 05/27/2016, 06/11/2017   Moderna Covid-19 Fall Seasonal Vaccine 55yrs & older 05/09/2023   Pneumococcal Conjugate-13 05/27/2016   Pneumococcal Polysaccharide-23 02/23/2010,  06/11/2017   Tdap 02/24/2007, 05/22/2015   Zoster Recombinant(Shingrix) 05/14/2021, 09/17/2021     Objective: Vital Signs: BP 136/73   Pulse 86   Temp 98.3 F (36.8 C)   Resp 14   Ht 5' 3.5 (1.613 m)   Wt 168 lb (76.2 kg)   BMI 29.29 kg/m    Physical Exam Vitals and nursing note reviewed.  Constitutional:      Appearance: She is well-developed.  HENT:     Head: Normocephalic and atraumatic.  Eyes:     Conjunctiva/sclera: Conjunctivae normal.  Cardiovascular:     Rate and Rhythm: Normal rate and regular rhythm.     Heart sounds: Normal heart sounds.  Pulmonary:     Effort: Pulmonary effort is normal.     Breath sounds: Normal breath sounds.  Abdominal:     General: Bowel sounds are normal.     Palpations: Abdomen is soft.  Musculoskeletal:     Cervical back: Normal range of motion.  Lymphadenopathy:     Cervical: No cervical adenopathy.  Skin:    General: Skin is warm and dry.     Capillary Refill: Capillary refill takes less than 2 seconds.  Neurological:     Mental Status: She is alert and oriented to person, place, and time.  Psychiatric:        Behavior: Behavior normal.      Musculoskeletal Exam: Cervical, thoracic and lumbar spine were in good range of motion.  There was no SI joint tenderness.  Shoulder joints, elbow joints, wrist joints, MCPs, PIPs and DIPs were in good range of motion with no synovitis.  Bilateral PIP and DIP thickening was noted.  Hip joints and knee joints were in good range of motion without any warmth swelling or effusion.  There was no tenderness over ankles or MTPs.   CDAI Exam: CDAI Score: -- Patient Global: --; Provider Global: -- Swollen: --; Tender: -- Joint Exam 08/15/2024   No joint exam has been documented for this visit   There is currently no information documented on the homunculus. Go to the Rheumatology activity and complete the homunculus joint exam.  Investigation: No additional findings.  Imaging: No  results found.  Recent Labs: Lab Results  Component Value Date   WBC 5.4 08/06/2024   HGB 12.6 08/06/2024   PLT 247.0 08/06/2024   NA 140 08/06/2024   K 4.0 08/06/2024   CL 104 08/06/2024   CO2 31 08/06/2024   GLUCOSE 90 08/06/2024   BUN 19 08/06/2024   CREATININE 0.76 08/06/2024   BILITOT 0.5 08/06/2024   ALKPHOS 55 08/06/2024   AST 18 08/06/2024   ALT 14 08/06/2024   PROT 7.6 08/06/2024   ALBUMIN 4.1 08/06/2024   CALCIUM 9.4 08/06/2024   GFRAA 88 03/27/2020    Speciality Comments: No specialty comments available.  Procedures:  Large Joint Inj: R knee on 08/15/2024 11:44 AM Indications: pain Details: 27 G 1.5 in needle, medial approach  Arthrogram: No  Medications: 40 mg triamcinolone  acetonide 40 MG/ML; 1.5 mL lidocaine  1 % Aspirate:  0 mL Outcome: tolerated well, no immediate complications  Risk of infection, tendon injury, nerve injury, hypopigmentation and dermal atrophy were discussed. Procedure, treatment alternatives, risks and benefits explained, specific risks discussed. Consent was given by the patient. Immediately prior to procedure a time out was called to verify the correct patient, procedure, equipment, support staff and site/side marked as required. Patient was prepped and draped in the usual sterile fashion.     Allergies: Patient has no known allergies.   Assessment / Plan:     Visit Diagnoses: Primary osteoarthritis of both hands-she has bilateral PIP and DIP thickening.  She continues to have some stiffness in her hands.  Joint protection muscle strengthening was discussed.  Trigger finger, right ring finger - Injected September 22, 2022.  She denies any recent symptoms.  Primary osteoarthritis of both knees - Status post right knee injection August 26, 2023.  Patient had viscosupplement injections in August 2025.  She wants to have repeat cortisone injection.  After informed consent was obtained and side effects were discussed right knee joint was  prepped in sterile fashion injected with lidocaine  and Kenalog  as described above.  Patient tolerated the procedure well.  Handout on lower extremity muscle strengthening exercises was given.  Osteopenia of neck of right femur - June 10, 2023 DEXA scan: The BMD measured at Femur Neck Right is 0.807 g/cm2 with a T-score of -1.7.  Calcium rich diet and vitamin D  was advised.  Will repeat DEXA scan in November 2026.  Essential hypertension-blood pressure was elevated at 158/68.  Repeat blood pressure was 136/73.  Patient states her blood pressure has been running high.  She plans to follow-up with her PCP.  Varicose veins of bilateral lower extremities with pain  Gastroesophageal reflux disease without esophagitis  Seasonal allergies  Grieving-patient recently lost her husband and going through the grieving process. Orders: Orders Placed This Encounter  Procedures   Large Joint Inj   No orders of the defined types were placed in this encounter.    Follow-Up Instructions: Return in about 6 months (around 02/12/2025) for Osteoarthritis.   Maya Nash, MD  Note - This record has been created using Animal nutritionist.  Chart creation errors have been sought, but may not always  have been located. Such creation errors do not reflect on  the standard of medical care.     [1]  Social History Tobacco Use   Smoking status: Never    Passive exposure: Never   Smokeless tobacco: Never  Vaping Use   Vaping status: Never Used  Substance Use Topics   Alcohol use: Yes    Alcohol/week: 1.0 standard drink of alcohol    Types: 1 Glasses of wine per week    Comment: 2 x a month   Drug use: No   "

## 2024-08-02 ENCOUNTER — Ambulatory Visit: Admitting: Family Medicine

## 2024-08-02 NOTE — Progress Notes (Unsigned)
" ° °  Established Patient Office Visit   Subjective:  Patient ID: KIARRAH RAUSCH, female    DOB: [birth year]  Age: 74 y.o. MRN: [id number]  No chief complaint on file.   HPI HTN: Chronic. Patient takes Lisinopriol/HCTZ 10-12.5mg  daily. Patient reports she monitors her blood pressure at home. Ranges around 138/70s Denies chest pain, shortness of breath, headaches, dizziness, or lower extremity edema. Once or twice a month will have lightheadedness. Contributes to not eating as much or not drinking water as much.    Seasonal allergies: Chronic. Patient is prescribed Cetirizine  10mg  tablet as needed. Not took in a while. Also, using Flonase  as needed.  ROS See HPI above     Objective:     There were no vitals taken for this visit. {Vitals History (Optional):23777}  Physical Exam  No results found for any visits on 08/02/24.  The 10-year ASCVD risk score (Arnett DK, et al., 2019) is: 23.7%    Assessment & Plan:  Essential hypertension  Seasonal allergies  1.Review health maintenance:    No follow-ups on file.   Magdelyn Roebuck, NP "

## 2024-08-06 ENCOUNTER — Ambulatory Visit (INDEPENDENT_AMBULATORY_CARE_PROVIDER_SITE_OTHER): Admitting: Family Medicine

## 2024-08-06 ENCOUNTER — Encounter: Payer: Self-pay | Admitting: Family Medicine

## 2024-08-06 VITALS — BP 148/70 | HR 91 | Temp 97.9°F | Ht 63.5 in | Wt 175.0 lb

## 2024-08-06 DIAGNOSIS — I10 Essential (primary) hypertension: Secondary | ICD-10-CM

## 2024-08-06 DIAGNOSIS — Z6830 Body mass index (BMI) 30.0-30.9, adult: Secondary | ICD-10-CM

## 2024-08-06 DIAGNOSIS — R7303 Prediabetes: Secondary | ICD-10-CM

## 2024-08-06 DIAGNOSIS — E6609 Other obesity due to excess calories: Secondary | ICD-10-CM

## 2024-08-06 DIAGNOSIS — E66811 Obesity, class 1: Secondary | ICD-10-CM | POA: Diagnosis not present

## 2024-08-06 DIAGNOSIS — J302 Other seasonal allergic rhinitis: Secondary | ICD-10-CM

## 2024-08-06 LAB — COMPREHENSIVE METABOLIC PANEL WITH GFR
ALT: 14 U/L (ref 3–35)
AST: 18 U/L (ref 5–37)
Albumin: 4.1 g/dL (ref 3.5–5.2)
Alkaline Phosphatase: 55 U/L (ref 39–117)
BUN: 19 mg/dL (ref 6–23)
CO2: 31 meq/L (ref 19–32)
Calcium: 9.4 mg/dL (ref 8.4–10.5)
Chloride: 104 meq/L (ref 96–112)
Creatinine, Ser: 0.76 mg/dL (ref 0.40–1.20)
GFR: 77.55 mL/min
Glucose, Bld: 90 mg/dL (ref 70–99)
Potassium: 4 meq/L (ref 3.5–5.1)
Sodium: 140 meq/L (ref 135–145)
Total Bilirubin: 0.5 mg/dL (ref 0.2–1.2)
Total Protein: 7.6 g/dL (ref 6.0–8.3)

## 2024-08-06 LAB — LIPID PANEL
Cholesterol: 146 mg/dL (ref 28–200)
HDL: 63.5 mg/dL
LDL Cholesterol: 73 mg/dL (ref 10–99)
NonHDL: 82.95
Total CHOL/HDL Ratio: 2
Triglycerides: 49 mg/dL (ref 10.0–149.0)
VLDL: 9.8 mg/dL (ref 0.0–40.0)

## 2024-08-06 LAB — CBC WITH DIFFERENTIAL/PLATELET
Basophils Absolute: 0 K/uL (ref 0.0–0.1)
Basophils Relative: 0.8 % (ref 0.0–3.0)
Eosinophils Absolute: 0 K/uL (ref 0.0–0.7)
Eosinophils Relative: 0.6 % (ref 0.0–5.0)
HCT: 37.6 % (ref 36.0–46.0)
Hemoglobin: 12.6 g/dL (ref 12.0–15.0)
Lymphocytes Relative: 36 % (ref 12.0–46.0)
Lymphs Abs: 1.9 K/uL (ref 0.7–4.0)
MCHC: 33.5 g/dL (ref 30.0–36.0)
MCV: 90.8 fl (ref 78.0–100.0)
Monocytes Absolute: 0.4 K/uL (ref 0.1–1.0)
Monocytes Relative: 8.3 % (ref 3.0–12.0)
Neutro Abs: 2.9 K/uL (ref 1.4–7.7)
Neutrophils Relative %: 54.3 % (ref 43.0–77.0)
Platelets: 247 K/uL (ref 150.0–400.0)
RBC: 4.15 Mil/uL (ref 3.87–5.11)
RDW: 13.2 % (ref 11.5–15.5)
WBC: 5.4 K/uL (ref 4.0–10.5)

## 2024-08-06 LAB — HEMOGLOBIN A1C: Hgb A1c MFr Bld: 5.6 % (ref 4.6–6.5)

## 2024-08-06 NOTE — Patient Instructions (Addendum)
-  It was nice to see you this morning.  -Blood pressure is elevated, could be from just taking medication. Recommend to monitor blood pressure at home, twice a day, for about a week. Send a MyChart message with home readings. If consistently above 140/90, will need to increase medication.  -Continue all other medications.  -Follow up in 6 months for chronic management.

## 2024-08-06 NOTE — Assessment & Plan Note (Signed)
-  Blood pressure is elevated, could be from just taking medication. Recommend to monitor blood pressure at home, twice a day, for about a week. Send a MyChart message with home readings. If consistently above 140/90, will need to increase medication.

## 2024-08-06 NOTE — Progress Notes (Signed)
 "  Established Patient Office Visit   Subjective:  Patient ID: Frances Hunter, female    DOB: 05-29-1951  Age: 74 y.o. MRN: [id number]  Chief Complaint  Patient presents with   Medical Management of Chronic Issues    HPI HTN: Chronic. Patient takes Lisinopriol/HCTZ 10-12.5mg  daily. Patient reports she monitors her blood pressure at home. Ranges within the normal range. She reports she just had her medication right before coming in. Denies chest pain, shortness of breath, headaches, dizziness, lightheadedness, or lower extremity edema.   BP Readings from Last 3 Encounters:  08/06/24 (!) 150/68  02/13/24 (!) 158/70  10/31/23 134/70     Seasonal allergies: Chronic. Patient is prescribed Cetirizine  10mg  tablet as needed. Also, using Flonase  as needed.  ROS See HPI above     Objective:   BP (!) 150/68   Pulse 91   Temp 97.9 F (36.6 C) (Oral)   Ht 5' 3.5 (1.613 m)   Wt 175 lb (79.4 kg)   SpO2 96%   BMI 30.51 kg/m    Physical Exam Vitals reviewed.  Constitutional:      General: She is not in acute distress.    Appearance: Normal appearance. She is obese. She is not ill-appearing, toxic-appearing or diaphoretic.  HENT:     Head: Normocephalic and atraumatic.  Eyes:     General:        Right eye: No discharge.        Left eye: No discharge.     Conjunctiva/sclera: Conjunctivae normal.  Cardiovascular:     Rate and Rhythm: Normal rate and regular rhythm.     Heart sounds: Normal heart sounds. No murmur heard.    No friction rub. No gallop.  Pulmonary:     Effort: Pulmonary effort is normal. No respiratory distress.     Breath sounds: Normal breath sounds.  Musculoskeletal:        General: Normal range of motion.  Skin:    General: Skin is warm and dry.  Neurological:     General: No focal deficit present.     Mental Status: She is alert and oriented to person, place, and time. Mental status is at baseline.  Psychiatric:        Mood and Affect: Mood normal.         Behavior: Behavior normal.        Thought Content: Thought content normal.        Judgment: Judgment normal.      Assessment & Plan:  Essential hypertension Assessment & Plan: -Blood pressure is elevated, could be from just taking medication. Recommend to monitor blood pressure at home, twice a day, for about a week. Send a MyChart message with home readings. If consistently above 140/90, will need to increase medication.   Orders: -     Comprehensive metabolic panel with GFR  Seasonal allergies Assessment & Plan: Stable. Continue with Cetirizine  10mg  daily and Flonase  as needed.    Prediabetes -     Hemoglobin A1c  Class 1 obesity due to excess calories with serious comorbidity and body mass index (BMI) of 30.0 to 30.9 in adult -     CBC with Differential/Platelet -     Comprehensive metabolic panel with GFR -     Hemoglobin A1c -     Lipid panel  1.Review health maintenance: -Influenza vaccine: 06/13/2024 at [facility]  -Covid booster: 06/13/2024 at [facility]  2. Ordered labs based on history of prediabetes and BMI-obesity.  Return in  about 6 months (around 02/03/2025) for chronic management.   Aalani Aikens, NP "

## 2024-08-06 NOTE — Assessment & Plan Note (Signed)
 Stable. Continue with Cetirizine  10mg  daily and Flonase  as needed.

## 2024-08-07 ENCOUNTER — Ambulatory Visit: Payer: Self-pay | Admitting: Family Medicine

## 2024-08-15 ENCOUNTER — Ambulatory Visit: Attending: Rheumatology | Admitting: Rheumatology

## 2024-08-15 ENCOUNTER — Encounter: Payer: Self-pay | Admitting: Rheumatology

## 2024-08-15 VITALS — BP 136/73 | HR 86 | Temp 98.3°F | Resp 14 | Ht 63.5 in | Wt 168.0 lb

## 2024-08-15 DIAGNOSIS — J302 Other seasonal allergic rhinitis: Secondary | ICD-10-CM

## 2024-08-15 DIAGNOSIS — M19042 Primary osteoarthritis, left hand: Secondary | ICD-10-CM

## 2024-08-15 DIAGNOSIS — K219 Gastro-esophageal reflux disease without esophagitis: Secondary | ICD-10-CM

## 2024-08-15 DIAGNOSIS — M19041 Primary osteoarthritis, right hand: Secondary | ICD-10-CM

## 2024-08-15 DIAGNOSIS — M1711 Unilateral primary osteoarthritis, right knee: Secondary | ICD-10-CM

## 2024-08-15 DIAGNOSIS — F4321 Adjustment disorder with depressed mood: Secondary | ICD-10-CM

## 2024-08-15 DIAGNOSIS — I83813 Varicose veins of bilateral lower extremities with pain: Secondary | ICD-10-CM | POA: Diagnosis not present

## 2024-08-15 DIAGNOSIS — M85851 Other specified disorders of bone density and structure, right thigh: Secondary | ICD-10-CM

## 2024-08-15 DIAGNOSIS — M65341 Trigger finger, right ring finger: Secondary | ICD-10-CM

## 2024-08-15 DIAGNOSIS — I10 Essential (primary) hypertension: Secondary | ICD-10-CM

## 2024-08-15 DIAGNOSIS — M170 Bilateral primary osteoarthritis of knee: Secondary | ICD-10-CM | POA: Diagnosis not present

## 2024-08-15 MED ORDER — LIDOCAINE HCL 1 % IJ SOLN
1.5000 mL | INTRAMUSCULAR | Status: AC | PRN
  Administered 2024-08-15: 1.5 mL

## 2024-08-15 MED ORDER — TRIAMCINOLONE ACETONIDE 40 MG/ML IJ SUSP
40.0000 mg | INTRAMUSCULAR | Status: AC | PRN
  Administered 2024-08-15: 40 mg via INTRA_ARTICULAR

## 2024-08-15 NOTE — Patient Instructions (Signed)
 Exercises for Chronic Knee Pain  Chronic knee pain is pain that lasts longer than 3 months. For most people with chronic knee pain, exercise and weight loss is an important part of treatment. Your health care provider may want you to focus on:  Making the muscles that support your knee stronger. This can take pressure off your knee and reduce pain.  Preventing knee stiffness.  How far you can move your knee, keeping it there or making it farther.  Losing weight (if this applies) to take pressure off your knee, lower your risk for injury, and make it easier for you to exercise.  Your provider will help you make an exercise program that fits your needs and physical abilities. Below are simple, low-impact exercises you can do at home. Ask your provider or physical therapist how often you should do your exercise program and how many times to repeat each exercise.  General safety tips    Get your provider's approval before doing any exercises.  Start slowly and stop any time you feel pain.  Do not exercise if your knee pain is flaring up.  Warm up first. Stretching a cold muscle can cause an injury. Do 5-10 minutes of easy movement or light stretching before beginning your exercises.  Do 5-10 minutes of low-impact activity (like walking or cycling) before starting strengthening exercises.  Contact your provider any time you have pain during or after exercising. Exercise can cause discomfort but should not be painful. It is normal to be a little stiff or sore after exercising.  Stretching and range-of-motion exercises  Front thigh stretch    Stand up straight and support your body by holding on to a chair or resting one hand on a Mastandrea.  With your legs straight and close together, bend one knee to lift your heel up toward your butt.  Using one hand for support, grab your ankle with your free hand.  Pull your foot up closer toward your butt to feel the stretch in front of your thigh.  Hold the stretch for 30  seconds.  Repeat __________ times. Complete this exercise __________ times a day.  Back thigh stretch    Sit on the floor with your back straight and your legs out straight in front of you.  Place the palms of your hands on the floor and slide them toward your feet as you bend at the hip.  Try to touch your nose to your knees and feel the stretch in the back of your thighs.  Hold for 30 seconds.  Repeat __________ times. Complete this exercise __________ times a day.  Calf stretch    Stand facing a Sheehy.  Place the palms of your hands flat against the Ghazi, arms extended, and lean slightly against the Muha.  Get into a lunge position with one leg bent at the knee and the other leg stretched out straight behind you.  Keep both feet facing the Ziesmer and increase the bend in your knee while keeping the heel of the other leg flat on the ground.  You should feel the stretch in your calf. Hold for 30 seconds.  Repeat __________ times. Complete this exercise __________ times a day.  Strengthening exercises  Straight leg lift    Lie on your back with one knee bent and the other leg out straight.  Slowly lift the straight leg without bending the knee.  Lift until your foot is about 12 inches (30 cm) off the floor.  Hold for  3-5 seconds and slowly lower your leg.  Repeat __________ times. Complete this exercise __________ times a day.  Single leg dip    Stand between two chairs and put both hands on the backs of the chairs for support.  Extend one leg out straight with your body weight resting on the heel of the standing leg.  Slowly bend your standing knee to dip your body to the level that is comfortable for you.  Hold for 3-5 seconds.  Repeat __________ times. Complete this exercise __________ times a day.  Hamstring curls    Stand straight, knees close together, facing the back of a chair.  Hold on to the back of a chair with both hands.  Keep one leg straight. Bend the other knee while bringing the heel up toward the butt  until the knee is bent at a 90-degree angle (right angle).  Hold for 3-5 seconds.  Repeat __________ times. Complete this exercise __________ times a day.  Neece squat    Stand straight with your back, hips, and head against a Rushing.  Step forward one foot at a time with your back still against the Spargur.  Your feet should be 2 feet (61 cm) from the Jeanpaul at shoulder width. Keeping your back, hips, and head against the Colligan, slide down the Brenner to as close to a sitting position as you can get.  Hold for 5-10 seconds, then slowly slide back up.  Repeat __________ times. Complete this exercise __________ times a day.  Step-ups    Stand in front of a sturdy platform or stool that is about 6 inches (15 cm) high.  Slowly step up with your left / right foot, keeping your knee in line with your hip and foot. Do not let your knee bend so far that you cannot see your toes. Hold on to a chair for balance, but do not use it for support.  Slowly unlock your knee and lower yourself to the starting position.  Repeat __________ times. Complete this exercise __________ times a day.  Contact a health care provider if:  Your exercises cause pain.  Your pain is worse after you exercise.  Your pain prevents you from doing your exercises.  This information is not intended to replace advice given to you by your health care provider. Make sure you discuss any questions you have with your health care provider.  Document Revised: 07/27/2022 Document Reviewed: 07/27/2022  Elsevier Patient Education  2024 [employer].

## 2024-08-31 ENCOUNTER — Encounter: Payer: Self-pay | Admitting: Family Medicine

## 2025-02-04 ENCOUNTER — Ambulatory Visit: Admitting: Family Medicine

## 2025-02-12 ENCOUNTER — Ambulatory Visit: Admitting: Rheumatology
